# Patient Record
Sex: Female | Born: 1945 | Race: White | Hispanic: No | Marital: Married | Smoking: Never smoker
Health system: Southern US, Community
[De-identification: ages and names within clinical notes are randomized; demographics above are authoritative.]

## PROBLEM LIST (undated history)

## (undated) DIAGNOSIS — E119 Type 2 diabetes mellitus without complications: Secondary | ICD-10-CM

## (undated) DIAGNOSIS — E785 Hyperlipidemia, unspecified: Secondary | ICD-10-CM

## (undated) DIAGNOSIS — K219 Gastro-esophageal reflux disease without esophagitis: Secondary | ICD-10-CM

## (undated) DIAGNOSIS — I10 Essential (primary) hypertension: Secondary | ICD-10-CM

## (undated) DIAGNOSIS — N289 Disorder of kidney and ureter, unspecified: Secondary | ICD-10-CM

## (undated) HISTORY — PX: VAGINAL HYSTERECTOMY: SUR661

## (undated) HISTORY — DX: Hyperlipidemia, unspecified: E78.5

## (undated) HISTORY — DX: Essential (primary) hypertension: I10

## (undated) HISTORY — DX: Gastro-esophageal reflux disease without esophagitis: K21.9

## (undated) HISTORY — PX: FOOT SURGERY: SHX648

## (undated) HISTORY — DX: Type 2 diabetes mellitus without complications: E11.9

## (undated) HISTORY — PX: INCONTINENCE SURGERY: SHX676

---

## 2005-01-09 ENCOUNTER — Ambulatory Visit: Payer: Self-pay | Admitting: Oncology

## 2005-01-10 ENCOUNTER — Other Ambulatory Visit: Payer: Self-pay

## 2005-01-11 ENCOUNTER — Ambulatory Visit: Payer: Self-pay | Admitting: Obstetrics and Gynecology

## 2005-01-12 ENCOUNTER — Ambulatory Visit: Payer: Self-pay | Admitting: Obstetrics and Gynecology

## 2005-01-20 ENCOUNTER — Ambulatory Visit: Payer: Self-pay | Admitting: Oncology

## 2005-05-25 ENCOUNTER — Ambulatory Visit: Payer: Self-pay | Admitting: Gastroenterology

## 2006-01-08 ENCOUNTER — Ambulatory Visit: Payer: Self-pay | Admitting: Oncology

## 2006-01-20 ENCOUNTER — Ambulatory Visit: Payer: Self-pay | Admitting: Oncology

## 2006-08-10 ENCOUNTER — Other Ambulatory Visit: Payer: Self-pay

## 2006-08-10 ENCOUNTER — Emergency Department: Payer: Self-pay | Admitting: Emergency Medicine

## 2007-01-06 ENCOUNTER — Ambulatory Visit: Payer: Self-pay | Admitting: Oncology

## 2007-01-21 ENCOUNTER — Ambulatory Visit: Payer: Self-pay | Admitting: Oncology

## 2007-07-24 HISTORY — PX: COLONOSCOPY: SHX174

## 2007-08-14 ENCOUNTER — Ambulatory Visit: Payer: Self-pay | Admitting: Family Medicine

## 2008-01-21 ENCOUNTER — Ambulatory Visit: Payer: Self-pay | Admitting: Internal Medicine

## 2008-01-30 ENCOUNTER — Ambulatory Visit: Payer: Self-pay | Admitting: Internal Medicine

## 2008-02-21 ENCOUNTER — Ambulatory Visit: Payer: Self-pay | Admitting: Internal Medicine

## 2008-07-23 ENCOUNTER — Ambulatory Visit: Payer: Self-pay | Admitting: Internal Medicine

## 2008-07-25 ENCOUNTER — Emergency Department: Payer: Self-pay | Admitting: Emergency Medicine

## 2008-07-28 ENCOUNTER — Ambulatory Visit: Payer: Self-pay | Admitting: Internal Medicine

## 2008-08-17 ENCOUNTER — Ambulatory Visit: Payer: Self-pay

## 2008-08-23 ENCOUNTER — Ambulatory Visit: Payer: Self-pay | Admitting: Internal Medicine

## 2008-09-02 ENCOUNTER — Ambulatory Visit: Payer: Self-pay | Admitting: Family Medicine

## 2009-01-20 ENCOUNTER — Ambulatory Visit: Payer: Self-pay | Admitting: Internal Medicine

## 2009-02-08 ENCOUNTER — Ambulatory Visit: Payer: Self-pay | Admitting: Internal Medicine

## 2009-02-20 ENCOUNTER — Ambulatory Visit: Payer: Self-pay | Admitting: Internal Medicine

## 2009-07-23 ENCOUNTER — Ambulatory Visit: Payer: Self-pay | Admitting: Internal Medicine

## 2009-08-11 ENCOUNTER — Ambulatory Visit: Payer: Self-pay | Admitting: Internal Medicine

## 2009-08-18 ENCOUNTER — Ambulatory Visit: Payer: Self-pay

## 2009-08-23 ENCOUNTER — Ambulatory Visit: Payer: Self-pay | Admitting: Internal Medicine

## 2009-09-20 ENCOUNTER — Ambulatory Visit: Payer: Self-pay | Admitting: Internal Medicine

## 2010-03-01 ENCOUNTER — Ambulatory Visit: Payer: Self-pay | Admitting: Internal Medicine

## 2010-03-23 ENCOUNTER — Ambulatory Visit: Payer: Self-pay | Admitting: Internal Medicine

## 2010-08-30 ENCOUNTER — Ambulatory Visit: Payer: Self-pay | Admitting: Family Medicine

## 2010-09-01 ENCOUNTER — Ambulatory Visit: Payer: Self-pay | Admitting: Internal Medicine

## 2010-09-21 ENCOUNTER — Ambulatory Visit: Payer: Self-pay | Admitting: Internal Medicine

## 2011-08-01 ENCOUNTER — Ambulatory Visit: Payer: Self-pay | Admitting: Internal Medicine

## 2011-08-01 LAB — CBC CANCER CENTER
Basophil %: 1.2 %
Eosinophil %: 3.4 %
HGB: 13.5 g/dL (ref 12.0–16.0)
Lymphocyte #: 1.7 x10 3/mm (ref 1.0–3.6)
MCH: 30.7 pg (ref 26.0–34.0)
Monocyte #: 0.6 x10 3/mm (ref 0.0–0.7)
Monocyte %: 9.5 %
Neutrophil %: 60.8 %
Platelet: 121 x10 3/mm — ABNORMAL LOW (ref 150–440)

## 2011-08-24 ENCOUNTER — Ambulatory Visit: Payer: Self-pay | Admitting: Internal Medicine

## 2011-09-05 ENCOUNTER — Ambulatory Visit: Payer: Self-pay | Admitting: Family Medicine

## 2012-07-31 ENCOUNTER — Ambulatory Visit: Payer: Self-pay | Admitting: Internal Medicine

## 2012-08-04 LAB — CBC CANCER CENTER
Basophil #: 0.1 x10 3/mm (ref 0.0–0.1)
Lymphocyte #: 1.6 x10 3/mm (ref 1.0–3.6)
Lymphocyte %: 23.8 %
MCH: 29.8 pg (ref 26.0–34.0)
MCHC: 34.3 g/dL (ref 32.0–36.0)
MCV: 87 fL (ref 80–100)
RBC: 4.88 10*6/uL (ref 3.80–5.20)
RDW: 12.9 % (ref 11.5–14.5)
WBC: 6.7 x10 3/mm (ref 3.6–11.0)

## 2012-08-23 ENCOUNTER — Ambulatory Visit: Payer: Self-pay | Admitting: Internal Medicine

## 2012-10-14 ENCOUNTER — Ambulatory Visit: Payer: Self-pay | Admitting: Family Medicine

## 2013-04-21 ENCOUNTER — Ambulatory Visit: Payer: Self-pay | Admitting: Family Medicine

## 2013-07-09 ENCOUNTER — Ambulatory Visit: Payer: Self-pay | Admitting: Family Medicine

## 2013-08-06 ENCOUNTER — Ambulatory Visit: Payer: Self-pay | Admitting: Internal Medicine

## 2013-08-06 LAB — HEPATIC FUNCTION PANEL A (ARMC)
ALK PHOS: 79 U/L
Albumin: 3.5 g/dL (ref 3.4–5.0)
BILIRUBIN TOTAL: 0.6 mg/dL (ref 0.2–1.0)
Bilirubin, Direct: 0.3 mg/dL — ABNORMAL HIGH (ref 0.00–0.20)
SGOT(AST): 19 U/L (ref 15–37)
SGPT (ALT): 34 U/L (ref 12–78)
Total Protein: 7.7 g/dL (ref 6.4–8.2)

## 2013-08-06 LAB — CREATININE, SERUM: Creatinine: 0.87 mg/dL (ref 0.60–1.30)

## 2013-08-06 LAB — CBC CANCER CENTER
BASOS ABS: 0.1 x10 3/mm (ref 0.0–0.1)
Basophil %: 1.3 %
Eosinophil #: 0.2 x10 3/mm (ref 0.0–0.7)
Eosinophil %: 2.5 %
HCT: 42.4 % (ref 35.0–47.0)
HGB: 14.3 g/dL (ref 12.0–16.0)
LYMPHS ABS: 0.9 x10 3/mm — AB (ref 1.0–3.6)
LYMPHS PCT: 14.4 %
MCH: 29.7 pg (ref 26.0–34.0)
MCHC: 33.6 g/dL (ref 32.0–36.0)
MCV: 88 fL (ref 80–100)
MONOS PCT: 8.5 %
Monocyte #: 0.5 x10 3/mm (ref 0.2–0.9)
Neutrophil #: 4.7 x10 3/mm (ref 1.4–6.5)
Neutrophil %: 73.3 %
Platelet: 98 x10 3/mm — ABNORMAL LOW (ref 150–440)
RBC: 4.8 10*6/uL (ref 3.80–5.20)
RDW: 13 % (ref 11.5–14.5)
WBC: 6.4 x10 3/mm (ref 3.6–11.0)

## 2013-08-23 ENCOUNTER — Ambulatory Visit: Payer: Self-pay | Admitting: Internal Medicine

## 2013-10-15 ENCOUNTER — Ambulatory Visit: Payer: Self-pay | Admitting: Family Medicine

## 2014-08-05 ENCOUNTER — Ambulatory Visit: Payer: Self-pay | Admitting: Internal Medicine

## 2014-10-08 DIAGNOSIS — E784 Other hyperlipidemia: Secondary | ICD-10-CM | POA: Diagnosis not present

## 2014-10-08 DIAGNOSIS — E8881 Metabolic syndrome: Secondary | ICD-10-CM | POA: Diagnosis not present

## 2014-10-08 DIAGNOSIS — K219 Gastro-esophageal reflux disease without esophagitis: Secondary | ICD-10-CM | POA: Diagnosis not present

## 2014-10-08 DIAGNOSIS — I10 Essential (primary) hypertension: Secondary | ICD-10-CM | POA: Diagnosis not present

## 2014-10-08 DIAGNOSIS — E119 Type 2 diabetes mellitus without complications: Secondary | ICD-10-CM | POA: Diagnosis not present

## 2014-10-19 ENCOUNTER — Ambulatory Visit: Payer: Self-pay | Admitting: Family Medicine

## 2014-10-19 DIAGNOSIS — Z1231 Encounter for screening mammogram for malignant neoplasm of breast: Secondary | ICD-10-CM | POA: Diagnosis not present

## 2015-03-07 ENCOUNTER — Encounter: Payer: Self-pay | Admitting: Family Medicine

## 2015-03-07 ENCOUNTER — Ambulatory Visit (INDEPENDENT_AMBULATORY_CARE_PROVIDER_SITE_OTHER): Payer: Medicare Other | Admitting: Family Medicine

## 2015-03-07 VITALS — BP 130/80 | HR 68 | Ht 68.0 in | Wt 211.0 lb

## 2015-03-07 DIAGNOSIS — K469 Unspecified abdominal hernia without obstruction or gangrene: Secondary | ICD-10-CM | POA: Diagnosis not present

## 2015-03-07 DIAGNOSIS — K458 Other specified abdominal hernia without obstruction or gangrene: Secondary | ICD-10-CM

## 2015-03-07 NOTE — Progress Notes (Signed)
Name: Frances Harmon   MRN: [id number]    DOB: 03/27/1946   Date:03/07/2015       Progress Note  Subjective  Chief Complaint  No chief complaint on file.   Abdominal Pain This is a new problem. The current episode started 1 to 4 weeks ago. The onset quality is gradual. The problem occurs daily. The problem has been unchanged. The pain is located in the suprapubic region. The patient is experiencing no pain ("knot"). Pertinent negatives include no anorexia, arthralgias, belching, constipation, diarrhea, dysuria, fever, flatus, frequency, headaches, hematochezia, hematuria, melena, myalgias, nausea, vomiting or weight loss. The pain is aggravated by certain positions (noted while standing). Prior diagnostic workup includes surgery. Her past medical history is significant for abdominal surgery. There is no history of colon cancer, Crohn's disease, GERD, PUD or ulcerative colitis. hysterectomy    No problem-specific assessment & plan notes found for this encounter.   Past Medical History  Diagnosis Date  . GERD (gastroesophageal reflux disease)   . Diabetes mellitus without complication   . Hypertension   . Hyperlipidemia     Past Surgical History  Procedure Laterality Date  . Vaginal hysterectomy    . Foot surgery Bilateral     Bunion removal  . Incontinence surgery    . Colonoscopy  2009    repeat in 5 years- Dr Bary Leriche    Family History  Problem Relation Age of Onset  . Hypertension Brother   . Cancer Daughter     Social History   Social History  . Marital Status: Married    Spouse Name: N/A  . Number of Children: N/A  . Years of Education: N/A   Occupational History  . Not on file.   Social History Main Topics  . Smoking status: Never Smoker   . Smokeless tobacco: Not on file  . Alcohol Use: No  . Drug Use: No  . Sexual Activity: No   Other Topics Concern  . Not on file   Social History Narrative  . No narrative on file    No Known  Allergies   Review of Systems  Constitutional: Negative for fever, chills, weight loss and malaise/fatigue.  HENT: Negative for ear discharge, ear pain and sore throat.   Eyes: Negative for blurred vision.  Respiratory: Negative for cough, sputum production, shortness of breath and wheezing.   Cardiovascular: Negative for chest pain, palpitations and leg swelling.  Gastrointestinal: Negative for heartburn, nausea, vomiting, abdominal pain, diarrhea, constipation, blood in stool, melena, hematochezia, anorexia and flatus.       Feels "knot" in lower abdomin when standing  Genitourinary: Negative for dysuria, urgency, frequency and hematuria.  Musculoskeletal: Negative for myalgias, back pain, joint pain, arthralgias and neck pain.  Skin: Negative for rash.  Neurological: Negative for dizziness, tingling, sensory change, focal weakness and headaches.  Endo/Heme/Allergies: Negative for environmental allergies and polydipsia. Does not bruise/bleed easily.  Psychiatric/Behavioral: Negative for depression and suicidal ideas. The patient is not nervous/anxious and does not have insomnia.      Objective  Filed Vitals:   03/07/15 1500  BP: 130/80  Pulse: 68  Height: 5\' 8"  (1.727 m)  Weight: 211 lb (95.709 kg)    Physical Exam  Constitutional: She is well-developed, well-nourished, and in no distress. No distress.  HENT:  Head: Normocephalic and atraumatic.  Right Ear: External ear normal.  Left Ear: External ear normal.  Nose: Nose normal.  Mouth/Throat: Oropharynx is clear and moist.  Eyes: Conjunctivae and EOM are  normal. Pupils are equal, round, and reactive to light. Right eye exhibits no discharge. Left eye exhibits no discharge.  Neck: Normal range of motion. Neck supple. No JVD present. No thyromegaly present.  Cardiovascular: Normal rate, regular rhythm, normal heart sounds and intact distal pulses.  Exam reveals no gallop and no friction rub.   No murmur  heard. Pulmonary/Chest: Effort normal and breath sounds normal.  Abdominal: Soft. Bowel sounds are normal. She exhibits no mass. There is no tenderness. There is no guarding.  Palpable firmness with standing and valsalva  Musculoskeletal: Normal range of motion. She exhibits no edema.  Lymphadenopathy:    She has no cervical adenopathy.  Neurological: She is alert. She has normal reflexes.  Skin: Skin is warm and dry. She is not diaphoretic.  Psychiatric: Mood and affect normal.  Nursing note and vitals reviewed.     Assessment & Plan  Problem List Items Addressed This Visit    None    Visit Diagnoses    Other abdominal hernia    -  Primary    suprapubic area    Relevant Orders    Ambulatory referral to Gynecology         Dr. Otilio Miu [facility]  03/07/2015

## 2015-03-07 NOTE — Patient Instructions (Signed)

## 2015-03-22 DIAGNOSIS — K439 Ventral hernia without obstruction or gangrene: Secondary | ICD-10-CM | POA: Diagnosis not present

## 2015-03-29 DIAGNOSIS — K439 Ventral hernia without obstruction or gangrene: Secondary | ICD-10-CM | POA: Diagnosis not present

## 2015-03-30 ENCOUNTER — Other Ambulatory Visit: Payer: Self-pay | Admitting: Surgery

## 2015-03-30 DIAGNOSIS — R1084 Generalized abdominal pain: Secondary | ICD-10-CM

## 2015-03-30 DIAGNOSIS — K439 Ventral hernia without obstruction or gangrene: Secondary | ICD-10-CM

## 2015-04-06 ENCOUNTER — Ambulatory Visit
Admission: RE | Admit: 2015-04-06 | Discharge: 2015-04-06 | Disposition: A | Discharge status: Home / Self Care | Payer: Medicare Other | Source: Ambulatory Visit | Attending: Surgery | Admitting: Surgery

## 2015-04-06 DIAGNOSIS — I700 Atherosclerosis of aorta: Secondary | ICD-10-CM | POA: Diagnosis not present

## 2015-04-06 DIAGNOSIS — K5730 Diverticulosis of large intestine without perforation or abscess without bleeding: Secondary | ICD-10-CM | POA: Insufficient documentation

## 2015-04-06 DIAGNOSIS — K439 Ventral hernia without obstruction or gangrene: Secondary | ICD-10-CM | POA: Diagnosis not present

## 2015-04-06 DIAGNOSIS — R1084 Generalized abdominal pain: Secondary | ICD-10-CM | POA: Diagnosis not present

## 2015-04-06 MED ORDER — IOHEXOL 300 MG/ML  SOLN
100.0000 mL | Freq: Once | INTRAMUSCULAR | Status: AC | PRN
  Administered 2015-04-06: 100 mL via INTRAVENOUS

## 2015-04-16 ENCOUNTER — Other Ambulatory Visit: Payer: Self-pay | Admitting: Family Medicine

## 2015-04-16 DIAGNOSIS — E119 Type 2 diabetes mellitus without complications: Secondary | ICD-10-CM

## 2015-04-28 ENCOUNTER — Ambulatory Visit (INDEPENDENT_AMBULATORY_CARE_PROVIDER_SITE_OTHER): Payer: Medicare Other | Admitting: Family Medicine

## 2015-04-28 ENCOUNTER — Encounter: Payer: Self-pay | Admitting: Family Medicine

## 2015-04-28 ENCOUNTER — Encounter
Admission: RE | Admit: 2015-04-28 | Discharge: 2015-04-28 | Disposition: A | Discharge status: Home / Self Care | Payer: Medicare Other | Source: Ambulatory Visit | Attending: Surgery | Admitting: Surgery

## 2015-04-28 ENCOUNTER — Other Ambulatory Visit: Payer: Self-pay

## 2015-04-28 VITALS — BP 122/80 | HR 70 | Ht 68.0 in | Wt 204.0 lb

## 2015-04-28 DIAGNOSIS — K439 Ventral hernia without obstruction or gangrene: Secondary | ICD-10-CM | POA: Diagnosis not present

## 2015-04-28 DIAGNOSIS — K219 Gastro-esophageal reflux disease without esophagitis: Secondary | ICD-10-CM

## 2015-04-28 DIAGNOSIS — E119 Type 2 diabetes mellitus without complications: Secondary | ICD-10-CM | POA: Diagnosis not present

## 2015-04-28 DIAGNOSIS — E785 Hyperlipidemia, unspecified: Secondary | ICD-10-CM

## 2015-04-28 DIAGNOSIS — Z01818 Encounter for other preprocedural examination: Secondary | ICD-10-CM | POA: Diagnosis not present

## 2015-04-28 DIAGNOSIS — E559 Vitamin D deficiency, unspecified: Secondary | ICD-10-CM | POA: Diagnosis not present

## 2015-04-28 DIAGNOSIS — Z23 Encounter for immunization: Secondary | ICD-10-CM | POA: Diagnosis not present

## 2015-04-28 DIAGNOSIS — I10 Essential (primary) hypertension: Secondary | ICD-10-CM

## 2015-04-28 DIAGNOSIS — E118 Type 2 diabetes mellitus with unspecified complications: Secondary | ICD-10-CM

## 2015-04-28 MED ORDER — GLIPIZIDE ER 2.5 MG PO TB24: 2.5000 mg | ORAL_TABLET | Freq: Every day | ORAL | Status: DC

## 2015-04-28 MED ORDER — ATORVASTATIN CALCIUM 10 MG PO TABS: 10.0000 mg | ORAL_TABLET | Freq: Every day | ORAL | Status: DC

## 2015-04-28 MED ORDER — METFORMIN HCL 500 MG PO TABS: ORAL_TABLET | ORAL | Status: DC

## 2015-04-28 MED ORDER — LOSARTAN POTASSIUM-HCTZ 100-25 MG PO TABS: 1.0000 | ORAL_TABLET | Freq: Every day | ORAL | Status: DC

## 2015-04-28 MED ORDER — OMEPRAZOLE 20 MG PO CPDR: 20.0000 mg | DELAYED_RELEASE_CAPSULE | Freq: Every day | ORAL | Status: DC

## 2015-04-28 NOTE — Progress Notes (Signed)
Name: Frances Harmon   MRN: [id number]    DOB: February 19, 1946   Date:04/28/2015       Progress Note  Subjective  Chief Complaint  Chief Complaint  Patient presents with  . Gastrophageal Reflux  . Hypertension  . Hyperlipidemia  . Diabetes    Gastrophageal Reflux She complains of heartburn. She reports no abdominal pain, no belching, no chest pain, no choking, no coughing, no dysphagia, no early satiety, no globus sensation, no hoarse voice, no nausea, no sore throat, no stridor, no tooth decay, no water brash or no wheezing. This is a chronic problem. The current episode started more than 1 year ago. The problem occurs rarely. The problem has been gradually improving. The heartburn duration is less than a minute. The heartburn is of mild intensity. The heartburn does not wake her from sleep. The heartburn does not limit her activity. The symptoms are aggravated by certain foods. Pertinent negatives include no anemia, fatigue, melena, muscle weakness, orthopnea or weight loss. There are no known risk factors. She has tried a PPI for the symptoms. The treatment provided moderate relief.  Hypertension This is a chronic problem. The current episode started more than 1 year ago. The problem has been gradually improving since onset. The problem is controlled. Pertinent negatives include no anxiety, blurred vision, chest pain, headaches, malaise/fatigue, neck pain, orthopnea, palpitations, peripheral edema, PND, shortness of breath or sweats. There are no associated agents to hypertension. Past treatments include angiotensin blockers and diuretics. The current treatment provides moderate improvement. There are no compliance problems.  There is no history of angina, kidney disease, CAD/MI, CVA, heart failure, left ventricular hypertrophy, PVD, renovascular disease or retinopathy. There is no history of chronic renal disease.  Hyperlipidemia This is a recurrent problem. The current episode started more than  1 year ago. The problem is controlled. Recent lipid tests were reviewed and are normal. She has no history of chronic renal disease, diabetes, hypothyroidism, liver disease, obesity or nephrotic syndrome. There are no known factors aggravating her hyperlipidemia. Pertinent negatives include no chest pain, focal weakness, myalgias or shortness of breath. Current antihyperlipidemic treatment includes statins. The current treatment provides moderate improvement of lipids. There are no compliance problems.   Diabetes She presents for her follow-up diabetic visit. She has type 2 diabetes mellitus. Her disease course has been stable. Pertinent negatives for hypoglycemia include no dizziness, headaches, nervousness/anxiousness, sleepiness or sweats. Pertinent negatives for diabetes include no blurred vision, no chest pain, no fatigue, no foot paresthesias, no foot ulcerations, no polydipsia, no polyphagia, no polyuria, no visual change, no weakness and no weight loss. There are no hypoglycemic complications. Symptoms are stable. Pertinent negatives for diabetic complications include no autonomic neuropathy, CVA, heart disease, nephropathy, peripheral neuropathy, PVD or retinopathy. Current diabetic treatment includes oral agent (dual therapy). She is compliant with treatment all of the time. Her weight is stable. She is following a generally healthy diet. She has not had a previous visit with a dietitian. She participates in exercise intermittently. An ACE inhibitor/angiotensin II receptor blocker is being taken. She does not see a podiatrist.Eye exam is current.    No problem-specific assessment & plan notes found for this encounter.   Past Medical History  Diagnosis Date  . GERD (gastroesophageal reflux disease)   . Diabetes mellitus without complication ([place])   . Hypertension   . Hyperlipidemia     Past Surgical History  Procedure Laterality Date  . Vaginal hysterectomy    . Foot surgery Bilateral  Bunion removal  . Incontinence surgery    . Colonoscopy  2009    repeat in 5 years- Dr Bary Leriche    Family History  Problem Relation Age of Onset  . Hypertension Brother   . Cancer Daughter     Social History   Social History  . Marital Status: Married    Spouse Name: N/A  . Number of Children: N/A  . Years of Education: N/A   Occupational History  . Not on file.   Social History Main Topics  . Smoking status: Never Smoker   . Smokeless tobacco: Never Used  . Alcohol Use: No  . Drug Use: No  . Sexual Activity: No   Other Topics Concern  . Not on file   Social History Narrative    No Known Allergies   Review of Systems  Constitutional: Negative for fever, chills, weight loss, malaise/fatigue and fatigue.  HENT: Negative for ear discharge, ear pain, hoarse voice and sore throat.   Eyes: Negative for blurred vision.  Respiratory: Negative for cough, sputum production, choking, shortness of breath and wheezing.   Cardiovascular: Negative for chest pain, palpitations, orthopnea, leg swelling and PND.  Gastrointestinal: Positive for heartburn. Negative for dysphagia, nausea, abdominal pain, diarrhea, constipation, blood in stool and melena.  Genitourinary: Negative for dysuria, urgency, frequency and hematuria.  Musculoskeletal: Negative for myalgias, back pain, joint pain, muscle weakness and neck pain.  Skin: Negative for rash.  Neurological: Negative for dizziness, tingling, sensory change, focal weakness, weakness and headaches.  Endo/Heme/Allergies: Negative for environmental allergies, polydipsia and polyphagia. Does not bruise/bleed easily.  Psychiatric/Behavioral: Negative for depression and suicidal ideas. The patient is not nervous/anxious and does not have insomnia.      Objective  Filed Vitals:   04/28/15 1551  BP: 122/80  Pulse: 70  Height: 5\' 8"  (1.727 m)  Weight: 204 lb (92.534 kg)    Physical Exam  Constitutional: She is well-developed,  well-nourished, and in no distress. No distress.  HENT:  Head: Normocephalic and atraumatic.  Right Ear: External ear normal.  Left Ear: External ear normal.  Nose: Nose normal.  Mouth/Throat: Oropharynx is clear and moist.  Eyes: Conjunctivae and EOM are normal. Pupils are equal, round, and reactive to light. Right eye exhibits no discharge. Left eye exhibits no discharge.  Neck: Normal range of motion. Neck supple. No JVD present. No thyromegaly present.  Cardiovascular: Normal rate, regular rhythm, normal heart sounds and intact distal pulses.  Exam reveals no gallop and no friction rub.   No murmur heard. Pulmonary/Chest: Effort normal and breath sounds normal.  Abdominal: Soft. Bowel sounds are normal. She exhibits no mass. There is no tenderness. There is no guarding.  Musculoskeletal: Normal range of motion. She exhibits no edema.  Lymphadenopathy:    She has no cervical adenopathy.  Neurological: She is alert. She has normal reflexes.  Skin: Skin is warm and dry. She is not diaphoretic.  Psychiatric: Mood and affect normal.      Assessment & Plan  Problem List Items Addressed This Visit      Cardiovascular and Mediastinum   Essential hypertension   Relevant Medications   atorvastatin (LIPITOR) 10 MG tablet   losartan-hydrochlorothiazide (HYZAAR) 100-25 MG tablet   Other Relevant Orders   Renal Function Panel     Digestive   Esophageal reflux   Relevant Medications   omeprazole (PRILOSEC) 20 MG capsule     Endocrine   Type 2 diabetes mellitus with complication, without long-term current use  of insulin ([place]) - Primary   Relevant Medications   atorvastatin (LIPITOR) 10 MG tablet   glipiZIDE (GLUCOTROL XL) 2.5 MG 24 hr tablet   losartan-hydrochlorothiazide (HYZAAR) 100-25 MG tablet   metFORMIN (GLUCOPHAGE) 500 MG tablet   Other Relevant Orders   Renal Function Panel   HgB A1c     Other   Hyperlipidemia   Relevant Medications   atorvastatin (LIPITOR) 10 MG  tablet   losartan-hydrochlorothiazide (HYZAAR) 100-25 MG tablet   Other Relevant Orders   Lipid Profile   Vitamin D deficiency    Other Visit Diagnoses    Need for influenza vaccination        Relevant Orders    Flu Vaccine QUAD 36+ mos PF IM (Fluarix & Fluzone Quad PF) (Completed)    Type 2 diabetes mellitus without complication, without long-term current use of insulin ([facility])        Relevant Medications    atorvastatin (LIPITOR) 10 MG tablet    glipiZIDE (GLUCOTROL XL) 2.5 MG 24 hr tablet    losartan-hydrochlorothiazide (HYZAAR) 100-25 MG tablet    metFORMIN (GLUCOPHAGE) 500 MG tablet         Dr. Deanna Jones [facility]  04/28/2015

## 2015-04-28 NOTE — Patient Instructions (Signed)
  Your procedure is scheduled on: 05/06/15 Report to Day Surgery. To find out your arrival time please call [phone] between 1PM - 3PM on 05/05/15.  Remember: Instructions that are not followed completely may result in serious medical risk, up to and including death, or upon the discretion of your surgeon and anesthesiologist your surgery may need to be rescheduled.    _x_ 1. Do not eat food or drink liquids after midnight. No gum chewing or hard candies.     _x___ 2. No Alcohol for 24 hours before or after surgery.   ____ 3. Bring all medications with you on the day of surgery if instructed.    _x___ 4. Notify your doctor if there is any change in your medical condition     (cold, fever, infections).     Do not wear jewelry, make-up, hairpins, clips or nail polish.  Do not wear lotions, powders, or perfumes. You may wear deodorant.  Do not shave 48 hours prior to surgery. Men may shave face and neck.  Do not bring valuables to the hospital.    [facility] is not responsible for any belongings or valuables.               Contacts, dentures or bridgework may not be worn into surgery.  Leave your suitcase in the car. After surgery it may be brought to your room.  For patients admitted to the hospital, discharge time is determined by your                treatment team.   Patients discharged the day of surgery will not be allowed to drive home.   Please read over the following fact sheets that you were given:   Surgical Site Infection Prevention   ____ Take these medicines the morning of surgery with A SIP OF WATER:    1. omeprazole  2.   3.   4.  5.  6.  ____ Fleet Enema (as directed)   ___x_ Use CHG Soap as directed  ____ Use inhalers on the day of surgery  _x___ Stop metformin 2 days prior to surgery    ____ Take 1/2 of usual insulin dose the night before surgery and none on the morning of surgery.   ____ Stop Coumadin/Plavix/aspirin on   ____ Stop  Anti-inflammatories on   ____ Stop supplements until after surgery.    ____ Bring C-Pap to the hospital.

## 2015-04-29 LAB — HEMOGLOBIN A1C
ESTIMATED AVERAGE GLUCOSE: 123 mg/dL
Hgb A1c MFr Bld: 5.9 % — ABNORMAL HIGH (ref 4.8–5.6)

## 2015-04-29 LAB — LIPID PANEL
CHOL/HDL RATIO: 2.7 ratio (ref 0.0–4.4)
Cholesterol, Total: 131 mg/dL (ref 100–199)
HDL: 48 mg/dL (ref >39)
LDL CALC: 58 mg/dL (ref 0–99)
Triglycerides: 125 mg/dL (ref 0–149)
VLDL Cholesterol Cal: 25 mg/dL (ref 5–40)

## 2015-04-29 LAB — RENAL FUNCTION PANEL
Albumin: 4 g/dL (ref 3.6–4.8)
BUN / CREAT RATIO: 17 (ref 11–26)
BUN: 12 mg/dL (ref 8–27)
CALCIUM: 9.3 mg/dL (ref 8.7–10.3)
CO2: 26 mmol/L (ref 18–29)
CREATININE: 0.72 mg/dL (ref 0.57–1.00)
Chloride: 102 mmol/L (ref 97–108)
GFR calc Af Amer: 100 mL/min/{1.73_m2} (ref >59)
GFR, EST NON AFRICAN AMERICAN: 87 mL/min/{1.73_m2} (ref >59)
GLUCOSE: 108 mg/dL — AB (ref 65–99)
POTASSIUM: 4.3 mmol/L (ref 3.5–5.2)
Phosphorus: 3.1 mg/dL (ref 2.5–4.5)
SODIUM: 142 mmol/L (ref 134–144)

## 2015-05-06 ENCOUNTER — Ambulatory Visit
Admission: RE | Admit: 2015-05-06 | Discharge: 2015-05-06 | Disposition: A | Discharge status: Home / Self Care | Payer: Medicare Other | Source: Ambulatory Visit | Attending: Surgery | Admitting: Surgery

## 2015-05-06 ENCOUNTER — Ambulatory Visit: Payer: Medicare Other | Admitting: Anesthesiology

## 2015-05-06 ENCOUNTER — Encounter
Admission: RE | Disposition: A | Discharge status: Home / Self Care | Payer: Self-pay | Source: Ambulatory Visit | Attending: Surgery

## 2015-05-06 DIAGNOSIS — K219 Gastro-esophageal reflux disease without esophagitis: Secondary | ICD-10-CM | POA: Diagnosis not present

## 2015-05-06 DIAGNOSIS — I10 Essential (primary) hypertension: Secondary | ICD-10-CM | POA: Diagnosis not present

## 2015-05-06 DIAGNOSIS — Z6830 Body mass index (BMI) 30.0-30.9, adult: Secondary | ICD-10-CM | POA: Insufficient documentation

## 2015-05-06 DIAGNOSIS — K439 Ventral hernia without obstruction or gangrene: Secondary | ICD-10-CM | POA: Diagnosis not present

## 2015-05-06 DIAGNOSIS — E119 Type 2 diabetes mellitus without complications: Secondary | ICD-10-CM | POA: Diagnosis not present

## 2015-05-06 DIAGNOSIS — E669 Obesity, unspecified: Secondary | ICD-10-CM | POA: Insufficient documentation

## 2015-05-06 HISTORY — PX: VENTRAL HERNIA REPAIR: SHX424

## 2015-05-06 LAB — GLUCOSE, CAPILLARY
GLUCOSE-CAPILLARY: 109 mg/dL — AB (ref 65–99)
GLUCOSE-CAPILLARY: 113 mg/dL — AB (ref 65–99)

## 2015-05-06 SURGERY — REPAIR, HERNIA, VENTRAL
Anesthesia: General

## 2015-05-06 MED ORDER — FENTANYL CITRATE (PF) 100 MCG/2ML IJ SOLN
INTRAMUSCULAR | Status: DC | PRN
  Administered 2015-05-06: 100 ug via INTRAVENOUS

## 2015-05-06 MED ORDER — MIDAZOLAM HCL 2 MG/2ML IJ SOLN
INTRAMUSCULAR | Status: DC | PRN
  Administered 2015-05-06: 2 mg via INTRAVENOUS

## 2015-05-06 MED ORDER — ROCURONIUM BROMIDE 100 MG/10ML IV SOLN
INTRAVENOUS | Status: DC | PRN
  Administered 2015-05-06: 10 mg via INTRAVENOUS
  Administered 2015-05-06: 20 mg via INTRAVENOUS

## 2015-05-06 MED ORDER — CEFAZOLIN SODIUM-DEXTROSE 2-3 GM-% IV SOLR
INTRAVENOUS | Status: AC
  Filled 2015-05-06: qty 50

## 2015-05-06 MED ORDER — HYDROMORPHONE HCL 1 MG/ML IJ SOLN: 0.2500 mg | INTRAMUSCULAR | Status: DC | PRN

## 2015-05-06 MED ORDER — BUPIVACAINE-EPINEPHRINE 0.5% -1:200000 IJ SOLN
INTRAMUSCULAR | Status: DC | PRN
  Administered 2015-05-06: 17 mL

## 2015-05-06 MED ORDER — SUCCINYLCHOLINE CHLORIDE 20 MG/ML IJ SOLN
INTRAMUSCULAR | Status: DC | PRN
  Administered 2015-05-06: 20 mg via INTRAVENOUS
  Administered 2015-05-06: 100 mg via INTRAVENOUS

## 2015-05-06 MED ORDER — PROPOFOL 10 MG/ML IV BOLUS
INTRAVENOUS | Status: DC | PRN
  Administered 2015-05-06: 150 mg via INTRAVENOUS

## 2015-05-06 MED ORDER — CEFAZOLIN SODIUM-DEXTROSE 2-3 GM-% IV SOLR
2.0000 g | INTRAVENOUS | Status: AC
  Administered 2015-05-06: 2 g via INTRAVENOUS

## 2015-05-06 MED ORDER — BUPIVACAINE-EPINEPHRINE (PF) 0.5% -1:200000 IJ SOLN
INTRAMUSCULAR | Status: AC
  Filled 2015-05-06: qty 30

## 2015-05-06 MED ORDER — HYDROCODONE-ACETAMINOPHEN 5-325 MG PO TABS: 1.0000 | ORAL_TABLET | ORAL | Status: DC | PRN

## 2015-05-06 MED ORDER — EPHEDRINE SULFATE 50 MG/ML IJ SOLN
INTRAMUSCULAR | Status: DC | PRN
  Administered 2015-05-06 (×2): 5 mg via INTRAVENOUS
  Administered 2015-05-06: 10 mg via INTRAVENOUS
  Administered 2015-05-06: 5 mg via INTRAVENOUS

## 2015-05-06 MED ORDER — NEOSTIGMINE METHYLSULFATE 10 MG/10ML IV SOLN
INTRAVENOUS | Status: DC | PRN
  Administered 2015-05-06: 3 mg via INTRAVENOUS

## 2015-05-06 MED ORDER — SODIUM CHLORIDE 0.9 % IV SOLN
INTRAVENOUS | Status: DC
  Administered 2015-05-06: 07:00:00 via INTRAVENOUS

## 2015-05-06 MED ORDER — ONDANSETRON HCL 4 MG/2ML IJ SOLN: 4.0000 mg | Freq: Once | INTRAMUSCULAR | Status: DC | PRN

## 2015-05-06 MED ORDER — GLYCOPYRROLATE 0.2 MG/ML IJ SOLN
INTRAMUSCULAR | Status: DC | PRN
  Administered 2015-05-06: 0.6 mg via INTRAVENOUS

## 2015-05-06 MED ORDER — ONDANSETRON HCL 4 MG/2ML IJ SOLN
INTRAMUSCULAR | Status: DC | PRN
Start: 2015-05-06 — End: 2015-05-06
  Administered 2015-05-06: 4 mg via INTRAVENOUS

## 2015-05-06 MED ORDER — LIDOCAINE HCL (CARDIAC) 20 MG/ML IV SOLN
INTRAVENOUS | Status: DC | PRN
  Administered 2015-05-06: 60 mg via INTRAVENOUS

## 2015-05-06 SURGICAL SUPPLY — 23 items
CANISTER SUCT 1200ML W/VALVE (MISCELLANEOUS) ×2 IMPLANT
CHLORAPREP W/TINT 26ML (MISCELLANEOUS) ×2 IMPLANT
DRAPE LAPAROTOMY 100X77 ABD (DRAPES) ×2 IMPLANT
GAUZE SPONGE 4X4 12PLY STRL (GAUZE/BANDAGES/DRESSINGS) IMPLANT
GLOVE BIO SURGEON STRL SZ7.5 (GLOVE) ×2 IMPLANT
GOWN STRL REUS W/ TWL LRG LVL3 (GOWN DISPOSABLE) ×2 IMPLANT
GOWN STRL REUS W/TWL LRG LVL3 (GOWN DISPOSABLE) ×2
KIT RM TURNOVER STRD PROC AR (KITS) ×2 IMPLANT
LABEL OR SOLS (LABEL) ×2 IMPLANT
LIQUID BAND (GAUZE/BANDAGES/DRESSINGS) ×2 IMPLANT
MESH SYNTHETIC 4X6 SOFT BARD (Mesh General) ×1 IMPLANT
MESH SYNTHETIC SOFT BARD 4X6 (Mesh General) ×1 IMPLANT
NEEDLE HYPO 25X1 1.5 SAFETY (NEEDLE) ×2 IMPLANT
NS IRRIG 500ML POUR BTL (IV SOLUTION) ×2 IMPLANT
PACK BASIN MINOR ARMC (MISCELLANEOUS) ×2 IMPLANT
PAD GROUND ADULT SPLIT (MISCELLANEOUS) ×2 IMPLANT
STAPLER SKIN PROX 35W (STAPLE) IMPLANT
SUT CHROMIC 3 0 SH 27 (SUTURE) IMPLANT
SUT MNCRL 4-0 (SUTURE) ×1
SUT MNCRL 4-0 27XMFL (SUTURE) ×1
SUT SURGILON 0 30 BLK (SUTURE) ×2 IMPLANT
SUTURE MNCRL 4-0 27XMF (SUTURE) ×1 IMPLANT
SYRINGE 10CC LL (SYRINGE) ×2 IMPLANT

## 2015-05-06 NOTE — Anesthesia Postprocedure Evaluation (Signed)
  Anesthesia Post-op Note  Patient: Frances Harmon  Procedure(s) Performed: Procedure(s): HERNIA REPAIR VENTRAL ADULT (N/A)  Anesthesia type:General  Patient location: PACU  Post pain: Pain level controlled  Post assessment: Post-op Vital signs reviewed, Patient's Cardiovascular Status Stable, Respiratory Function Stable, Patent Airway and No signs of Nausea or vomiting  Post vital signs: Reviewed and stable  Last Vitals:  Filed Vitals:   05/06/15 0845  BP:   Pulse: 65  Temp:   Resp: 12    Level of consciousness: awake, alert  and patient cooperative  Complications: No apparent anesthesia complications

## 2015-05-06 NOTE — Anesthesia Procedure Notes (Signed)
Procedure Name: Intubation Date/Time: 05/06/2015 7:35 AM Performed by: Aline Brochure Pre-anesthesia Checklist: Patient identified, Emergency Drugs available, Suction available and Patient being monitored Patient Re-evaluated:Patient Re-evaluated prior to inductionOxygen Delivery Method: Circle system utilized Preoxygenation: Pre-oxygenation with 100% oxygen Intubation Type: IV induction Ventilation: Mask ventilation without difficulty Laryngoscope Size: Mac and 3 Grade View: Grade I Tube type: Oral Tube size: 7.0 mm Number of attempts: 1 Airway Equipment and Method: Patient positioned with wedge pillow and Stylet Placement Confirmation: ETT inserted through vocal cords under direct vision,  positive ETCO2 and breath sounds checked- equal and bilateral Secured at: 21 cm Tube secured with: Tape Dental Injury: Teeth and Oropharynx as per pre-operative assessment

## 2015-05-06 NOTE — Transfer of Care (Signed)
Immediate Anesthesia Transfer of Care Note  Patient: Frances Harmon  Procedure(s) Performed: Procedure(s): HERNIA REPAIR VENTRAL ADULT (N/A)  Patient Location: PACU  Anesthesia Type:General  Level of Consciousness: awake  Airway & Oxygen Therapy: Patient Spontanous Breathing and Patient connected to face mask oxygen  Post-op Assessment: Report given to RN and Post -op Vital signs reviewed and stable  Post vital signs: stable  Last Vitals:  Filed Vitals:   05/06/15 0834  BP: 148/65  Pulse: 80  Temp:   Resp: 17    Complications: No apparent anesthesia complications

## 2015-05-06 NOTE — Op Note (Signed)
OPERATIVE REPORT  PREOPERATIVE  DIAGNOSIS: . Ventral hernia  POSTOPERATIVE DIAGNOSIS: . Ventral hernia  PROCEDURE: . Ventral hernia repair  ANESTHESIA:  General  SURGEON: Rochel Brome  MD   INDICATIONS: . She reports recent bulging in the lower abdomen. A ventral hernia was demonstrated on physical exam and repair recommended for definitive treatment.  With the patient on the operating table in the supine position under general anesthesia the abdomen was prepared with ChloraPrep and draped in a sterile manner. A lower abdominal midline incision was made approximately 6 cm in length and carried down through subcutaneous tissues to encounter a ventral hernia sac the sac was approximately 3 cm in length and was dissected free from surrounding adipose tissue and dissected down to the fascial ring defect. The sac was dissected free from the fascial ring defect. The properitoneal fat was separated from the fascia circumferentially extending back approximately 1 cm. A finger was introduced and palpated the surrounding fascia and found no other defect. The fascia did appear to be somewhat thin and did elected to use properitoneal Bard soft mesh. The mesh was cut to create an oval shape of some 1.5 x 2.5 cm in dimension. This was placed into the properitoneal plane and sutured to the overlying fascia with through and through 0 Surgilon sutures. The fascial defect was closed with a transversely oriented suture line of interrupted 0 Surgilon figure-of-eight sutures incorporating each suture into the mesh. The deep fascia and subcutaneous tissues were infiltrated with half percent Sensorcaine with epinephrine. It is noted that during the course of the procedure hemostasis was achieved with electrocautery and hemostasis was surgically intact. Subcutaneous tissues were approximated with 4-0 Monocryl. The skin was closed with a running 4-0 Monocryl subcuticular suture and LiquiBand. The patient tolerated surgery  satisfactorily and was prepared for transfer to the recovery room. Blood loss was less than 1 cc  Rochel Brome M.D.

## 2015-05-06 NOTE — Anesthesia Preprocedure Evaluation (Signed)
Anesthesia Evaluation  Patient identified by MRN, date of birth, ID band Patient awake    Reviewed: Allergy & Precautions, NPO status , Patient's Chart, lab work & pertinent test results  Airway Mallampati: II  TM Distance: >3 FB Neck ROM: Limited    Dental  (+) Teeth Intact   Pulmonary    Pulmonary exam normal        Cardiovascular Exercise Tolerance: Good hypertension, Pt. on medications and Pt. on home beta blockers Normal cardiovascular exam     Neuro/Psych    GI/Hepatic GERD  Medicated and Controlled,  Endo/Other  diabetes, Type 2BG 109.  Renal/GU      Musculoskeletal   Abdominal (+) + obese,  Abdomen: soft.    Peds  Hematology   Anesthesia Other Findings   Reproductive/Obstetrics                             Anesthesia Physical Anesthesia Plan  ASA: III  Anesthesia Plan: General   Post-op Pain Management:    Induction: Intravenous  Airway Management Planned: Oral ETT  Additional Equipment:   Intra-op Plan:   Post-operative Plan: Extubation in OR  Informed Consent: I have reviewed the patients History and Physical, chart, labs and discussed the procedure including the risks, benefits and alternatives for the proposed anesthesia with the patient or authorized representative who has indicated his/her understanding and acceptance.     Plan Discussed with: CRNA  Anesthesia Plan Comments:         Anesthesia Quick Evaluation

## 2015-05-06 NOTE — H&P (Signed)
  She reports no change in condition since office visit.  Site of ventral hernia identified and marked.  Discussed plan for surgery.

## 2015-05-06 NOTE — Discharge Instructions (Addendum)
Take Tylenol or Norco if needed for pain. May shower. Avoid straining and heavy lifting for 1 month.  AMBULATORY SURGERY  DISCHARGE INSTRUCTIONS   1) The drugs that you were given will stay in your system until tomorrow so for the next 24 hours you should not:  A) Drive an automobile B) Make any legal decisions C) Drink any alcoholic beverage   2) You may resume regular meals tomorrow.  Today it is better to start with liquids and gradually work up to solid foods.  You may eat anything you prefer, but it is better to start with liquids, then soup and crackers, and gradually work up to solid foods.   3) Please notify your doctor immediately if you have any unusual bleeding, trouble breathing, redness and pain at the surgery site, drainage, fever, or pain not relieved by medication.    4) Additional Instructions:        Please contact your physician with any problems or Same Day Surgery at [phone], Monday through Friday 6 am to 4 pm, or  at [facility] number at [phone].

## 2015-06-24 ENCOUNTER — Ambulatory Visit
Admission: EM | Admit: 2015-06-24 | Discharge: 2015-06-24 | Disposition: A | Discharge status: Home / Self Care | Payer: Medicare Other | Attending: Family Medicine | Admitting: Family Medicine

## 2015-06-24 ENCOUNTER — Encounter: Payer: Self-pay | Admitting: *Deleted

## 2015-06-24 DIAGNOSIS — R05 Cough: Secondary | ICD-10-CM | POA: Diagnosis not present

## 2015-06-24 DIAGNOSIS — R059 Cough, unspecified: Secondary | ICD-10-CM

## 2015-06-24 DIAGNOSIS — J0110 Acute frontal sinusitis, unspecified: Secondary | ICD-10-CM | POA: Diagnosis not present

## 2015-06-24 MED ORDER — AMOXICILLIN 875 MG PO TABS: 875.0000 mg | ORAL_TABLET | Freq: Two times a day (BID) | ORAL | Status: DC

## 2015-06-24 MED ORDER — GUAIFENESIN-CODEINE 100-10 MG/5ML PO SOLN: ORAL | Status: DC

## 2015-06-24 NOTE — ED Notes (Signed)
Patient started having symptoms of sinus drainage this past Tuesday with cough and sore throat starting on Wednesday. Patient reports yellow mucous coming from nose.

## 2015-06-24 NOTE — ED Provider Notes (Signed)
CSN: [id number]     Arrival date & time 06/24/15  1428 History   First MD Initiated Contact with Patient 06/24/15 1604     Chief Complaint  Patient presents with  . Nasal Congestion  . Sore Throat  . Cough   (Consider location/radiation/quality/duration/timing/severity/associated sxs/prior Treatment) Patient is a 69 y.o. female presenting with URI. The history is provided by the patient.  URI Presenting symptoms: congestion, facial pain, fever and rhinorrhea   Severity:  Moderate Onset quality:  Sudden Timing:  Constant Progression:  Worsening Chronicity:  New Relieved by:  None tried Ineffective treatments:  None tried Associated symptoms: headaches and sinus pain   Associated symptoms: no wheezing   Risk factors: diabetes mellitus     Past Medical History  Diagnosis Date  . GERD (gastroesophageal reflux disease)   . Diabetes mellitus without complication ([place])   . Hypertension   . Hyperlipidemia    Past Surgical History  Procedure Laterality Date  . Vaginal hysterectomy    . Foot surgery Bilateral     Bunion removal  . Incontinence surgery    . Colonoscopy  2009    repeat in 5 years- Dr Bary Leriche  . Ventral hernia repair N/A 05/06/2015    Procedure: HERNIA REPAIR VENTRAL ADULT;  Surgeon: Leonie Green, MD;  Location: [facility] ORS;  Service: General;  Laterality: N/A;   Family History  Problem Relation Age of Onset  . Hypertension Brother   . Cancer Daughter    Social History  Substance Use Topics  . Smoking status: Never Smoker   . Smokeless tobacco: Never Used  . Alcohol Use: No   OB History    No data available     Review of Systems  Constitutional: Positive for fever.  HENT: Positive for congestion and rhinorrhea.   Respiratory: Negative for wheezing.   Neurological: Positive for headaches.    Allergies  Review of patient's allergies indicates no known allergies.  Home Medications   Prior to Admission medications   Medication Sig Start Date  End Date Taking? Authorizing Provider  atorvastatin (LIPITOR) 10 MG tablet Take 1 tablet (10 mg total) by mouth daily at 6 (six) AM. 04/28/15  Yes Juline Patch, MD  Cholecalciferol (VITAMIN D3) 1000 UNITS CAPS Take by mouth daily.   Yes Historical Provider, MD  glipiZIDE (GLUCOTROL XL) 2.5 MG 24 hr tablet Take 1 tablet (2.5 mg total) by mouth daily at 6 (six) AM. 04/28/15  Yes Juline Patch, MD  HYDROcodone-acetaminophen (NORCO) 5-325 MG tablet Take 1-2 tablets by mouth every 4 (four) hours as needed for moderate pain. 05/06/15  Yes Leonie Green, MD  losartan-hydrochlorothiazide [facility]) 100-25 MG tablet Take 1 tablet by mouth daily at 6 (six) AM. 04/28/15  Yes Juline Patch, MD  metFORMIN (GLUCOPHAGE) 500 MG tablet TAKE (1) TABLET BY MOUTH TWICE DAILY 04/28/15  Yes Juline Patch, MD  Multiple Vitamins-Minerals (MULTIVITAMIN WITH MINERALS) tablet Take 1 tablet by mouth daily.   Yes Historical Provider, MD  omeprazole (PRILOSEC) 20 MG capsule Take 1 capsule (20 mg total) by mouth daily at 6 (six) AM. 04/28/15  Yes Juline Patch, MD  vitamin B-12 (CYANOCOBALAMIN) 1000 MCG tablet Take 1,000 mcg by mouth daily.   Yes Historical Provider, MD  amoxicillin (AMOXIL) 875 MG tablet Take 1 tablet (875 mg total) by mouth 2 (two) times daily. 06/24/15   Norval Gable, MD  guaiFENesin-codeine 100-10 MG/5ML syrup 59ml po q 8 hours prn cough 06/24/15   Onna Nodal  Rendell Thivierge, MD   Meds Ordered and Administered this Visit  Medications - No data to display  BP 157/73 mmHg  Pulse 76  Temp(Src) 99.1 F (37.3 C) (Oral)  Resp 18  Ht 5\' 8"  (1.727 m)  Wt 202 lb (91.627 kg)  BMI 30.72 kg/m2  SpO2 98% No data found.   Physical Exam  Constitutional: She appears well-developed and well-nourished. No distress.  HENT:  Head: Normocephalic and atraumatic.  Right Ear: Tympanic membrane, external ear and ear canal normal.  Left Ear: Tympanic membrane, external ear and ear canal normal.  Nose: Mucosal edema and  rhinorrhea present. No nose lacerations, sinus tenderness, nasal deformity, septal deviation or nasal septal hematoma. No epistaxis.  No foreign bodies. Right sinus exhibits maxillary sinus tenderness and frontal sinus tenderness. Left sinus exhibits maxillary sinus tenderness and frontal sinus tenderness.  Mouth/Throat: Uvula is midline, oropharynx is clear and moist and mucous membranes are normal. No oropharyngeal exudate.  Eyes: Conjunctivae and EOM are normal. Pupils are equal, round, and reactive to light. Right eye exhibits no discharge. Left eye exhibits no discharge. No scleral icterus.  Neck: Normal range of motion. Neck supple. No thyromegaly present.  Cardiovascular: Normal rate, regular rhythm and normal heart sounds.   Pulmonary/Chest: Effort normal and breath sounds normal. No respiratory distress. She has no wheezes. She has no rales.  Lymphadenopathy:    She has no cervical adenopathy.  Skin: No rash noted. She is not diaphoretic.  Nursing note and vitals reviewed.   ED Course  Procedures (including critical care time)  Labs Review Labs Reviewed - No data to display  Imaging Review No results found.   Visual Acuity Review  Right Eye Distance:   Left Eye Distance:   Bilateral Distance:    Right Eye Near:   Left Eye Near:    Bilateral Near:         MDM   1. Acute frontal sinusitis, recurrence not specified   2. Cough    Discharge Medication List as of 06/24/2015  4:15 PM    START taking these medications   Details  amoxicillin (AMOXIL) 875 MG tablet Take 1 tablet (875 mg total) by mouth 2 (two) times daily., Starting 06/24/2015, Until Discontinued, Normal    guaiFENesin-codeine 100-10 MG/5ML syrup 72ml po q 8 hours prn cough, Print       1. diagnosis reviewed with patient 2. rx as per orders above; reviewed possible side effects, interactions, risks and benefits  3. Recommend supportive treatment with rest, increased fluids, otc analgesics 4.  Follow-up prn if symptoms worsen or don't improve  Norval Gable, MD 06/24/15 1723

## 2015-09-09 ENCOUNTER — Ambulatory Visit
Admission: RE | Admit: 2015-09-09 | Discharge: 2015-09-09 | Disposition: A | Discharge status: Home / Self Care | Payer: Medicare Other | Source: Ambulatory Visit | Attending: Gastroenterology | Admitting: Gastroenterology

## 2015-09-09 ENCOUNTER — Encounter
Admission: RE | Disposition: A | Discharge status: Home / Self Care | Payer: Self-pay | Source: Ambulatory Visit | Attending: Gastroenterology

## 2015-09-09 ENCOUNTER — Ambulatory Visit: Payer: Medicare Other | Admitting: Registered Nurse

## 2015-09-09 ENCOUNTER — Encounter: Payer: Self-pay | Admitting: *Deleted

## 2015-09-09 DIAGNOSIS — Z79899 Other long term (current) drug therapy: Secondary | ICD-10-CM | POA: Diagnosis not present

## 2015-09-09 DIAGNOSIS — Z9889 Other specified postprocedural states: Secondary | ICD-10-CM | POA: Diagnosis not present

## 2015-09-09 DIAGNOSIS — Z538 Procedure and treatment not carried out for other reasons: Secondary | ICD-10-CM | POA: Diagnosis not present

## 2015-09-09 DIAGNOSIS — K219 Gastro-esophageal reflux disease without esophagitis: Secondary | ICD-10-CM | POA: Insufficient documentation

## 2015-09-09 DIAGNOSIS — Z90710 Acquired absence of both cervix and uterus: Secondary | ICD-10-CM | POA: Insufficient documentation

## 2015-09-09 DIAGNOSIS — E119 Type 2 diabetes mellitus without complications: Secondary | ICD-10-CM | POA: Diagnosis not present

## 2015-09-09 DIAGNOSIS — Z809 Family history of malignant neoplasm, unspecified: Secondary | ICD-10-CM | POA: Insufficient documentation

## 2015-09-09 DIAGNOSIS — E785 Hyperlipidemia, unspecified: Secondary | ICD-10-CM | POA: Diagnosis not present

## 2015-09-09 DIAGNOSIS — Z7984 Long term (current) use of oral hypoglycemic drugs: Secondary | ICD-10-CM | POA: Diagnosis not present

## 2015-09-09 DIAGNOSIS — Z8249 Family history of ischemic heart disease and other diseases of the circulatory system: Secondary | ICD-10-CM | POA: Diagnosis not present

## 2015-09-09 DIAGNOSIS — I10 Essential (primary) hypertension: Secondary | ICD-10-CM | POA: Insufficient documentation

## 2015-09-09 DIAGNOSIS — K5730 Diverticulosis of large intestine without perforation or abscess without bleeding: Secondary | ICD-10-CM | POA: Diagnosis not present

## 2015-09-09 DIAGNOSIS — Z1211 Encounter for screening for malignant neoplasm of colon: Secondary | ICD-10-CM | POA: Diagnosis not present

## 2015-09-09 DIAGNOSIS — K5790 Diverticulosis of intestine, part unspecified, without perforation or abscess without bleeding: Secondary | ICD-10-CM | POA: Diagnosis not present

## 2015-09-09 HISTORY — PX: COLONOSCOPY WITH PROPOFOL: SHX5780

## 2015-09-09 LAB — GLUCOSE, CAPILLARY: Glucose-Capillary: 107 mg/dL — ABNORMAL HIGH (ref 65–99)

## 2015-09-09 SURGERY — COLONOSCOPY WITH PROPOFOL
Anesthesia: General

## 2015-09-09 MED ORDER — SODIUM CHLORIDE 0.9 % IV SOLN: INTRAVENOUS | Status: DC

## 2015-09-09 MED ORDER — PROPOFOL 500 MG/50ML IV EMUL
INTRAVENOUS | Status: DC | PRN
  Administered 2015-09-09: 75 ug/kg/min via INTRAVENOUS

## 2015-09-09 MED ORDER — MIDAZOLAM HCL 2 MG/2ML IJ SOLN
INTRAMUSCULAR | Status: DC | PRN
  Administered 2015-09-09: 1 mg via INTRAVENOUS

## 2015-09-09 MED ORDER — SODIUM CHLORIDE 0.9 % IV SOLN
INTRAVENOUS | Status: DC
  Administered 2015-09-09: 10:00:00 via INTRAVENOUS

## 2015-09-09 MED ORDER — FENTANYL CITRATE (PF) 100 MCG/2ML IJ SOLN
INTRAMUSCULAR | Status: DC | PRN
  Administered 2015-09-09: 50 ug via INTRAVENOUS

## 2015-09-09 NOTE — Op Note (Signed)
[facility] Gastroenterology Patient Name: Frances Harmon Procedure Date: 09/09/2015 10:23 AM MRN: [id number] Account #: [id number] Date of Birth: 04/28/1946 Admit Type: Outpatient Age: 70 Room: [facility] ENDO ROOM 4 Gender: Female Note Status: Finalized Procedure:            Colonoscopy Indications:          Screening for colorectal malignant neoplasm Providers:            Lupita Dawn. Candace Cruise, MD Referring MD:         Juline Patch, MD (Referring MD) Medicines:            Monitored Anesthesia Care Complications:        No immediate complications. Procedure:            Pre-Anesthesia Assessment:                       - Prior to the procedure, a History and Physical was                        performed, and patient medications, allergies and                        sensitivities were reviewed. The patient's tolerance of                        previous anesthesia was reviewed.                       - The risks and benefits of the procedure and the                        sedation options and risks were discussed with the                        patient. All questions were answered and informed                        consent was obtained.                       - After reviewing the risks and benefits, the patient                        was deemed in satisfactory condition to undergo the                        procedure.                       After obtaining informed consent, the colonoscope was                        passed under direct vision. Throughout the procedure,                        the patient's blood pressure, pulse, and oxygen                        saturations were monitored continuously. The  Colonoscope was introduced through the anus with the                        intention of advancing to the cecum. The scope was                        advanced to the sigmoid colon before the procedure was                        aborted. Medications were  given. The colonoscopy was                        unusually difficult due to restricted mobility of the                        colon. The patient tolerated the procedure well. The                        quality of the bowel preparation was fair. Findings:      Multiple small and large-mouthed diverticula were found in the sigmoid       colon. Due to multiiple tics and perhaps adhesions, area around sigmoid       with very limited mobility. Unable to get PCF and even gastroscope       around siigmoid area. Elected to stop.      The exam was otherwise without abnormality. Impression:           - Preparation of the colon was fair.                       - Diverticulosis in the sigmoid colon.                       - The examination was otherwise normal.                       - No specimens collected. Recommendation:       - Discharge patient to home.                       - The findings and recommendations were discussed with                        the patient.                       - Schedule ACBE to evaluate rest of colon. Procedure Code(s):    --- Professional ---                       [phone], 65, Colonoscopy, flexible; diagnostic, including                        collection of specimen(s) by brushing or washing, when                        performed (separate procedure) Diagnosis Code(s):    --- Professional ---                       Z12.11, Encounter for screening for malignant neoplasm  of colon                       K57.30, Diverticulosis of large intestine without                        perforation or abscess without bleeding CPT copyright 2016 [employer]. All rights reserved. The codes documented in this report are preliminary and upon coder review may  be revised to meet current compliance requirements. Hulen Luster, MD 09/09/2015 10:46:51 AM This report has been signed electronically. Number of Addenda: 0 Note Initiated On: 09/09/2015 10:23  AM Total Procedure Duration: 0 hours 16 minutes 32 seconds       [facility]

## 2015-09-09 NOTE — Anesthesia Postprocedure Evaluation (Signed)
Anesthesia Post Note  Patient: CRYSTIANA BARTOLUCCI  Procedure(s) Performed: Procedure(s) (LRB): COLONOSCOPY WITH PROPOFOL (N/A)  Patient location during evaluation: PACU Anesthesia Type: General Level of consciousness: awake Pain management: pain level controlled Vital Signs Assessment: post-procedure vital signs reviewed and stable Respiratory status: spontaneous breathing Cardiovascular status: blood pressure returned to baseline Anesthetic complications: no    Last Vitals:  Filed Vitals:   09/09/15 1106 09/09/15 1120  BP: 124/69 138/68  Pulse: 58 58  Temp:    Resp: 11 13    Last Pain: There were no vitals filed for this visit.               VAN STAVEREN,Triston Skare

## 2015-09-09 NOTE — H&P (Signed)
Primary Care Physician:  Otilio Miu, MD Primary Gastroenterologist:  Dr. Candace Cruise  Pre-Procedure History & Physical: HPI:  Frances Harmon is a 70 y.o. female is here for an colonoscopy.   Past Medical History  Diagnosis Date  . GERD (gastroesophageal reflux disease)   . Diabetes mellitus without complication ([place])   . Hypertension   . Hyperlipidemia     Past Surgical History  Procedure Laterality Date  . Vaginal hysterectomy    . Foot surgery Bilateral     Bunion removal  . Incontinence surgery    . Colonoscopy  2009    repeat in 5 years- Dr Bary Leriche  . Ventral hernia repair N/A 05/06/2015    Procedure: HERNIA REPAIR VENTRAL ADULT;  Surgeon: Leonie Green, MD;  Location: [facility] ORS;  Service: General;  Laterality: N/A;    Prior to Admission medications   Medication Sig Start Date End Date Taking? Authorizing Provider  amoxicillin (AMOXIL) 875 MG tablet Take 1 tablet (875 mg total) by mouth 2 (two) times daily. 06/24/15   Norval Gable, MD  atorvastatin (LIPITOR) 10 MG tablet Take 1 tablet (10 mg total) by mouth daily at 6 (six) AM. 04/28/15   Juline Patch, MD  Cholecalciferol (VITAMIN D3) 1000 UNITS CAPS Take by mouth daily.    Historical Provider, MD  glipiZIDE (GLUCOTROL XL) 2.5 MG 24 hr tablet Take 1 tablet (2.5 mg total) by mouth daily at 6 (six) AM. 04/28/15   Juline Patch, MD  guaiFENesin-codeine 100-10 MG/5ML syrup 70ml po q 8 hours prn cough 06/24/15   Norval Gable, MD  HYDROcodone-acetaminophen (NORCO) 5-325 MG tablet Take 1-2 tablets by mouth every 4 (four) hours as needed for moderate pain. 05/06/15   Leonie Green, MD  losartan-hydrochlorothiazide [facility]) 100-25 MG tablet Take 1 tablet by mouth daily at 6 (six) AM. 04/28/15   Juline Patch, MD  metFORMIN (GLUCOPHAGE) 500 MG tablet TAKE (1) TABLET BY MOUTH TWICE DAILY 04/28/15   Juline Patch, MD  Multiple Vitamins-Minerals (MULTIVITAMIN WITH MINERALS) tablet Take 1 tablet by mouth daily.    Historical  Provider, MD  omeprazole (PRILOSEC) 20 MG capsule Take 1 capsule (20 mg total) by mouth daily at 6 (six) AM. 04/28/15   Juline Patch, MD  vitamin B-12 (CYANOCOBALAMIN) 1000 MCG tablet Take 1,000 mcg by mouth daily.    Historical Provider, MD    Allergies as of 09/06/2015  . (No Known Allergies)    Family History  Problem Relation Age of Onset  . Hypertension Brother   . Cancer Daughter     Social History   Social History  . Marital Status: Married    Spouse Name: N/A  . Number of Children: N/A  . Years of Education: N/A   Occupational History  . Not on file.   Social History Main Topics  . Smoking status: Never Smoker   . Smokeless tobacco: Never Used  . Alcohol Use: No  . Drug Use: No  . Sexual Activity: No   Other Topics Concern  . Not on file   Social History Narrative    Review of Systems: See HPI, otherwise negative ROS  Physical Exam: There were no vitals taken for this visit. General:   Alert,  pleasant and cooperative in NAD Head:  Normocephalic and atraumatic. Neck:  Supple; no masses or thyromegaly. Lungs:  Clear throughout to auscultation.    Heart:  Regular rate and rhythm. Abdomen:  Soft, nontender and nondistended. Normal bowel  sounds, without guarding, and without rebound.   Neurologic:  Alert and  oriented x4;  grossly normal neurologically.  Impression/Plan: Frances Harmon is here for an colonoscopy to be performed for screening.  Risks, benefits, limitations, and alternatives regarding  colonoscopy have been reviewed with the patient.  Questions have been answered.  All parties agreeable.   Gay Rape, Lupita Dawn, MD  09/09/2015, 9:42 AM

## 2015-09-09 NOTE — Anesthesia Preprocedure Evaluation (Signed)
Anesthesia Evaluation   Patient awake    Reviewed: Allergy & Precautions, NPO status , Patient's Chart, lab work & pertinent test results  Airway Mallampati: III       Dental  (+) Teeth Intact   Pulmonary neg pulmonary ROS,           Cardiovascular Exercise Tolerance: Good hypertension, Pt. on medications  Rhythm:Regular     Neuro/Psych negative neurological ROS     GI/Hepatic Neg liver ROS, GERD  ,  Endo/Other  diabetes, Well Controlled, Type 2, Oral Hypoglycemic Agents  Renal/GU negative Renal ROS     Musculoskeletal   Abdominal   Peds  Hematology   Anesthesia Other Findings   Reproductive/Obstetrics                             Anesthesia Physical Anesthesia Plan  ASA: II  Anesthesia Plan: General   Post-op Pain Management:    Induction: Intravenous  Airway Management Planned: Natural Airway and Nasal Cannula  Additional Equipment:   Intra-op Plan:   Post-operative Plan:   Informed Consent: I have reviewed the patients History and Physical, chart, labs and discussed the procedure including the risks, benefits and alternatives for the proposed anesthesia with the patient or authorized representative who has indicated his/her understanding and acceptance.     Plan Discussed with: CRNA  Anesthesia Plan Comments:         Anesthesia Quick Evaluation

## 2015-09-09 NOTE — Transfer of Care (Signed)
Immediate Anesthesia Transfer of Care Note  Patient: Frances Harmon  Procedure(s) Performed: Procedure(s): COLONOSCOPY WITH PROPOFOL (N/A)  Patient Location: PACU  Anesthesia Type:General  Level of Consciousness: awake  Airway & Oxygen Therapy: Patient Spontanous Breathing  Post-op Assessment: Report given to RN  Post vital signs: Reviewed and stable  Last Vitals:  Filed Vitals:   09/09/15 0950 09/09/15 1055  BP: 150/75 129/64  Pulse: 66 58  Temp: 37.2 C 36.4 C  Resp: 12 16    Complications: No apparent anesthesia complications

## 2015-09-13 ENCOUNTER — Encounter: Payer: Self-pay | Admitting: Gastroenterology

## 2015-09-22 DIAGNOSIS — E119 Type 2 diabetes mellitus without complications: Secondary | ICD-10-CM | POA: Diagnosis not present

## 2015-09-22 DIAGNOSIS — H1045 Other chronic allergic conjunctivitis: Secondary | ICD-10-CM | POA: Diagnosis not present

## 2015-11-11 ENCOUNTER — Other Ambulatory Visit: Payer: Self-pay | Admitting: Gastroenterology

## 2015-11-11 DIAGNOSIS — C189 Malignant neoplasm of colon, unspecified: Secondary | ICD-10-CM

## 2015-11-16 ENCOUNTER — Other Ambulatory Visit: Payer: Self-pay

## 2015-11-22 ENCOUNTER — Other Ambulatory Visit: Payer: Self-pay | Admitting: Gastroenterology

## 2015-11-22 ENCOUNTER — Ambulatory Visit
Admission: RE | Admit: 2015-11-22 | Discharge: 2015-11-22 | Disposition: A | Discharge status: Home / Self Care | Payer: Medicare Other | Source: Ambulatory Visit | Attending: Gastroenterology | Admitting: Gastroenterology

## 2015-11-22 DIAGNOSIS — Z539 Procedure and treatment not carried out, unspecified reason: Secondary | ICD-10-CM | POA: Diagnosis not present

## 2015-11-22 DIAGNOSIS — C189 Malignant neoplasm of colon, unspecified: Secondary | ICD-10-CM | POA: Insufficient documentation

## 2015-11-28 ENCOUNTER — Encounter: Payer: Self-pay | Admitting: Family Medicine

## 2015-11-28 ENCOUNTER — Ambulatory Visit (INDEPENDENT_AMBULATORY_CARE_PROVIDER_SITE_OTHER): Payer: Medicare Other | Admitting: Family Medicine

## 2015-11-28 VITALS — BP 130/88 | HR 80 | Ht 68.0 in | Wt 194.0 lb

## 2015-11-28 DIAGNOSIS — K219 Gastro-esophageal reflux disease without esophagitis: Secondary | ICD-10-CM

## 2015-11-28 DIAGNOSIS — E119 Type 2 diabetes mellitus without complications: Secondary | ICD-10-CM

## 2015-11-28 DIAGNOSIS — E785 Hyperlipidemia, unspecified: Secondary | ICD-10-CM

## 2015-11-28 DIAGNOSIS — I10 Essential (primary) hypertension: Secondary | ICD-10-CM | POA: Diagnosis not present

## 2015-11-28 DIAGNOSIS — E559 Vitamin D deficiency, unspecified: Secondary | ICD-10-CM

## 2015-11-28 DIAGNOSIS — Z1239 Encounter for other screening for malignant neoplasm of breast: Secondary | ICD-10-CM

## 2015-11-28 MED ORDER — METFORMIN HCL 500 MG PO TABS: ORAL_TABLET | ORAL | Status: DC

## 2015-11-28 MED ORDER — LOSARTAN POTASSIUM-HCTZ 100-25 MG PO TABS: 1.0000 | ORAL_TABLET | Freq: Every day | ORAL | Status: DC

## 2015-11-28 MED ORDER — OMEPRAZOLE 20 MG PO CPDR: 20.0000 mg | DELAYED_RELEASE_CAPSULE | Freq: Every day | ORAL | Status: DC

## 2015-11-28 MED ORDER — ATORVASTATIN CALCIUM 10 MG PO TABS: 10.0000 mg | ORAL_TABLET | Freq: Every day | ORAL | Status: DC

## 2015-11-28 MED ORDER — GLIPIZIDE ER 2.5 MG PO TB24: 2.5000 mg | ORAL_TABLET | Freq: Every day | ORAL | Status: DC

## 2015-11-28 NOTE — Progress Notes (Signed)
Name: Frances Harmon   MRN: [id number]    DOB: 09-19-1945   Date:11/28/2015       Progress Note  Subjective  Chief Complaint  Chief Complaint  Patient presents with  . Hypertension  . Hyperlipidemia  . Gastroesophageal Reflux  . Diabetes    Hypertension This is a chronic problem. The current episode started more than 1 year ago. The problem has been gradually improving since onset. Pertinent negatives include no anxiety, blurred vision, chest pain, headaches, malaise/fatigue, neck pain, orthopnea, palpitations, peripheral edema, PND, shortness of breath or sweats. There are no associated agents to hypertension. Risk factors for coronary artery disease include diabetes mellitus and dyslipidemia. Past treatments include angiotensin blockers and diuretics. The current treatment provides moderate improvement. There are no compliance problems.  There is no history of angina, kidney disease, CAD/MI, CVA, heart failure, left ventricular hypertrophy, PVD, renovascular disease or retinopathy. There is no history of chronic renal disease or a hypertension causing med.  Hyperlipidemia This is a chronic problem. The current episode started more than 1 year ago. The problem is controlled. Recent lipid tests were reviewed and are normal. Exacerbating diseases include diabetes. She has no history of chronic renal disease, hypothyroidism, liver disease or nephrotic syndrome. There are no known factors aggravating her hyperlipidemia. Pertinent negatives include no chest pain, focal sensory loss, focal weakness, leg pain, myalgias or shortness of breath. Current antihyperlipidemic treatment includes statins. The current treatment provides mild improvement of lipids. There are no compliance problems.   Gastroesophageal Reflux She reports no abdominal pain, no chest pain, no coughing, no dysphagia, no heartburn, no nausea, no sore throat or no wheezing. This is a chronic problem. The current episode started more  than 1 year ago. The problem occurs frequently. The problem has been gradually improving. The symptoms are aggravated by certain foods. Pertinent negatives include no fatigue, melena or weight loss.  Diabetes She presents for her follow-up diabetic visit. She has type 2 diabetes mellitus. Her disease course has been stable. There are no hypoglycemic associated symptoms. Pertinent negatives for hypoglycemia include no dizziness, headaches, nervousness/anxiousness or sweats. Pertinent negatives for diabetes include no blurred vision, no chest pain, no fatigue, no foot paresthesias, no foot ulcerations, no polydipsia, no polyphagia, no polyuria, no visual change, no weakness and no weight loss. There are no hypoglycemic complications. Symptoms are stable. There are no diabetic complications. Pertinent negatives for diabetic complications include no CVA, PVD or retinopathy. Current diabetic treatment includes oral agent (dual therapy). She is following a generally healthy diet. She participates in exercise daily. Her breakfast blood glucose is taken between 8-9 am. Her breakfast blood glucose range is generally 90-110 mg/dl. She does not see a podiatrist.Eye exam is current.    No problem-specific assessment & plan notes found for this encounter.   Past Medical History  Diagnosis Date  . GERD (gastroesophageal reflux disease)   . Diabetes mellitus without complication ([place])   . Hypertension   . Hyperlipidemia     Past Surgical History  Procedure Laterality Date  . Vaginal hysterectomy    . Foot surgery Bilateral     Bunion removal  . Incontinence surgery    . Colonoscopy  2009    repeat in 5 years- Dr Bary Leriche  . Ventral hernia repair N/A 05/06/2015    Procedure: HERNIA REPAIR VENTRAL ADULT;  Surgeon: Leonie Green, MD;  Location: [facility] ORS;  Service: General;  Laterality: N/A;  . Colonoscopy with propofol N/A 09/09/2015  Procedure: COLONOSCOPY WITH PROPOFOL;  Surgeon: Hulen Luster, MD;   Location: [facility] ENDOSCOPY;  Service: Gastroenterology;  Laterality: N/A;    Family History  Problem Relation Age of Onset  . Hypertension Brother   . Cancer Daughter     Social History   Social History  . Marital Status: Married    Spouse Name: N/A  . Number of Children: N/A  . Years of Education: N/A   Occupational History  . Not on file.   Social History Main Topics  . Smoking status: Never Smoker   . Smokeless tobacco: Never Used  . Alcohol Use: No  . Drug Use: No  . Sexual Activity: No   Other Topics Concern  . Not on file   Social History Narrative    No Known Allergies   Review of Systems  Constitutional: Negative for fever, chills, weight loss, malaise/fatigue and fatigue.  HENT: Negative for ear discharge, ear pain and sore throat.   Eyes: Negative for blurred vision.  Respiratory: Negative for cough, sputum production, shortness of breath and wheezing.   Cardiovascular: Negative for chest pain, palpitations, orthopnea, leg swelling and PND.  Gastrointestinal: Negative for heartburn, dysphagia, nausea, abdominal pain, diarrhea, constipation, blood in stool and melena.  Genitourinary: Negative for dysuria, urgency, frequency and hematuria.  Musculoskeletal: Negative for myalgias, back pain, joint pain and neck pain.  Skin: Negative for rash.  Neurological: Negative for dizziness, tingling, sensory change, focal weakness, weakness and headaches.  Endo/Heme/Allergies: Negative for environmental allergies, polydipsia and polyphagia. Does not bruise/bleed easily.  Psychiatric/Behavioral: Negative for depression and suicidal ideas. The patient is not nervous/anxious and does not have insomnia.      Objective  Filed Vitals:   11/28/15 1424  BP: 130/88  Pulse: 80  Height: 5\' 8"  (1.727 m)  Weight: 194 lb (87.998 kg)    Physical Exam  Constitutional: She is well-developed, well-nourished, and in no distress. No distress.  HENT:  Head: Normocephalic and  atraumatic.  Right Ear: External ear normal.  Left Ear: External ear normal.  Nose: Nose normal.  Mouth/Throat: Oropharynx is clear and moist.  Eyes: Conjunctivae and EOM are normal. Pupils are equal, round, and reactive to light. Right eye exhibits no discharge. Left eye exhibits no discharge.  Neck: Normal range of motion. Neck supple. No JVD present. No thyromegaly present.  Cardiovascular: Normal rate, regular rhythm, normal heart sounds and intact distal pulses.  Exam reveals no gallop and no friction rub.   No murmur heard. Pulmonary/Chest: Effort normal and breath sounds normal. She has no wheezes. She has no rales. She exhibits no tenderness. Right breast exhibits no inverted nipple, no mass, no nipple discharge, no skin change and no tenderness. Left breast exhibits no inverted nipple, no mass, no nipple discharge, no skin change and no tenderness. Breasts are symmetrical.  Abdominal: Soft. Bowel sounds are normal. She exhibits no mass. There is no tenderness. There is no guarding.  Musculoskeletal: Normal range of motion. She exhibits no edema.  Lymphadenopathy:    She has no cervical adenopathy.  Neurological: She is alert.  Skin: Skin is warm and dry. She is not diaphoretic.  Psychiatric: Mood and affect normal.  Nursing note and vitals reviewed.     Assessment & Plan  Problem List Items Addressed This Visit      Cardiovascular and Mediastinum   Essential hypertension - Primary   Relevant Medications   losartan-hydrochlorothiazide (HYZAAR) 100-25 MG tablet   atorvastatin (LIPITOR) 10 MG tablet   Other  Relevant Orders   Renal Function Panel     Digestive   Esophageal reflux   Relevant Medications   omeprazole (PRILOSEC) 20 MG capsule     Other   Hyperlipidemia   Relevant Medications   losartan-hydrochlorothiazide (HYZAAR) 100-25 MG tablet   atorvastatin (LIPITOR) 10 MG tablet   Other Relevant Orders   Lipid Profile   Vitamin D deficiency    Other Visit  Diagnoses    Type 2 diabetes mellitus without complication, without long-term current use of insulin (HCC)        Relevant Medications    losartan-hydrochlorothiazide (HYZAAR) 100-25 MG tablet    atorvastatin (LIPITOR) 10 MG tablet    glipiZIDE (GLUCOTROL XL) 2.5 MG 24 hr tablet    metFORMIN (GLUCOPHAGE) 500 MG tablet    Other Relevant Orders    Hemoglobin A1c    Microalbumin / creatinine urine ratio    Breast cancer screening        Relevant Orders    MM Digital Screening         Dr. [facility]  11/28/2015

## 2015-11-29 LAB — RENAL FUNCTION PANEL
Albumin: 4 g/dL (ref 3.6–4.8)
BUN/Creatinine Ratio: 16 (ref 12–28)
BUN: 11 mg/dL (ref 8–27)
CO2: 24 mmol/L (ref 18–29)
CREATININE: 0.67 mg/dL (ref 0.57–1.00)
Calcium: 9.7 mg/dL (ref 8.7–10.3)
Chloride: 102 mmol/L (ref 96–106)
GFR calc Af Amer: 104 mL/min/{1.73_m2} (ref >59)
GFR, EST NON AFRICAN AMERICAN: 91 mL/min/{1.73_m2} (ref >59)
Glucose: 86 mg/dL (ref 65–99)
Phosphorus: 3.3 mg/dL (ref 2.5–4.5)
Potassium: 4.4 mmol/L (ref 3.5–5.2)
SODIUM: 144 mmol/L (ref 134–144)

## 2015-11-29 LAB — LIPID PANEL
CHOL/HDL RATIO: 2.7 ratio (ref 0.0–4.4)
Cholesterol, Total: 145 mg/dL (ref 100–199)
HDL: 53 mg/dL (ref >39)
LDL CALC: 70 mg/dL (ref 0–99)
TRIGLYCERIDES: 109 mg/dL (ref 0–149)
VLDL Cholesterol Cal: 22 mg/dL (ref 5–40)

## 2015-11-29 LAB — HEMOGLOBIN A1C
Est. average glucose Bld gHb Est-mCnc: 126 mg/dL
Hgb A1c MFr Bld: 6 % — ABNORMAL HIGH (ref 4.8–5.6)

## 2015-11-29 LAB — MICROALBUMIN / CREATININE URINE RATIO
Creatinine, Urine: 129.3 mg/dL
MICROALB/CREAT RATIO: 3.2 mg/g creat (ref 0.0–30.0)
Microalbumin, Urine: 4.1 ug/mL

## 2015-12-08 ENCOUNTER — Ambulatory Visit: Payer: Medicare Other

## 2015-12-12 ENCOUNTER — Ambulatory Visit
Admission: RE | Admit: 2015-12-12 | Discharge: 2015-12-12 | Disposition: A | Discharge status: Home / Self Care | Payer: Medicare Other | Source: Ambulatory Visit | Attending: Family Medicine | Admitting: Family Medicine

## 2015-12-12 DIAGNOSIS — Z1239 Encounter for other screening for malignant neoplasm of breast: Secondary | ICD-10-CM

## 2015-12-12 DIAGNOSIS — Z1231 Encounter for screening mammogram for malignant neoplasm of breast: Secondary | ICD-10-CM | POA: Diagnosis not present

## 2016-01-26 ENCOUNTER — Other Ambulatory Visit: Payer: Self-pay

## 2016-04-12 ENCOUNTER — Ambulatory Visit (INDEPENDENT_AMBULATORY_CARE_PROVIDER_SITE_OTHER): Payer: Medicare Other | Admitting: Family Medicine

## 2016-04-12 ENCOUNTER — Encounter: Payer: Self-pay | Admitting: Family Medicine

## 2016-04-12 VITALS — BP 130/88 | HR 64 | Ht 68.0 in | Wt 190.0 lb

## 2016-04-12 DIAGNOSIS — N3001 Acute cystitis with hematuria: Secondary | ICD-10-CM | POA: Diagnosis not present

## 2016-04-12 LAB — POCT URINALYSIS DIPSTICK
Glucose, UA: NEGATIVE
Nitrite, UA: NEGATIVE
PH UA: 6.5
Spec Grav, UA: 1.02
UROBILINOGEN UA: 4

## 2016-04-12 MED ORDER — SULFAMETHOXAZOLE-TRIMETHOPRIM 800-160 MG PO TABS
1.0000 | ORAL_TABLET | Freq: Two times a day (BID) | ORAL | 0 refills | Status: DC
Start: 2016-04-12 — End: 2016-06-20

## 2016-04-12 NOTE — Progress Notes (Signed)
Name: Frances Harmon   MRN: [id number]    DOB: 01-03-1946   Date:04/12/2016       Progress Note  Subjective  Chief Complaint  Chief Complaint  Patient presents with  . Urinary Tract Infection    see's a brown bloody discharge when wiping/ feels sticky    Urinary Tract Infection   This is a new problem. The current episode started 1 to 4 weeks ago. The problem occurs intermittently. The problem has been waxing and waning. The quality of the pain is described as aching. The pain is at a severity of 3/10. There has been no fever. Associated symptoms include frequency, hematuria and urgency. Pertinent negatives include no chills, discharge, flank pain, hesitancy, nausea, sweats or vomiting. She has tried nothing for the symptoms. The treatment provided mild relief.    No problem-specific Assessment & Plan notes found for this encounter.   Past Medical History:  Diagnosis Date  . Diabetes mellitus without complication ([place])   . GERD (gastroesophageal reflux disease)   . Hyperlipidemia   . Hypertension     Past Surgical History:  Procedure Laterality Date  . COLONOSCOPY  2009   repeat in 5 years- Dr Bary Leriche  . COLONOSCOPY WITH PROPOFOL N/A 09/09/2015   Procedure: COLONOSCOPY WITH PROPOFOL;  Surgeon: Hulen Luster, MD;  Location: [facility] ENDOSCOPY;  Service: Gastroenterology;  Laterality: N/A;  . FOOT SURGERY Bilateral    Bunion removal  . INCONTINENCE SURGERY    . VAGINAL HYSTERECTOMY    . VENTRAL HERNIA REPAIR N/A 05/06/2015   Procedure: HERNIA REPAIR VENTRAL ADULT;  Surgeon: Leonie Green, MD;  Location: [facility] ORS;  Service: General;  Laterality: N/A;    Family History  Problem Relation Age of Onset  . Cancer Daughter   . Breast cancer Daughter 68  . Breast cancer Mother 75  . Hypertension Brother     Social History   Social History  . Marital status: Married    Spouse name: N/A  . Number of children: N/A  . Years of education: N/A   Occupational History  . Not  on file.   Social History Main Topics  . Smoking status: Never Smoker  . Smokeless tobacco: Never Used  . Alcohol use No  . Drug use: No  . Sexual activity: No   Other Topics Concern  . Not on file   Social History Narrative  . No narrative on file    No Known Allergies   Review of Systems  Constitutional: Negative for chills, fever, malaise/fatigue and weight loss.  HENT: Negative for ear discharge, ear pain and sore throat.   Eyes: Negative for blurred vision.  Respiratory: Negative for cough, sputum production, shortness of breath and wheezing.   Cardiovascular: Negative for chest pain, palpitations and leg swelling.  Gastrointestinal: Negative for abdominal pain, blood in stool, constipation, diarrhea, heartburn, melena, nausea and vomiting.  Genitourinary: Positive for frequency, hematuria and urgency. Negative for dysuria, flank pain and hesitancy.  Musculoskeletal: Negative for back pain, joint pain, myalgias and neck pain.  Skin: Negative for rash.  Neurological: Negative for dizziness, tingling, sensory change, focal weakness and headaches.  Endo/Heme/Allergies: Negative for environmental allergies and polydipsia. Does not bruise/bleed easily.  Psychiatric/Behavioral: Negative for depression and suicidal ideas. The patient is not nervous/anxious and does not have insomnia.      Objective  Vitals:   04/12/16 1559  BP: 130/88  Pulse: 64  Weight: 190 lb (86.2 kg)  Height: 5\' 8"  (1.727 m)  Physical Exam  Constitutional: She is well-developed, well-nourished, and in no distress. No distress.  HENT:  Head: Normocephalic and atraumatic.  Right Ear: External ear normal.  Left Ear: External ear normal.  Nose: Nose normal.  Mouth/Throat: Oropharynx is clear and moist.  Eyes: Conjunctivae and EOM are normal. Pupils are equal, round, and reactive to light. Right eye exhibits no discharge. Left eye exhibits no discharge.  Neck: Normal range of motion. Neck supple.  No JVD present. No thyromegaly present.  Cardiovascular: Normal rate, regular rhythm, normal heart sounds and intact distal pulses.  Exam reveals no gallop and no friction rub.   No murmur heard. Pulmonary/Chest: Effort normal and breath sounds normal. She has no wheezes. She has no rales.  Abdominal: Soft. Bowel sounds are normal. She exhibits no mass. There is no hepatosplenomegaly. There is no tenderness. There is no guarding and no CVA tenderness.  Musculoskeletal: Normal range of motion. She exhibits no edema.  Lymphadenopathy:    She has no cervical adenopathy.  Neurological: She is alert.  Skin: Skin is warm and dry. She is not diaphoretic.  Psychiatric: Mood and affect normal.  Nursing note and vitals reviewed.     Assessment & Plan  Problem List Items Addressed This Visit    None    Visit Diagnoses    Acute cystitis with hematuria    -  Primary   Relevant Medications   sulfamethoxazole-trimethoprim (BACTRIM DS,SEPTRA DS) 800-160 MG tablet   Other Relevant Orders   POCT Urinalysis Dipstick (Completed)        Dr. Mac[facility]  04/12/16

## 2016-06-08 ENCOUNTER — Other Ambulatory Visit: Payer: Self-pay

## 2016-06-08 DIAGNOSIS — K219 Gastro-esophageal reflux disease without esophagitis: Secondary | ICD-10-CM

## 2016-06-08 DIAGNOSIS — E785 Hyperlipidemia, unspecified: Secondary | ICD-10-CM

## 2016-06-08 DIAGNOSIS — E119 Type 2 diabetes mellitus without complications: Secondary | ICD-10-CM

## 2016-06-08 DIAGNOSIS — I10 Essential (primary) hypertension: Secondary | ICD-10-CM

## 2016-06-08 MED ORDER — OMEPRAZOLE 20 MG PO CPDR: 20.0000 mg | DELAYED_RELEASE_CAPSULE | Freq: Every day | ORAL | 0 refills | Status: DC

## 2016-06-08 MED ORDER — ATORVASTATIN CALCIUM 10 MG PO TABS
10.0000 mg | ORAL_TABLET | Freq: Every day | ORAL | 0 refills | Status: DC
Start: 2016-06-08 — End: 2016-07-03

## 2016-06-08 MED ORDER — GLIPIZIDE ER 2.5 MG PO TB24: 2.5000 mg | ORAL_TABLET | Freq: Every day | ORAL | 0 refills | Status: DC

## 2016-06-08 MED ORDER — LOSARTAN POTASSIUM-HCTZ 100-25 MG PO TABS: 1.0000 | ORAL_TABLET | Freq: Every day | ORAL | 0 refills | Status: DC

## 2016-06-08 MED ORDER — METFORMIN HCL 500 MG PO TABS: ORAL_TABLET | ORAL | 0 refills | Status: DC

## 2016-06-20 ENCOUNTER — Other Ambulatory Visit: Payer: Self-pay

## 2016-06-20 ENCOUNTER — Telehealth: Payer: Self-pay

## 2016-06-20 DIAGNOSIS — N3001 Acute cystitis with hematuria: Secondary | ICD-10-CM

## 2016-06-20 MED ORDER — SULFAMETHOXAZOLE-TRIMETHOPRIM 800-160 MG PO TABS: 1.0000 | ORAL_TABLET | Freq: Two times a day (BID) | ORAL | 0 refills | Status: DC

## 2016-06-20 NOTE — Telephone Encounter (Signed)
Pt called requesting a refill on Septra- sent in to [facility]/ notified that she would need to be seen next time or if this didn't clear up

## 2016-07-03 ENCOUNTER — Ambulatory Visit (INDEPENDENT_AMBULATORY_CARE_PROVIDER_SITE_OTHER): Payer: Medicare Other | Admitting: Family Medicine

## 2016-07-03 ENCOUNTER — Encounter: Payer: Self-pay | Admitting: Family Medicine

## 2016-07-03 VITALS — BP 120/64 | HR 64 | Ht 68.0 in | Wt 186.0 lb

## 2016-07-03 DIAGNOSIS — E119 Type 2 diabetes mellitus without complications: Secondary | ICD-10-CM

## 2016-07-03 DIAGNOSIS — R079 Chest pain, unspecified: Secondary | ICD-10-CM

## 2016-07-03 DIAGNOSIS — K219 Gastro-esophageal reflux disease without esophagitis: Secondary | ICD-10-CM

## 2016-07-03 DIAGNOSIS — E785 Hyperlipidemia, unspecified: Secondary | ICD-10-CM | POA: Diagnosis not present

## 2016-07-03 DIAGNOSIS — I10 Essential (primary) hypertension: Secondary | ICD-10-CM

## 2016-07-03 MED ORDER — OMEPRAZOLE 20 MG PO CPDR: 20.0000 mg | DELAYED_RELEASE_CAPSULE | Freq: Every day | ORAL | 5 refills | Status: DC

## 2016-07-03 MED ORDER — ATORVASTATIN CALCIUM 10 MG PO TABS
10.0000 mg | ORAL_TABLET | Freq: Every day | ORAL | 5 refills | Status: DC
Start: 2016-07-03 — End: 2017-03-19

## 2016-07-03 MED ORDER — GLIPIZIDE ER 2.5 MG PO TB24: 2.5000 mg | ORAL_TABLET | Freq: Every day | ORAL | 5 refills | Status: DC

## 2016-07-03 MED ORDER — LOSARTAN POTASSIUM-HCTZ 100-25 MG PO TABS: 1.0000 | ORAL_TABLET | Freq: Every day | ORAL | 5 refills | Status: DC

## 2016-07-03 MED ORDER — METFORMIN HCL 500 MG PO TABS: ORAL_TABLET | ORAL | 5 refills | Status: DC

## 2016-07-03 NOTE — Progress Notes (Signed)
Name: Frances Harmon   MRN: [id number]    DOB: 08/31/1945   Date:07/03/2016       Progress Note  Subjective  Chief Complaint  Chief Complaint  Patient presents with  . Diabetes  . Gastroesophageal Reflux  . Hypertension  . Hyperlipidemia  . Chest Pain    R) sided "squeezing pain that goes around to shoulder blades"- had 2 episodes in the past 2 weeks    Diabetes  She presents for her follow-up diabetic visit. She has type 2 diabetes mellitus. Her disease course has been stable. Pertinent negatives for hypoglycemia include no dizziness, headaches or nervousness/anxiousness. Associated symptoms include chest pain. Pertinent negatives for diabetes include no blurred vision, no fatigue, no foot paresthesias, no foot ulcerations, no polydipsia, no polyphagia, no polyuria, no visual change, no weakness and no weight loss. There are no hypoglycemic complications. Symptoms are stable. There are no diabetic complications. Pertinent negatives for diabetic complications include no CVA, PVD or retinopathy. Risk factors for coronary artery disease include diabetes mellitus, hypertension and dyslipidemia. Current diabetic treatment includes oral agent (dual therapy). She is compliant with treatment all of the time. She is following a generally healthy diet. She has not had a previous visit with a dietitian. Her home blood glucose trend is fluctuating minimally. Her breakfast blood glucose range is generally 110-130 mg/dl. An ACE inhibitor/angiotensin II receptor blocker is being taken. She does not see a podiatrist.Eye exam is current.  Hypertension  This is a chronic problem. The current episode started more than 1 year ago. The problem is unchanged. The problem is controlled. Associated symptoms include chest pain. Pertinent negatives include no blurred vision, headaches, malaise/fatigue, neck pain, palpitations or shortness of breath. There are no associated agents to hypertension. Risk factors for  coronary artery disease include diabetes mellitus and dyslipidemia. Past treatments include ACE inhibitors and diuretics. The current treatment provides mild improvement. There are no compliance problems.  There is no history of angina, kidney disease, CAD/MI, CVA, heart failure, left ventricular hypertrophy, PVD, renovascular disease or retinopathy. There is no history of chronic renal disease or a hypertension causing med.  Hyperlipidemia  This is a chronic problem. The current episode started more than 1 month ago. The problem is controlled. Recent lipid tests were reviewed and are normal. She has no history of chronic renal disease. Associated symptoms include chest pain. Pertinent negatives include no focal weakness, leg pain, myalgias or shortness of breath. She is currently on no antihyperlipidemic treatment. The current treatment provides mild improvement of lipids. There are no compliance problems.  There are no known risk factors for coronary artery disease.  Gastroesophageal Reflux  She complains of chest pain. She reports no abdominal pain, no belching, no choking, no coughing, no dysphagia, no early satiety, no globus sensation, no heartburn, no hoarse voice, no nausea, no sore throat, no stridor, no tooth decay, no water brash or no wheezing. This is a new problem. The current episode started more than 1 year ago. The symptoms are aggravated by certain foods. Pertinent negatives include no fatigue, melena or weight loss. She has tried a PPI for the symptoms.  Chest Pain   This is a new problem. The current episode started in the past 7 days. The onset quality is sudden. The problem occurs every several days. The problem has been waxing and waning. The pain is present in the substernal region. The pain is moderate. The quality of the pain is described as pressure and tightness. The pain  radiates to the right shoulder. Pertinent negatives include no abdominal pain, back pain, cough, diaphoresis,  dizziness, exertional chest pressure, fever, headaches, hemoptysis, irregular heartbeat, leg pain, malaise/fatigue, nausea, numbness, palpitations, shortness of breath, sputum production or weakness.  Her past medical history is significant for hyperlipidemia and hypertension.  Pertinent negatives for past medical history include no PVD.    No problem-specific Assessment & Plan notes found for this encounter.   Past Medical History:  Diagnosis Date  . Diabetes mellitus without complication ([place])   . GERD (gastroesophageal reflux disease)   . Hyperlipidemia   . Hypertension     Past Surgical History:  Procedure Laterality Date  . COLONOSCOPY  2009   repeat in 5 years- Dr Bary Leriche  . COLONOSCOPY WITH PROPOFOL N/A 09/09/2015   Procedure: COLONOSCOPY WITH PROPOFOL;  Surgeon: Hulen Luster, MD;  [facility] ENDOSCOPY;  Service: Gastroenterology;  Laterality: N/A;  . FOOT SURGERY Bilateral    Bunion removal  . INCONTINENCE SURGERY    . VAGINAL HYSTERECTOMY    . VENTRAL HERNIA REPAIR N/A 05/06/2015   Procedure: HERNIA REPAIR VENTRAL ADULT;  Surgeon: Leonie Green, MD;  Location: [facility] ORS;  Service: General;  Laterality: N/A;    Family History  Problem Relation Age of Onset  . Cancer Daughter   . Breast cancer Daughter 37  . Breast cancer Mother 11  . Hypertension Brother     Social History   Social History  . Marital status: Married    Spouse name: N/A  . Number of children: N/A  . Years of education: N/A   Occupational History  . Not on file.   Social History Main Topics  . Smoking status: Never Smoker  . Smokeless tobacco: Never Used  . Alcohol use No  . Drug use: No  . Sexual activity: No   Other Topics Concern  . Not on file   Social History Narrative  . No narrative on file    No Known Allergies   Review of Systems  Constitutional: Negative for chills, diaphoresis, fatigue, fever, malaise/fatigue and weight loss.  HENT: Negative for ear discharge,  ear pain, hoarse voice and sore throat.   Eyes: Negative for blurred vision.  Respiratory: Negative for cough, hemoptysis, sputum production, choking, shortness of breath and wheezing.   Cardiovascular: Positive for chest pain. Negative for palpitations and leg swelling.  Gastrointestinal: Negative for abdominal pain, blood in stool, constipation, diarrhea, dysphagia, heartburn, melena and nausea.  Genitourinary: Negative for dysuria, frequency, hematuria and urgency.  Musculoskeletal: Negative for back pain, joint pain, myalgias and neck pain.  Skin: Negative for rash.  Neurological: Negative for dizziness, tingling, sensory change, focal weakness, weakness, numbness and headaches.  Endo/Heme/Allergies: Negative for environmental allergies, polydipsia and polyphagia. Does not bruise/bleed easily.  Psychiatric/Behavioral: Negative for depression and suicidal ideas. The patient is not nervous/anxious and does not have insomnia.      Objective  Vitals:   07/03/16 1416  BP: 120/64  Pulse: 64  Weight: 186 lb (84.4 kg)  Height: 5\' 8"  (1.727 m)    Physical Exam  Constitutional: She is well-developed, well-nourished, and in no distress. No distress.  HENT:  Head: Normocephalic and atraumatic.  Right Ear: External ear normal.  Left Ear: External ear normal.  Nose: Nose normal.  Mouth/Throat: Oropharynx is clear and moist.  Eyes: Conjunctivae and EOM are normal. Pupils are equal, round, and reactive to light. Right eye exhibits no discharge. Left eye exhibits no discharge.  Neck: Normal range of  motion. Neck supple. No JVD present. No thyromegaly present.  Cardiovascular: Normal rate, regular rhythm, normal heart sounds and intact distal pulses.  Exam reveals no gallop and no friction rub.   No murmur heard. Pulmonary/Chest: Effort normal and breath sounds normal. She has no wheezes. She has no rales.  Abdominal: Soft. Bowel sounds are normal. She exhibits no mass. There is no tenderness.  There is no guarding.  Musculoskeletal: Normal range of motion. She exhibits no edema.  Lymphadenopathy:    She has no cervical adenopathy.  Neurological: She is alert. She has normal reflexes.  Skin: Skin is warm and dry. She is not diaphoretic.  Psychiatric: Mood and affect normal.  Nursing note and vitals reviewed.     Assessment & Plan  Problem List Items Addressed This Visit      Cardiovascular and Mediastinum   Essential hypertension   Relevant Medications   losartan-hydrochlorothiazide (HYZAAR) 100-25 MG tablet   atorvastatin (LIPITOR) 10 MG tablet   Other Relevant Orders   Renal Function Panel   Hepatic function panel     Digestive   Esophageal reflux   Relevant Medications   omeprazole (PRILOSEC) 20 MG capsule     Other   Hyperlipidemia   Relevant Medications   losartan-hydrochlorothiazide (HYZAAR) 100-25 MG tablet   atorvastatin (LIPITOR) 10 MG tablet   Other Relevant Orders   Lipid Profile   Hepatic function panel    Other Visit Diagnoses    Chest pain at rest    -  Primary   antiacid prn/ to er if unresolved   Relevant Orders   EKG 12-Lead (Completed)   Ambulatory referral to Cardiology   Type 2 diabetes mellitus without complication, without long-term current use of insulin (HCC)       Relevant Medications   metFORMIN (GLUCOPHAGE) 500 MG tablet   losartan-hydrochlorothiazide (HYZAAR) 100-25 MG tablet   glipiZIDE (GLUCOTROL XL) 2.5 MG 24 hr tablet   atorvastatin (LIPITOR) 10 MG tablet   Other Relevant Orders   HgB A1c   Urine Microalbumin w/creat. ratio        Dr. [facility]  07/03/16

## 2016-07-04 DIAGNOSIS — I10 Essential (primary) hypertension: Secondary | ICD-10-CM | POA: Diagnosis not present

## 2016-07-04 DIAGNOSIS — I208 Other forms of angina pectoris: Secondary | ICD-10-CM | POA: Diagnosis not present

## 2016-07-04 DIAGNOSIS — E782 Mixed hyperlipidemia: Secondary | ICD-10-CM | POA: Diagnosis not present

## 2016-07-04 LAB — LIPID PANEL
CHOL/HDL RATIO: 2.7 ratio (ref 0.0–4.4)
CHOLESTEROL TOTAL: 150 mg/dL (ref 100–199)
HDL: 55 mg/dL (ref >39)
LDL CALC: 73 mg/dL (ref 0–99)
TRIGLYCERIDES: 109 mg/dL (ref 0–149)
VLDL CHOLESTEROL CAL: 22 mg/dL (ref 5–40)

## 2016-07-04 LAB — MICROALBUMIN / CREATININE URINE RATIO
CREATININE, UR: 171.8 mg/dL
MICROALB/CREAT RATIO: 3.6 mg/g{creat} (ref 0.0–30.0)
MICROALBUM., U, RANDOM: 6.1 ug/mL

## 2016-07-04 LAB — RENAL FUNCTION PANEL
ALBUMIN: 4.4 g/dL (ref 3.6–4.8)
BUN / CREAT RATIO: 14 (ref 12–28)
BUN: 14 mg/dL (ref 8–27)
CHLORIDE: 103 mmol/L (ref 96–106)
CO2: 26 mmol/L (ref 18–29)
Calcium: 10.1 mg/dL (ref 8.7–10.3)
Creatinine, Ser: 1 mg/dL (ref 0.57–1.00)
GFR, EST AFRICAN AMERICAN: 67 mL/min/{1.73_m2} (ref >59)
GFR, EST NON AFRICAN AMERICAN: 58 mL/min/{1.73_m2} — AB (ref >59)
GLUCOSE: 88 mg/dL (ref 65–99)
POTASSIUM: 5 mmol/L (ref 3.5–5.2)
Phosphorus: 3.9 mg/dL (ref 2.5–4.5)
Sodium: 144 mmol/L (ref 134–144)

## 2016-07-04 LAB — HEPATIC FUNCTION PANEL
ALT: 12 IU/L (ref 0–32)
AST: 15 IU/L (ref 0–40)
Alkaline Phosphatase: 74 IU/L (ref 39–117)
Bilirubin Total: 0.5 mg/dL (ref 0.0–1.2)
Bilirubin, Direct: 0.16 mg/dL (ref 0.00–0.40)
Total Protein: 7.6 g/dL (ref 6.0–8.5)

## 2016-07-04 LAB — HEMOGLOBIN A1C
Est. average glucose Bld gHb Est-mCnc: 111 mg/dL
Hgb A1c MFr Bld: 5.5 % (ref 4.8–5.6)

## 2016-07-10 DIAGNOSIS — I208 Other forms of angina pectoris: Secondary | ICD-10-CM | POA: Diagnosis not present

## 2016-07-10 DIAGNOSIS — I10 Essential (primary) hypertension: Secondary | ICD-10-CM | POA: Diagnosis not present

## 2016-07-10 DIAGNOSIS — E782 Mixed hyperlipidemia: Secondary | ICD-10-CM | POA: Diagnosis not present

## 2016-09-21 ENCOUNTER — Encounter: Payer: Self-pay | Admitting: *Deleted

## 2016-09-21 ENCOUNTER — Ambulatory Visit
Admission: EM | Admit: 2016-09-21 | Discharge: 2016-09-21 | Discharge status: Against Medical Advice | Payer: Medicare Other

## 2016-09-21 DIAGNOSIS — H018 Other specified inflammations of eyelid: Secondary | ICD-10-CM | POA: Diagnosis not present

## 2016-09-21 DIAGNOSIS — E119 Type 2 diabetes mellitus without complications: Secondary | ICD-10-CM | POA: Diagnosis not present

## 2016-09-21 DIAGNOSIS — H1045 Other chronic allergic conjunctivitis: Secondary | ICD-10-CM | POA: Diagnosis not present

## 2016-09-21 NOTE — ED Triage Notes (Signed)
Patient injured her left arm just above the elbow 1 week ago while at work. This is not a workers comp case. Visible bruising is present.

## 2016-11-01 ENCOUNTER — Other Ambulatory Visit: Payer: Self-pay | Admitting: Family Medicine

## 2016-11-01 DIAGNOSIS — Z1231 Encounter for screening mammogram for malignant neoplasm of breast: Secondary | ICD-10-CM

## 2016-11-13 DIAGNOSIS — E782 Mixed hyperlipidemia: Secondary | ICD-10-CM | POA: Diagnosis not present

## 2016-11-13 DIAGNOSIS — I10 Essential (primary) hypertension: Secondary | ICD-10-CM | POA: Diagnosis not present

## 2016-11-13 DIAGNOSIS — I208 Other forms of angina pectoris: Secondary | ICD-10-CM | POA: Diagnosis not present

## 2016-12-12 ENCOUNTER — Ambulatory Visit
Admission: RE | Admit: 2016-12-12 | Discharge: 2016-12-12 | Disposition: A | Discharge status: Home / Self Care | Payer: Medicare Other | Source: Ambulatory Visit | Attending: Family Medicine | Admitting: Family Medicine

## 2016-12-12 DIAGNOSIS — Z1231 Encounter for screening mammogram for malignant neoplasm of breast: Secondary | ICD-10-CM

## 2017-01-14 ENCOUNTER — Other Ambulatory Visit: Payer: Self-pay

## 2017-01-14 DIAGNOSIS — E119 Type 2 diabetes mellitus without complications: Secondary | ICD-10-CM

## 2017-01-14 MED ORDER — METFORMIN HCL 500 MG PO TABS: ORAL_TABLET | ORAL | 1 refills | Status: DC

## 2017-01-14 MED ORDER — GLIPIZIDE ER 2.5 MG PO TB24: 2.5000 mg | ORAL_TABLET | Freq: Every day | ORAL | 1 refills | Status: DC

## 2017-01-21 ENCOUNTER — Other Ambulatory Visit: Payer: Self-pay

## 2017-01-21 ENCOUNTER — Ambulatory Visit (INDEPENDENT_AMBULATORY_CARE_PROVIDER_SITE_OTHER): Payer: Medicare Other

## 2017-01-21 VITALS — BP 156/80 | HR 62 | Temp 98.1°F | Resp 16 | Ht 63.0 in | Wt 193.8 lb

## 2017-01-21 DIAGNOSIS — Z0000 Encounter for general adult medical examination without abnormal findings: Secondary | ICD-10-CM | POA: Diagnosis not present

## 2017-01-21 DIAGNOSIS — E119 Type 2 diabetes mellitus without complications: Secondary | ICD-10-CM | POA: Diagnosis not present

## 2017-01-21 DIAGNOSIS — Z1159 Encounter for screening for other viral diseases: Secondary | ICD-10-CM | POA: Diagnosis not present

## 2017-01-21 DIAGNOSIS — D229 Melanocytic nevi, unspecified: Secondary | ICD-10-CM

## 2017-01-21 MED ORDER — FLUCONAZOLE 150 MG PO TABS: 150.0000 mg | ORAL_TABLET | Freq: Once | ORAL | 0 refills | Status: AC

## 2017-01-21 NOTE — Patient Instructions (Signed)
Frances Harmon , Thank you for taking time to come for your Medicare Wellness Visit. I appreciate your ongoing commitment to your health goals. Please review the following plan we discussed and let me know if I can assist you in the future.   Screening recommendations/referrals: Colonoscopy: Completed 11/22/2015 Mammogram: Completed 12/12/2016 Bone Density: Completed 12/07/2016 Recommended yearly ophthalmology/optometry visit for glaucoma screening and checkup Recommended yearly dental visit for hygiene and checkup  Vaccinations: Influenza vaccine: up to date, due 04/2017 Pneumococcal vaccine: up to date Tdap vaccine: up to date Shingles vaccine: due, check with your insurance company for coverage  Advanced directives: Please bring a copy of your health care power of attorney and living will to the office at your convenience.  Conditions/risks identified: Recommend drinking at least 5-6 glasses of water a day   Next appointment: Follow up in one year for your annual wellness exam.   Preventive Care 69 Years and Older, Female Preventive care refers to lifestyle choices and visits with your health care provider that can promote health and wellness. What does preventive care include?  A yearly physical exam. This is also called an annual well check.  Dental exams once or twice a year.  Routine eye exams. Ask your health care provider how often you should have your eyes checked.  Personal lifestyle choices, including:  Daily care of your teeth and gums.  Regular physical activity.  Eating a healthy diet.  Avoiding tobacco and drug use.  Limiting alcohol use.  Practicing safe sex.  Taking low-dose aspirin every day.  Taking vitamin and mineral supplements as recommended by your health care provider. What happens during an annual well check? The services and screenings done by your health care provider during your annual well check will depend on your age, overall health,  lifestyle risk factors, and family history of disease. Counseling  Your health care provider may ask you questions about your:  Alcohol use.  Tobacco use.  Drug use.  Emotional well-being.  Home and relationship well-being.  Sexual activity.  Eating habits.  History of falls.  Memory and ability to understand (cognition).  Work and work Statistician.  Reproductive health. Screening  You may have the following tests or measurements:  Height, weight, and BMI.  Blood pressure.  Lipid and cholesterol levels. These may be checked every 5 years, or more frequently if you are over 36 years old.  Skin check.  Lung cancer screening. You may have this screening every year starting at age 69 if you have a 30-pack-year history of smoking and currently smoke or have quit within the past 15 years.  Fecal occult blood test (FOBT) of the stool. You may have this test every year starting at age 69.  Flexible sigmoidoscopy or colonoscopy. You may have a sigmoidoscopy every 5 years or a colonoscopy every 10 years starting at age 69.  Hepatitis C blood test.  Hepatitis B blood test.  Sexually transmitted disease (STD) testing.  Diabetes screening. This is done by checking your blood sugar (glucose) after you have not eaten for a while (fasting). You may have this done every 1-3 years.  Bone density scan. This is done to screen for osteoporosis. You may have this done starting at age 69.  Mammogram. This may be done every 1-2 years. Talk to your health care provider about how often you should have regular mammograms. Talk with your health care provider about your test results, treatment options, and if necessary, the need for more tests. Vaccines  Your health care provider may recommend certain vaccines, such as:  Influenza vaccine. This is recommended every year.  Tetanus, diphtheria, and acellular pertussis (Tdap, Td) vaccine. You may need a Td booster every 10 years.  Zoster  vaccine. You may need this after age 66.  Pneumococcal 13-valent conjugate (PCV13) vaccine. One dose is recommended after age 21.  Pneumococcal polysaccharide (PPSV23) vaccine. One dose is recommended after age 70. Talk to your health care provider about which screenings and vaccines you need and how often you need them. This information is not intended to replace advice given to you by your health care provider. Make sure you discuss any questions you have with your health care provider. Document Released: 08/05/2015 Document Revised: 03/28/2016 Document Reviewed: 05/10/2015 Elsevier Interactive Patient Education  2017 [place] Prevention in the Home Falls can cause injuries. They can happen to people of all ages. There are many things you can do to make your home safe and to help prevent falls. What can I do on the outside of my home?  Regularly fix the edges of walkways and driveways and fix any cracks.  Remove anything that might make you trip as you walk through a door, such as a raised step or threshold.  Trim any bushes or trees on the path to your home.  Use bright outdoor lighting.  Clear any walking paths of anything that might make someone trip, such as rocks or tools.  Regularly check to see if handrails are loose or broken. Make sure that both sides of any steps have handrails.  Any raised decks and porches should have guardrails on the edges.  Have any leaves, snow, or ice cleared regularly.  Use sand or salt on walking paths during winter.  Clean up any spills in your garage right away. This includes oil or grease spills. What can I do in the bathroom?  Use night lights.  Install grab bars by the toilet and in the tub and shower. Do not use towel bars as grab bars.  Use non-skid mats or decals in the tub or shower.  If you need to sit down in the shower, use a plastic, non-slip stool.  Keep the floor dry. Clean up any water that spills on the  floor as soon as it happens.  Remove soap buildup in the tub or shower regularly.  Attach bath mats securely with double-sided non-slip rug tape.  Do not have throw rugs and other things on the floor that can make you trip. What can I do in the bedroom?  Use night lights.  Make sure that you have a light by your bed that is easy to reach.  Do not use any sheets or blankets that are too big for your bed. They should not hang down onto the floor.  Have a firm chair that has side arms. You can use this for support while you get dressed.  Do not have throw rugs and other things on the floor that can make you trip. What can I do in the kitchen?  Clean up any spills right away.  Avoid walking on wet floors.  Keep items that you use a lot in easy-to-reach places.  If you need to reach something above you, use a strong step stool that has a grab bar.  Keep electrical cords out of the way.  Do not use floor polish or wax that makes floors slippery. If you must use wax, use non-skid floor wax.  Do  not have throw rugs and other things on the floor that can make you trip. What can I do with my stairs?  Do not leave any items on the stairs.  Make sure that there are handrails on both sides of the stairs and use them. Fix handrails that are broken or loose. Make sure that handrails are as long as the stairways.  Check any carpeting to make sure that it is firmly attached to the stairs. Fix any carpet that is loose or worn.  Avoid having throw rugs at the top or bottom of the stairs. If you do have throw rugs, attach them to the floor with carpet tape.  Make sure that you have a light switch at the top of the stairs and the bottom of the stairs. If you do not have them, ask someone to add them for you. What else can I do to help prevent falls?  Wear shoes that:  Do not have high heels.  Have rubber bottoms.  Are comfortable and fit you well.  Are closed at the toe. Do not wear  sandals.  If you use a stepladder:  Make sure that it is fully opened. Do not climb a closed stepladder.  Make sure that both sides of the stepladder are locked into place.  Ask someone to hold it for you, if possible.  Clearly mark and make sure that you can see:  Any grab bars or handrails.  First and last steps.  Where the edge of each step is.  Use tools that help you move around (mobility aids) if they are needed. These include:  Canes.  Walkers.  Scooters.  Crutches.  Turn on the lights when you go into a dark area. Replace any light bulbs as soon as they burn out.  Set up your furniture so you have a clear path. Avoid moving your furniture around.  If any of your floors are uneven, fix them.  If there are any pets around you, be aware of where they are.  Review your medicines with your doctor. Some medicines can make you feel dizzy. This can increase your chance of falling. Ask your doctor what other things that you can do to help prevent falls. This information is not intended to replace advice given to you by your health care provider. Make sure you discuss any questions you have with your health care provider. Document Released: 05/05/2009 Document Revised: 12/15/2015 Document Reviewed: 08/13/2014 Elsevier Interactive Patient Education  2017 [employer].

## 2017-01-21 NOTE — Progress Notes (Signed)
Subjective:   Frances Harmon is a 71 y.o. female who presents for an Initial Medicare Annual Wellness Visit.  Review of Systems      Cardiac Risk Factors include: diabetes mellitus;advanced age (>25men, >84 women);dyslipidemia;hypertension;obesity (BMI >30kg/m2)     Objective:    Today's Vitals   01/21/17 0945  BP: (!) 156/80  Pulse: 62  Resp: 16  Temp: 98.1 F (36.7 C)  Weight: 193 lb 12.8 oz (87.9 kg)  Height: 5\' 3"  (1.6 m)   Body mass index is 34.33 kg/m.   Current Medications (verified) Outpatient Encounter Prescriptions as of 01/21/2017  Medication Sig  . atorvastatin (LIPITOR) 10 MG tablet Take 1 tablet (10 mg total) by mouth daily at 6 (six) AM.  . glipiZIDE (GLUCOTROL XL) 2.5 MG 24 hr tablet Take 1 tablet (2.5 mg total) by mouth daily at 6 (six) AM.  . losartan-hydrochlorothiazide (HYZAAR) 100-25 MG tablet Take 1 tablet by mouth daily at 6 (six) AM.  . metFORMIN (GLUCOPHAGE) 500 MG tablet TAKE (1) TABLET BY MOUTH TWICE DAILY  . omeprazole (PRILOSEC) 20 MG capsule Take 1 capsule (20 mg total) by mouth daily at 6 (six) AM.   No facility-administered encounter medications on file as of 01/21/2017.     Allergies (verified) Patient has no known allergies.   History: Past Medical History:  Diagnosis Date  . Diabetes mellitus without complication ([place])   . GERD (gastroesophageal reflux disease)   . Hyperlipidemia   . Hypertension    Past Surgical History:  Procedure Laterality Date  . COLONOSCOPY  2009   repeat in 5 years- Dr Bary Leriche  . COLONOSCOPY WITH PROPOFOL N/A 09/09/2015   Procedure: COLONOSCOPY WITH PROPOFOL;  Surgeon: Hulen Luster, MD;  Location: [facility] ENDOSCOPY;  Service: Gastroenterology;  Laterality: N/A;  . FOOT SURGERY Bilateral    Bunion removal  . INCONTINENCE SURGERY    . VAGINAL HYSTERECTOMY    . VENTRAL HERNIA REPAIR N/A 05/06/2015   Procedure: HERNIA REPAIR VENTRAL ADULT;  Surgeon: Leonie Green, MD;  Location: [facility] ORS;  Service:  General;  Laterality: N/A;   Family History  Problem Relation Age of Onset  . Breast cancer Daughter 81  . Breast cancer Mother 63  . Hypertension Brother   . Diabetes Brother   . Arthritis Brother    Social History   Occupational History  . Not on file.   Social History Main Topics  . Smoking status: Never Smoker  . Smokeless tobacco: Never Used  . Alcohol use No  . Drug use: No  . Sexual activity: No    Tobacco Counseling Counseling given: Not Answered   Activities of Daily Living In your present state of health, do you have any difficulty performing the following activities: 01/21/2017  Hearing? N  Vision? N  Difficulty concentrating or making decisions? N  Walking or climbing stairs? N  Dressing or bathing? N  Doing errands, shopping? N  Preparing Food and eating ? N  Using the Toilet? N  In the past six months, have you accidently leaked urine? N  Do you have problems with loss of bowel control? N  Managing your Medications? N  Managing your Finances? N  Housekeeping or managing your Housekeeping? N  Some recent data might be hidden    Immunizations and Health Maintenance Immunization History  Administered Date(s) Administered  . Influenza,inj,Quad PF,36+ Mos 04/28/2015   There are no preventive care reminders to display for this patient.  Patient Care Team: Otilio Miu  C, MD as PCP - General (Family Medicine)  Indicate any recent Medical Services you may have received from other than [facility] providers in the past year (date may be approximate).     Assessment:   This is a routine wellness examination for Frances Harmon.   Hearing/Vision screen Vision Screening Comments: Sees Dr.woodard annually  Dietary issues and exercise activities discussed: Current Exercise Habits: The patient does not participate in regular exercise at present, Exercise limited by: None identified  Goals    . Increase water intake          Recommend drinking at least 5-6 glasses  of water a day       Depression Screen PHQ 2/9 Scores 01/21/2017 07/03/2016 04/28/2015 03/07/2015  PHQ - 2 Score 0 0 0 0    Fall Risk Fall Risk  01/21/2017 07/03/2016 04/28/2015 03/07/2015  Falls in the past year? Yes No No No  Number falls in past yr: 1 - - -  Injury with Fall? Yes - - -  Follow up Falls prevention discussed - - -    Cognitive Function:     6CIT Screen 01/21/2017  What Year? 0 points  What month? 0 points  What time? 0 points  Count back from 20 0 points  Months in reverse 0 points  Repeat phrase 0 points  Total Score 0    Screening Tests Health Maintenance  Topic Date Due  . INFLUENZA VACCINE  02/20/2017  . FOOT EXAM  07/03/2017  . HEMOGLOBIN A1C  07/24/2017  . OPHTHALMOLOGY EXAM  09/21/2017  . MAMMOGRAM  12/13/2018  . COLONOSCOPY  09/08/2025  . TETANUS/TDAP  12/05/2026  . DEXA SCAN  Completed  . Hepatitis C Screening  Completed  . PNA vac Low Risk Adult  Completed      Plan:  I have personally reviewed and addressed the Medicare Annual Wellness questionnaire and have noted the following in the patient's chart:  A. Medical and social history B. Use of alcohol, tobacco or illicit drugs  C. Current medications and supplements D. Functional ability and status E.  Nutritional status F.  Physical activity G. Advance directives H. List of other physicians I.  Hospitalizations, surgeries, and ER visits in previous 12 months J.  [place] such as hearing and vision if needed, cognitive and depression L. Referrals and appointments   In addition, I have reviewed and discussed with patient certain preventive protocols, quality metrics, and best practice recommendations. A written personalized care plan for preventive services as well as general preventive health recommendations were provided to patient.   Signed,  Tyler Aas, LPN Nurse Health Advisor   MD Recommendations: Patient complained of yellow vaginal discharge occurring  occasionally, states she has mentioned it before but it went away and started back last week. States she has not tried any over the counter medications.She denies any burning or itching. She also requests to see a dermatologist for some areas of concern on her legs.

## 2017-01-22 LAB — HEPATITIS C ANTIBODY: Hep C Virus Ab: <0.1 s/co ratio (ref 0.0–0.9)

## 2017-01-22 LAB — HEMOGLOBIN A1C
Est. average glucose Bld gHb Est-mCnc: 111 mg/dL
Hgb A1c MFr Bld: 5.5 % (ref 4.8–5.6)

## 2017-01-30 DIAGNOSIS — L718 Other rosacea: Secondary | ICD-10-CM | POA: Diagnosis not present

## 2017-01-30 DIAGNOSIS — L570 Actinic keratosis: Secondary | ICD-10-CM | POA: Diagnosis not present

## 2017-01-30 DIAGNOSIS — D485 Neoplasm of uncertain behavior of skin: Secondary | ICD-10-CM | POA: Diagnosis not present

## 2017-01-30 DIAGNOSIS — L821 Other seborrheic keratosis: Secondary | ICD-10-CM | POA: Diagnosis not present

## 2017-03-04 ENCOUNTER — Other Ambulatory Visit: Payer: Self-pay | Admitting: Family Medicine

## 2017-03-04 DIAGNOSIS — I10 Essential (primary) hypertension: Secondary | ICD-10-CM

## 2017-03-19 ENCOUNTER — Encounter: Payer: Self-pay | Admitting: Family Medicine

## 2017-03-19 ENCOUNTER — Ambulatory Visit (INDEPENDENT_AMBULATORY_CARE_PROVIDER_SITE_OTHER): Payer: Medicare Other | Admitting: Family Medicine

## 2017-03-19 DIAGNOSIS — E118 Type 2 diabetes mellitus with unspecified complications: Secondary | ICD-10-CM

## 2017-03-19 DIAGNOSIS — Z23 Encounter for immunization: Secondary | ICD-10-CM

## 2017-03-19 DIAGNOSIS — I10 Essential (primary) hypertension: Secondary | ICD-10-CM

## 2017-03-19 DIAGNOSIS — E785 Hyperlipidemia, unspecified: Secondary | ICD-10-CM

## 2017-03-19 DIAGNOSIS — E119 Type 2 diabetes mellitus without complications: Secondary | ICD-10-CM | POA: Diagnosis not present

## 2017-03-19 DIAGNOSIS — K219 Gastro-esophageal reflux disease without esophagitis: Secondary | ICD-10-CM | POA: Diagnosis not present

## 2017-03-19 MED ORDER — ATORVASTATIN CALCIUM 10 MG PO TABS: 10.0000 mg | ORAL_TABLET | Freq: Every day | ORAL | 1 refills | Status: DC

## 2017-03-19 MED ORDER — LOSARTAN POTASSIUM-HCTZ 100-25 MG PO TABS: ORAL_TABLET | ORAL | 1 refills | Status: DC

## 2017-03-19 MED ORDER — OMEPRAZOLE 20 MG PO CPDR: 20.0000 mg | DELAYED_RELEASE_CAPSULE | Freq: Every day | ORAL | 1 refills | Status: DC

## 2017-03-19 MED ORDER — GLIPIZIDE ER 2.5 MG PO TB24: 2.5000 mg | ORAL_TABLET | Freq: Every day | ORAL | 1 refills | Status: DC

## 2017-03-19 MED ORDER — METFORMIN HCL 500 MG PO TABS: ORAL_TABLET | ORAL | 1 refills | Status: DC

## 2017-03-19 NOTE — Progress Notes (Signed)
Name: Frances Harmon   MRN: [id number]    DOB: [birth year]   Date:03/19/2017       Progress Note  Subjective  Chief Complaint  Chief Complaint  Patient presents with  . Follow-up    Diabetes  She presents for her follow-up diabetic visit. She has type 2 diabetes mellitus. Her disease course has been stable. There are no hypoglycemic associated symptoms. Pertinent negatives for hypoglycemia include no dizziness, headaches, nervousness/anxiousness or sweats. There are no diabetic associated symptoms. Pertinent negatives for diabetes include no blurred vision, no chest pain, no polydipsia and no weight loss. Symptoms are stable. Pertinent negatives for diabetic complications include no CVA, PVD or retinopathy. Risk factors for coronary artery disease include diabetes mellitus, dyslipidemia, hypertension, obesity and post-menopausal. Current diabetic treatment includes oral agent (dual therapy). She is compliant with treatment all of the time. Her weight is stable. She is following a generally healthy diet. She participates in exercise intermittently. Her breakfast blood glucose is taken between 8-9 am. Her breakfast blood glucose range is generally 90-110 mg/dl. An ACE inhibitor/angiotensin II receptor blocker is being taken. She does not see a podiatrist.Eye exam is current.  Hypertension  This is a chronic problem. The current episode started more than 1 year ago. The problem is unchanged. The problem is controlled. Pertinent negatives include no anxiety, blurred vision, chest pain, headaches, malaise/fatigue, neck pain, orthopnea, palpitations, peripheral edema, PND, shortness of breath or sweats. There are no associated agents to hypertension. Past treatments include angiotensin blockers and diuretics. The current treatment provides moderate improvement. There are no compliance problems.  There is no history of angina, kidney disease, CAD/MI, CVA, heart failure, left ventricular hypertrophy, PVD or  retinopathy. There is no history of chronic renal disease, a hypertension causing med or renovascular disease.  Hyperlipidemia  This is a chronic problem. The problem is controlled. Recent lipid tests were reviewed and are normal. She has no history of chronic renal disease, diabetes, hypothyroidism, liver disease or nephrotic syndrome. Factors aggravating her hyperlipidemia include thiazides. Pertinent negatives include no chest pain, focal weakness, myalgias or shortness of breath. Current antihyperlipidemic treatment includes statins. The current treatment provides moderate improvement of lipids. There are no compliance problems.   Gastroesophageal Reflux  She reports no abdominal pain, no belching, no chest pain, no choking, no coughing, no early satiety, no globus sensation, no heartburn, no hoarse voice, no nausea, no sore throat, no stridor, no tooth decay, no water brash or no wheezing. This is a new problem. The current episode started more than 1 year ago. The symptoms are aggravated by certain foods. Pertinent negatives include no melena or weight loss. She has tried a PPI for the symptoms. The treatment provided moderate relief.    No problem-specific Assessment & Plan notes found for this encounter.   Past Medical History:  Diagnosis Date  . Diabetes mellitus without complication ([place])   . GERD (gastroesophageal reflux disease)   . Hyperlipidemia   . Hypertension     Past Surgical History:  Procedure Laterality Date  . COLONOSCOPY  2009   repeat in 5 years- Dr Bary Leriche  . COLONOSCOPY WITH PROPOFOL N/A 09/09/2015   Procedure: COLONOSCOPY WITH PROPOFOL;  Surgeon: Hulen Luster, MD;  Location: [facility] ENDOSCOPY;  Service: Gastroenterology;  Laterality: N/A;  . FOOT SURGERY Bilateral    Bunion removal  . INCONTINENCE SURGERY    . VAGINAL HYSTERECTOMY    . VENTRAL HERNIA REPAIR N/A 05/06/2015   Procedure: HERNIA REPAIR VENTRAL ADULT;  Surgeon: Leonie Green, MD;  Location: [facility]  ORS;  Service: General;  Laterality: N/A;    Family History  Problem Relation Age of Onset  . Breast cancer Daughter 4  . Breast cancer Mother 4  . Hypertension Brother   . Diabetes Brother   . Arthritis Brother     Social History   Social History  . Marital status: Married    Spouse name: N/A  . Number of children: N/A  . Years of education: N/A   Occupational History  . Not on file.   Social History Main Topics  . Smoking status: Never Smoker  . Smokeless tobacco: Never Used  . Alcohol use No  . Drug use: No  . Sexual activity: No   Other Topics Concern  . Not on file   Social History Narrative  . No narrative on file    No Known Allergies  Outpatient Medications Prior to Visit  Medication Sig Dispense Refill  . atorvastatin (LIPITOR) 10 MG tablet Take 1 tablet (10 mg total) by mouth daily at 6 (six) AM. 30 tablet 5  . glipiZIDE (GLUCOTROL XL) 2.5 MG 24 hr tablet Take 1 tablet (2.5 mg total) by mouth daily at 6 (six) AM. 30 tablet 1  . losartan-hydrochlorothiazide (HYZAAR) 100-25 MG tablet TAKE (1) TABLET BY MOUTH EVERY DAY AT 6 IN THE MORNING 30 tablet 1  . metFORMIN (GLUCOPHAGE) 500 MG tablet TAKE (1) TABLET BY MOUTH TWICE DAILY 60 tablet 1  . omeprazole (PRILOSEC) 20 MG capsule Take 1 capsule (20 mg total) by mouth daily at 6 (six) AM. 30 capsule 5   No facility-administered medications prior to visit.     Review of Systems  Constitutional: Negative for chills, fever, malaise/fatigue and weight loss.  HENT: Negative for ear discharge, ear pain, hoarse voice and sore throat.   Eyes: Negative for blurred vision.  Respiratory: Negative for cough, sputum production, choking, shortness of breath and wheezing.   Cardiovascular: Negative for chest pain, palpitations, orthopnea, leg swelling and PND.  Gastrointestinal: Negative for abdominal pain, blood in stool, constipation, diarrhea, heartburn, melena and nausea.  Genitourinary: Negative for dysuria,  frequency, hematuria and urgency.  Musculoskeletal: Negative for back pain, joint pain, myalgias and neck pain.  Skin: Negative for rash.  Neurological: Negative for dizziness, tingling, sensory change, focal weakness and headaches.  Endo/Heme/Allergies: Negative for environmental allergies and polydipsia. Does not bruise/bleed easily.  Psychiatric/Behavioral: Negative for depression and suicidal ideas. The patient is not nervous/anxious and does not have insomnia.      Objective  Vitals:   03/19/17 1350  BP: 108/62  Pulse: 67  Resp: 16  Temp: 98.3 F (36.8 C)  TempSrc: Oral  SpO2: 98%  Weight: 192 lb 3.2 oz (87.2 kg)    Physical Exam  Constitutional: She is well-developed, well-nourished, and in no distress. No distress.  HENT:  Head: Normocephalic and atraumatic.  Right Ear: External ear normal.  Left Ear: External ear normal.  Nose: Nose normal.  Mouth/Throat: Oropharynx is clear and moist.  Eyes: Pupils are equal, round, and reactive to light. Conjunctivae and EOM are normal. Right eye exhibits no discharge. Left eye exhibits no discharge.  Neck: Normal range of motion. Neck supple. No JVD present. No thyromegaly present.  Cardiovascular: Normal rate, regular rhythm, normal heart sounds and intact distal pulses.  Exam reveals no gallop and no friction rub.   No murmur heard. Pulmonary/Chest: Effort normal and breath sounds normal. She has no wheezes. She has no  rales.  Abdominal: Soft. Bowel sounds are normal. She exhibits no mass. There is no tenderness. There is no guarding.  Musculoskeletal: Normal range of motion. She exhibits no edema.  Lymphadenopathy:    She has no cervical adenopathy.  Neurological: She is alert. She has normal reflexes.  Skin: Skin is warm and dry. She is not diaphoretic.  Psychiatric: Mood and affect normal.  Nursing note and vitals reviewed.     Assessment & Plan  Problem List Items Addressed This Visit      Cardiovascular and  Mediastinum   Essential hypertension   Relevant Medications   atorvastatin (LIPITOR) 10 MG tablet   losartan-hydrochlorothiazide (HYZAAR) 100-25 MG tablet     Digestive   Esophageal reflux   Relevant Medications   omeprazole (PRILOSEC) 20 MG capsule     Endocrine   Type 2 diabetes mellitus with complication, without long-term current use of insulin (HCC)   Relevant Medications   glipiZIDE (GLUCOTROL XL) 2.5 MG 24 hr tablet   metFORMIN (GLUCOPHAGE) 500 MG tablet   atorvastatin (LIPITOR) 10 MG tablet   losartan-hydrochlorothiazide (HYZAAR) 100-25 MG tablet     Other   Hyperlipidemia   Relevant Medications   atorvastatin (LIPITOR) 10 MG tablet   losartan-hydrochlorothiazide (HYZAAR) 100-25 MG tablet    Other Visit Diagnoses    Type 2 diabetes mellitus without complication, without long-term current use of insulin (HCC)       Relevant Medications   glipiZIDE (GLUCOTROL XL) 2.5 MG 24 hr tablet   metFORMIN (GLUCOPHAGE) 500 MG tablet   atorvastatin (LIPITOR) 10 MG tablet   losartan-hydrochlorothiazide (HYZAAR) 100-25 MG tablet   Influenza vaccine needed       Relevant Orders   Flu vaccine HIGH DOSE PF (Fluzone High dose) (Completed)      Meds ordered this encounter  Medications  . glipiZIDE (GLUCOTROL XL) 2.5 MG 24 hr tablet    Sig: Take 1 tablet (2.5 mg total) by mouth daily at 6 (six) AM.    Dispense:  90 tablet    Refill:  1  . metFORMIN (GLUCOPHAGE) 500 MG tablet    Sig: TAKE (1) TABLET BY MOUTH TWICE DAILY    Dispense:  180 tablet    Refill:  1  . atorvastatin (LIPITOR) 10 MG tablet    Sig: Take 1 tablet (10 mg total) by mouth daily at 6 (six) AM.    Dispense:  90 tablet    Refill:  1  . omeprazole (PRILOSEC) 20 MG capsule    Sig: Take 1 capsule (20 mg total) by mouth daily at 6 (six) AM.    Dispense:  90 capsule    Refill:  1  . losartan-hydrochlorothiazide (HYZAAR) 100-25 MG tablet    Sig: TAKE (1) TABLET BY MOUTH EVERY DAY AT 6 IN THE MORNING    Dispense:   90 tablet    Refill:  1      Dr. Hollee Fate [facility]  03/19/17

## 2017-04-03 DIAGNOSIS — L718 Other rosacea: Secondary | ICD-10-CM | POA: Diagnosis not present

## 2017-04-22 ENCOUNTER — Other Ambulatory Visit: Payer: Medicare Other

## 2017-04-22 ENCOUNTER — Telehealth: Payer: Self-pay

## 2017-04-22 DIAGNOSIS — E119 Type 2 diabetes mellitus without complications: Secondary | ICD-10-CM

## 2017-04-22 LAB — MICROALBUMIN, URINE: MICROALB UR: NORMAL

## 2017-04-22 NOTE — Telephone Encounter (Signed)
Will come by tomorrow to drop off urine for Microalbumin. [facility]

## 2017-04-23 LAB — MICROALBUMIN / CREATININE URINE RATIO: CREATININE, UR: 33.8 mg/dL

## 2017-04-24 ENCOUNTER — Other Ambulatory Visit: Payer: Self-pay

## 2017-06-24 ENCOUNTER — Other Ambulatory Visit: Payer: Self-pay | Admitting: Family Medicine

## 2017-06-24 DIAGNOSIS — E119 Type 2 diabetes mellitus without complications: Secondary | ICD-10-CM

## 2017-07-13 ENCOUNTER — Other Ambulatory Visit: Payer: Self-pay | Admitting: Family Medicine

## 2017-07-13 DIAGNOSIS — K219 Gastro-esophageal reflux disease without esophagitis: Secondary | ICD-10-CM

## 2017-07-25 DIAGNOSIS — I10 Essential (primary) hypertension: Secondary | ICD-10-CM | POA: Diagnosis not present

## 2017-07-25 DIAGNOSIS — E782 Mixed hyperlipidemia: Secondary | ICD-10-CM | POA: Diagnosis not present

## 2017-08-15 ENCOUNTER — Encounter: Payer: Self-pay | Admitting: Family Medicine

## 2017-08-15 ENCOUNTER — Ambulatory Visit (INDEPENDENT_AMBULATORY_CARE_PROVIDER_SITE_OTHER): Payer: Medicare Other | Admitting: Family Medicine

## 2017-08-15 VITALS — BP 120/80 | HR 80 | Temp 98.3°F | Ht 63.0 in | Wt 194.0 lb

## 2017-08-15 DIAGNOSIS — J0100 Acute maxillary sinusitis, unspecified: Secondary | ICD-10-CM | POA: Diagnosis not present

## 2017-08-15 DIAGNOSIS — J40 Bronchitis, not specified as acute or chronic: Secondary | ICD-10-CM | POA: Diagnosis not present

## 2017-08-15 DIAGNOSIS — E118 Type 2 diabetes mellitus with unspecified complications: Secondary | ICD-10-CM

## 2017-08-15 MED ORDER — AMOXICILLIN-POT CLAVULANATE 875-125 MG PO TABS: 1.0000 | ORAL_TABLET | Freq: Two times a day (BID) | ORAL | 0 refills | Status: DC

## 2017-08-15 MED ORDER — GUAIFENESIN-CODEINE 100-10 MG/5ML PO SYRP: 5.0000 mL | ORAL_SOLUTION | Freq: Three times a day (TID) | ORAL | 0 refills | Status: DC | PRN

## 2017-08-15 NOTE — Progress Notes (Signed)
Name: Frances Harmon   MRN: [id number]    DOB: 16-May-1946   Date:08/15/2017       Progress Note  Subjective  Chief Complaint  Chief Complaint  Patient presents with  . Sinusitis    cough with cong, ears hurting, eyes watering    Sinusitis  This is a new problem. The current episode started in the past 7 days (onset 2 days previous). The problem has been gradually worsening since onset. There has been no fever (felt warm per patient). The pain is mild. Associated symptoms include congestion, coughing, ear pain, headaches, a hoarse voice, sinus pressure, sneezing, a sore throat and swollen glands. Pertinent negatives include no chills, diaphoresis, neck pain or shortness of breath. (Productive yellow) Past treatments include oral decongestants. The treatment provided no relief.  Cough  This is a chronic problem. The current episode started more than 1 year ago. The problem has been unchanged. The cough is productive of purulent sputum. Associated symptoms include ear pain, headaches, nasal congestion, rhinorrhea and a sore throat. Pertinent negatives include no chest pain, chills, fever, heartburn, hemoptysis, myalgias, rash, shortness of breath, weight loss or wheezing. The symptoms are aggravated by cold air. She has tried nothing for the symptoms. The treatment provided moderate relief. There is no history of environmental allergies.    No problem-specific Assessment & Plan notes found for this encounter.   Past Medical History:  Diagnosis Date  . Diabetes mellitus without complication ([place])   . GERD (gastroesophageal reflux disease)   . Hyperlipidemia   . Hypertension     Past Surgical History:  Procedure Laterality Date  . COLONOSCOPY  2009   repeat in 5 years- Dr Bary Leriche  . COLONOSCOPY WITH PROPOFOL N/A 09/09/2015   Procedure: COLONOSCOPY WITH PROPOFOL;  Surgeon: Hulen Luster, MD;  Location: [facility] ENDOSCOPY;  Service: Gastroenterology;  Laterality: N/A;  . FOOT SURGERY Bilateral     Bunion removal  . INCONTINENCE SURGERY    . VAGINAL HYSTERECTOMY    . VENTRAL HERNIA REPAIR N/A 05/06/2015   Procedure: HERNIA REPAIR VENTRAL ADULT;  Surgeon: Leonie Green, MD;  Location: [facility] ORS;  Service: General;  Laterality: N/A;    Family History  Problem Relation Age of Onset  . Breast cancer Daughter 69  . Breast cancer Mother 12  . Hypertension Brother   . Diabetes Brother   . Arthritis Brother     Social History   Socioeconomic History  . Marital status: Married    Spouse name: Not on file  . Number of children: Not on file  . Years of education: Not on file  . Highest education level: Not on file  Social Needs  . Financial resource strain: Not on file  . Food insecurity - worry: Not on file  . Food insecurity - inability: Not on file  . Transportation needs - medical: Not on file  . Transportation needs - non-medical: Not on file  Occupational History  . Not on file  Tobacco Use  . Smoking status: Never Smoker  . Smokeless tobacco: Never Used  Substance and Sexual Activity  . Alcohol use: No    Alcohol/week: 0.0 oz  . Drug use: No  . Sexual activity: No  Other Topics Concern  . Not on file  Social History Narrative  . Not on file    No Known Allergies  Outpatient Medications Prior to Visit  Medication Sig Dispense Refill  . atorvastatin (LIPITOR) 10 MG tablet Take 1 tablet (10 mg  total) by mouth daily at 6 (six) AM. 90 tablet 1  . glipiZIDE (GLUCOTROL XL) 2.5 MG 24 hr tablet TAKE ONE TABLET BY MOUTH DAILY AT THE SAME TIME EVERY MORNING 90 tablet 0  . losartan-hydrochlorothiazide (HYZAAR) 100-25 MG tablet TAKE (1) TABLET BY MOUTH EVERY DAY AT 6 IN THE MORNING 90 tablet 1  . metFORMIN (GLUCOPHAGE) 500 MG tablet TAKE (1) TABLET BY MOUTH TWICE DAILY 180 tablet 1  . omeprazole (PRILOSEC) 20 MG capsule TAKE 1 CAPSULE BY MOUTH EVERY MORNING ATTHE SAME TIME 90 capsule 1   No facility-administered medications prior to visit.     Review of Systems   Constitutional: Negative for chills, diaphoresis, fever, malaise/fatigue and weight loss.  HENT: Positive for congestion, ear pain, hoarse voice, rhinorrhea, sinus pressure, sneezing and sore throat. Negative for ear discharge.   Eyes: Negative for blurred vision.  Respiratory: Positive for cough. Negative for hemoptysis, sputum production, shortness of breath and wheezing.   Cardiovascular: Negative for chest pain, palpitations and leg swelling.  Gastrointestinal: Negative for abdominal pain, blood in stool, constipation, diarrhea, heartburn, melena and nausea.  Genitourinary: Negative for dysuria, frequency, hematuria and urgency.  Musculoskeletal: Negative for back pain, joint pain, myalgias and neck pain.  Skin: Negative for rash.  Neurological: Positive for headaches. Negative for dizziness, tingling, sensory change and focal weakness.  Endo/Heme/Allergies: Negative for environmental allergies and polydipsia. Does not bruise/bleed easily.  Psychiatric/Behavioral: Negative for depression and suicidal ideas. The patient is not nervous/anxious and does not have insomnia.      Objective  Vitals:   08/15/17 0919  BP: 120/80  Pulse: 80  Temp: 98.3 F (36.8 C)  TempSrc: Oral  SpO2: 98%  Weight: 194 lb (88 kg)  Height: 5\' 3"  (1.6 m)    Physical Exam  Constitutional: She is well-developed, well-nourished, and in no distress. No distress.  HENT:  Head: Normocephalic and atraumatic.  Right Ear: External ear normal.  Left Ear: External ear normal.  Nose: Nose normal.  Mouth/Throat: Oropharynx is clear and moist.  Eyes: Conjunctivae and EOM are normal. Pupils are equal, round, and reactive to light. Right eye exhibits no discharge. Left eye exhibits no discharge.  Neck: Normal range of motion. Neck supple. No JVD present. No thyromegaly present.  Cardiovascular: Normal rate, regular rhythm, normal heart sounds and intact distal pulses. Exam reveals no gallop and no friction rub.  No  murmur heard. Pulmonary/Chest: Effort normal and breath sounds normal. She has no wheezes. She has no rales.  Abdominal: Soft. Bowel sounds are normal. She exhibits no mass. There is no tenderness. There is no guarding.  Musculoskeletal: Normal range of motion. She exhibits no edema.  Lymphadenopathy:    She has no cervical adenopathy.  Neurological: She is alert. She has normal reflexes.  Monofilament normal  Skin: Skin is warm and dry. She is not diaphoretic.  Psychiatric: Mood and affect normal.  Nursing note and vitals reviewed.     Assessment & Plan  Problem List Items Addressed This Visit      Endocrine   Type 2 diabetes mellitus with complication, without long-term current use of insulin (HCC)   Relevant Orders   Hemoglobin A1c    Other Visit Diagnoses    Acute maxillary sinusitis, recurrence not specified    -  Primary   Relevant Medications   amoxicillin-clavulanate (AUGMENTIN) 875-125 MG tablet   guaiFENesin-codeine (ROBITUSSIN AC) 100-10 MG/5ML syrup   Bronchitis       Relevant Medications   amoxicillin-clavulanate (  AUGMENTIN) 875-125 MG tablet   guaiFENesin-codeine (ROBITUSSIN AC) 100-10 MG/5ML syrup      Meds ordered this encounter  Medications  . amoxicillin-clavulanate (AUGMENTIN) 875-125 MG tablet    Sig: Take 1 tablet by mouth 2 (two) times daily.    Dispense:  20 tablet    Refill:  0  . guaiFENesin-codeine (ROBITUSSIN AC) 100-10 MG/5ML syrup    Sig: Take 5 mLs by mouth 3 (three) times daily as needed for cough.    Dispense:  100 mL    Refill:  0      Dr. Otilio Miu [facility]  08/15/17

## 2017-08-16 LAB — HEMOGLOBIN A1C
Est. average glucose Bld gHb Est-mCnc: 117 mg/dL
Hgb A1c MFr Bld: 5.7 % — ABNORMAL HIGH (ref 4.8–5.6)

## 2017-09-12 ENCOUNTER — Ambulatory Visit (INDEPENDENT_AMBULATORY_CARE_PROVIDER_SITE_OTHER): Payer: Medicare Other | Admitting: Family Medicine

## 2017-09-12 ENCOUNTER — Encounter: Payer: Self-pay | Admitting: Family Medicine

## 2017-09-12 VITALS — BP 120/84 | HR 64 | Ht 63.0 in | Wt 195.0 lb

## 2017-09-12 DIAGNOSIS — E559 Vitamin D deficiency, unspecified: Secondary | ICD-10-CM | POA: Diagnosis not present

## 2017-09-12 DIAGNOSIS — E785 Hyperlipidemia, unspecified: Secondary | ICD-10-CM | POA: Diagnosis not present

## 2017-09-12 DIAGNOSIS — K219 Gastro-esophageal reflux disease without esophagitis: Secondary | ICD-10-CM

## 2017-09-12 DIAGNOSIS — I10 Essential (primary) hypertension: Secondary | ICD-10-CM

## 2017-09-12 DIAGNOSIS — M7541 Impingement syndrome of right shoulder: Secondary | ICD-10-CM

## 2017-09-12 MED ORDER — LOSARTAN POTASSIUM-HCTZ 100-25 MG PO TABS: ORAL_TABLET | ORAL | 1 refills | Status: DC

## 2017-09-12 MED ORDER — NAPROXEN 250 MG PO TABS: 250.0000 mg | ORAL_TABLET | Freq: Two times a day (BID) | ORAL | 1 refills | Status: DC

## 2017-09-12 MED ORDER — ATORVASTATIN CALCIUM 10 MG PO TABS: 10.0000 mg | ORAL_TABLET | Freq: Every day | ORAL | 1 refills | Status: DC

## 2017-09-12 MED ORDER — OMEPRAZOLE 20 MG PO CPDR
DELAYED_RELEASE_CAPSULE | ORAL | 1 refills | Status: DC
Start: 2017-09-12 — End: 2018-04-09

## 2017-09-12 NOTE — Progress Notes (Signed)
Name: Frances Harmon   MRN: [id number]    DOB: 11-May-1946   Date:09/12/2017       Progress Note  Subjective  Chief Complaint  Chief Complaint  Patient presents with  . Hyperlipidemia  . Hypertension  . Gastroesophageal Reflux    Hyperlipidemia  This is a chronic problem. The current episode started more than 1 year ago. The problem is controlled. Recent lipid tests were reviewed and are normal. She has no history of chronic renal disease, diabetes, hypothyroidism, liver disease, obesity or nephrotic syndrome. Factors aggravating her hyperlipidemia include thiazides. Pertinent negatives include no chest pain, focal weakness, myalgias or shortness of breath. Current antihyperlipidemic treatment includes statins. The current treatment provides moderate improvement of lipids. There are no compliance problems.  Risk factors for coronary artery disease include diabetes mellitus, dyslipidemia, post-menopausal and hypertension.  Hypertension  This is a chronic problem. The current episode started more than 1 year ago. The problem is unchanged. The problem is controlled. Pertinent negatives include no anxiety, blurred vision, chest pain, headaches, malaise/fatigue, neck pain, orthopnea, palpitations, peripheral edema, PND, shortness of breath or sweats. Past treatments include ACE inhibitors and diuretics. There is no history of angina, kidney disease, CAD/MI, CVA, heart failure, left ventricular hypertrophy, PVD or retinopathy. There is no history of chronic renal disease, a hypertension causing med or renovascular disease.  Gastroesophageal Reflux  She reports no abdominal pain, no belching, no chest pain, no coughing, no heartburn, no hoarse voice, no nausea, no sore throat or no wheezing. The problem occurs rarely. The problem has been unchanged. The symptoms are aggravated by certain foods. Pertinent negatives include no anemia, fatigue, melena, muscle weakness, orthopnea or weight loss. She has  tried a PPI for the symptoms. The treatment provided moderate relief.  Shoulder Pain   The pain is present in the right shoulder. This is a new problem. The current episode started more than 1 month ago. The problem occurs daily. The problem has been waxing and waning. The quality of the pain is described as aching. The pain is mild. Pertinent negatives include no fever, inability to bear weight, itching or tingling. She has tried nothing for the symptoms. There is no history of diabetes.    No problem-specific Assessment & Plan notes found for this encounter.   Past Medical History:  Diagnosis Date  . Diabetes mellitus without complication ([place])   . GERD (gastroesophageal reflux disease)   . Hyperlipidemia   . Hypertension     Past Surgical History:  Procedure Laterality Date  . COLONOSCOPY  2009   repeat in 5 years- Dr Bary Leriche  . COLONOSCOPY WITH PROPOFOL N/A 09/09/2015   Procedure: COLONOSCOPY WITH PROPOFOL;  Surgeon: Hulen Luster, MD;  Location: [facility] ENDOSCOPY;  Service: Gastroenterology;  Laterality: N/A;  . FOOT SURGERY Bilateral    Bunion removal  . INCONTINENCE SURGERY    . VAGINAL HYSTERECTOMY    . VENTRAL HERNIA REPAIR N/A 05/06/2015   Procedure: HERNIA REPAIR VENTRAL ADULT;  Surgeon: Leonie Green, MD;  Location: [facility] ORS;  Service: General;  Laterality: N/A;    Family History  Problem Relation Age of Onset  . Breast cancer Daughter 43  . Breast cancer Mother 26  . Hypertension Brother   . Diabetes Brother   . Arthritis Brother     Social History   Socioeconomic History  . Marital status: Married    Spouse name: Not on file  . Number of children: Not on file  . Years of  education: Not on file  . Highest education level: Not on file  Social Needs  . Financial resource strain: Not on file  . Food insecurity - worry: Not on file  . Food insecurity - inability: Not on file  . Transportation needs - medical: Not on file  . Transportation needs -  non-medical: Not on file  Occupational History  . Not on file  Tobacco Use  . Smoking status: Never Smoker  . Smokeless tobacco: Never Used  Substance and Sexual Activity  . Alcohol use: No    Alcohol/week: 0.0 oz  . Drug use: No  . Sexual activity: No  Other Topics Concern  . Not on file  Social History Narrative  . Not on file    No Known Allergies  Outpatient Medications Prior to Visit  Medication Sig Dispense Refill  . glipiZIDE (GLUCOTROL XL) 2.5 MG 24 hr tablet TAKE ONE TABLET BY MOUTH DAILY AT THE SAME TIME EVERY MORNING 90 tablet 0  . metFORMIN (GLUCOPHAGE) 500 MG tablet TAKE (1) TABLET BY MOUTH TWICE DAILY 180 tablet 1  . atorvastatin (LIPITOR) 10 MG tablet Take 1 tablet (10 mg total) by mouth daily at 6 (six) AM. 90 tablet 1  . losartan-hydrochlorothiazide (HYZAAR) 100-25 MG tablet TAKE (1) TABLET BY MOUTH EVERY DAY AT 6 IN THE MORNING 90 tablet 1  . omeprazole (PRILOSEC) 20 MG capsule TAKE 1 CAPSULE BY MOUTH EVERY MORNING ATTHE SAME TIME 90 capsule 1  . amoxicillin-clavulanate (AUGMENTIN) 875-125 MG tablet Take 1 tablet by mouth 2 (two) times daily. 20 tablet 0  . guaiFENesin-codeine (ROBITUSSIN AC) 100-10 MG/5ML syrup Take 5 mLs by mouth 3 (three) times daily as needed for cough. 100 mL 0   No facility-administered medications prior to visit.     Review of Systems  Constitutional: Negative for chills, fatigue, fever, malaise/fatigue and weight loss.  HENT: Negative for ear discharge, ear pain, hoarse voice and sore throat.   Eyes: Negative for blurred vision.  Respiratory: Negative for cough, sputum production, shortness of breath and wheezing.   Cardiovascular: Negative for chest pain, palpitations, orthopnea, leg swelling and PND.  Gastrointestinal: Negative for abdominal pain, blood in stool, constipation, diarrhea, heartburn, melena and nausea.  Genitourinary: Negative for dysuria, frequency, hematuria and urgency.  Musculoskeletal: Negative for back pain,  joint pain, myalgias, muscle weakness and neck pain.  Skin: Negative for itching and rash.  Neurological: Negative for dizziness, tingling, sensory change, focal weakness and headaches.  Endo/Heme/Allergies: Negative for environmental allergies and polydipsia. Does not bruise/bleed easily.  Psychiatric/Behavioral: Negative for depression and suicidal ideas. The patient is not nervous/anxious and does not have insomnia.      Objective  Vitals:   09/12/17 1417  BP: 120/84  Pulse: 64  Weight: 195 lb (88.5 kg)  Height: 5\' 3"  (1.6 m)    Physical Exam  Constitutional: She is well-developed, well-nourished, and in no distress. No distress.  HENT:  Head: Normocephalic and atraumatic.  Right Ear: External ear normal.  Left Ear: External ear normal.  Nose: Nose normal.  Mouth/Throat: Oropharynx is clear and moist.  Eyes: Conjunctivae and EOM are normal. Pupils are equal, round, and reactive to light. Right eye exhibits no discharge. Left eye exhibits no discharge.  Neck: Normal range of motion. Neck supple. No JVD present. No thyromegaly present.  Cardiovascular: Normal rate, regular rhythm, normal heart sounds and intact distal pulses. Exam reveals no gallop and no friction rub.  No murmur heard. Pulmonary/Chest: Effort normal and breath sounds  normal. She has no wheezes. She has no rales.  Abdominal: Soft. Bowel sounds are normal. She exhibits no mass. There is no tenderness. There is no guarding.  Musculoskeletal: Normal range of motion. She exhibits no edema.  Lymphadenopathy:    She has no cervical adenopathy.  Neurological: She is alert. She has normal reflexes.  Skin: Skin is warm and dry. She is not diaphoretic.  Psychiatric: Mood and affect normal.  Nursing note and vitals reviewed.     Assessment & Plan  Problem List Items Addressed This Visit      Cardiovascular and Mediastinum   Essential hypertension - Primary   Relevant Medications   losartan-hydrochlorothiazide  (HYZAAR) 100-25 MG tablet   atorvastatin (LIPITOR) 10 MG tablet   Other Relevant Orders   Renal Function Panel     Digestive   Esophageal reflux   Relevant Medications   omeprazole (PRILOSEC) 20 MG capsule     Other   Hyperlipidemia   Relevant Medications   losartan-hydrochlorothiazide (HYZAAR) 100-25 MG tablet   atorvastatin (LIPITOR) 10 MG tablet   Other Relevant Orders   Lipid Profile   Vitamin D deficiency    Other Visit Diagnoses    Impingement syndrome of right shoulder       NEW   Relevant Medications   naproxen (NAPROSYN) 250 MG tablet      Meds ordered this encounter  Medications  . losartan-hydrochlorothiazide (HYZAAR) 100-25 MG tablet    Sig: TAKE (1) TABLET BY MOUTH EVERY DAY AT 6 IN THE MORNING    Dispense:  90 tablet    Refill:  1  . omeprazole (PRILOSEC) 20 MG capsule    Sig: TAKE 1 CAPSULE BY MOUTH EVERY MORNING ATTHE SAME TIME    Dispense:  90 capsule    Refill:  1  . atorvastatin (LIPITOR) 10 MG tablet    Sig: Take 1 tablet (10 mg total) by mouth daily at 6 (six) AM.    Dispense:  90 tablet    Refill:  1  . naproxen (NAPROSYN) 250 MG tablet    Sig: Take 1 tablet (250 mg total) by mouth 2 (two) times daily with a meal.    Dispense:  60 tablet    Refill:  1      Dr. Deanna Jones [facility]  09/12/17

## 2017-09-13 LAB — RENAL FUNCTION PANEL
Albumin: 4.4 g/dL (ref 3.6–4.8)
BUN / CREAT RATIO: 16 (ref 12–28)
BUN: 12 mg/dL (ref 8–27)
CO2: 24 mmol/L (ref 20–29)
CREATININE: 0.75 mg/dL (ref 0.57–1.00)
Calcium: 9.5 mg/dL (ref 8.7–10.3)
Chloride: 106 mmol/L (ref 96–106)
GFR calc Af Amer: 94 mL/min/{1.73_m2} (ref >59)
GFR, EST NON AFRICAN AMERICAN: 82 mL/min/{1.73_m2} (ref >59)
Glucose: 96 mg/dL (ref 65–99)
Phosphorus: 3.4 mg/dL (ref 2.5–4.5)
Potassium: 4.3 mmol/L (ref 3.5–5.2)
Sodium: 145 mmol/L — ABNORMAL HIGH (ref 134–144)

## 2017-09-13 LAB — LIPID PANEL
Chol/HDL Ratio: 2.4 ratio (ref 0.0–4.4)
Cholesterol, Total: 150 mg/dL (ref 100–199)
HDL: 62 mg/dL (ref >39)
LDL CALC: 72 mg/dL (ref 0–99)
Triglycerides: 80 mg/dL (ref 0–149)
VLDL CHOLESTEROL CAL: 16 mg/dL (ref 5–40)

## 2017-09-20 DIAGNOSIS — H1045 Other chronic allergic conjunctivitis: Secondary | ICD-10-CM | POA: Diagnosis not present

## 2017-09-20 DIAGNOSIS — E119 Type 2 diabetes mellitus without complications: Secondary | ICD-10-CM | POA: Diagnosis not present

## 2017-09-20 DIAGNOSIS — H02886 Meibomian gland dysfunction of left eye, unspecified eyelid: Secondary | ICD-10-CM | POA: Diagnosis not present

## 2017-09-20 DIAGNOSIS — H02883 Meibomian gland dysfunction of right eye, unspecified eyelid: Secondary | ICD-10-CM | POA: Diagnosis not present

## 2017-09-27 ENCOUNTER — Other Ambulatory Visit: Payer: Self-pay | Admitting: Family Medicine

## 2017-09-27 DIAGNOSIS — E119 Type 2 diabetes mellitus without complications: Secondary | ICD-10-CM

## 2017-10-09 ENCOUNTER — Other Ambulatory Visit: Payer: Self-pay

## 2017-10-31 ENCOUNTER — Other Ambulatory Visit: Payer: Self-pay

## 2017-11-04 ENCOUNTER — Other Ambulatory Visit: Payer: Self-pay | Admitting: Family Medicine

## 2017-11-04 DIAGNOSIS — Z1231 Encounter for screening mammogram for malignant neoplasm of breast: Secondary | ICD-10-CM

## 2017-12-06 ENCOUNTER — Telehealth: Payer: Self-pay | Admitting: Family Medicine

## 2017-12-06 NOTE — Telephone Encounter (Signed)
Called to schedule Medicare Annual Wellness Visit with Nurse Health Advisor. If patient returns call, please note: their last AWV was on 7/2 /18 please schedule AWV with NHA any date after January 21 2018  Thank you! For any questions please contact: Jill Alexanders [phone]  Skype [email or web address]

## 2017-12-17 ENCOUNTER — Ambulatory Visit
Admission: RE | Admit: 2017-12-17 | Discharge: 2017-12-17 | Disposition: A | Discharge status: Home / Self Care | Payer: Medicare Other | Source: Ambulatory Visit | Attending: Family Medicine | Admitting: Family Medicine

## 2017-12-17 DIAGNOSIS — Z1231 Encounter for screening mammogram for malignant neoplasm of breast: Secondary | ICD-10-CM | POA: Diagnosis not present

## 2018-01-02 ENCOUNTER — Other Ambulatory Visit: Payer: Self-pay | Admitting: Family Medicine

## 2018-01-02 DIAGNOSIS — E119 Type 2 diabetes mellitus without complications: Secondary | ICD-10-CM

## 2018-01-13 ENCOUNTER — Other Ambulatory Visit: Payer: Self-pay | Admitting: Family Medicine

## 2018-01-13 DIAGNOSIS — E785 Hyperlipidemia, unspecified: Secondary | ICD-10-CM

## 2018-02-26 ENCOUNTER — Ambulatory Visit (INDEPENDENT_AMBULATORY_CARE_PROVIDER_SITE_OTHER): Payer: Medicare Other

## 2018-02-26 VITALS — BP 130/70 | HR 67 | Temp 98.3°F | Resp 12 | Ht 63.0 in | Wt 202.0 lb

## 2018-02-26 DIAGNOSIS — E119 Type 2 diabetes mellitus without complications: Secondary | ICD-10-CM | POA: Diagnosis not present

## 2018-02-26 DIAGNOSIS — Z0000 Encounter for general adult medical examination without abnormal findings: Secondary | ICD-10-CM

## 2018-02-26 DIAGNOSIS — I10 Essential (primary) hypertension: Secondary | ICD-10-CM | POA: Diagnosis not present

## 2018-02-26 DIAGNOSIS — E2839 Other primary ovarian failure: Secondary | ICD-10-CM | POA: Diagnosis not present

## 2018-02-26 DIAGNOSIS — E782 Mixed hyperlipidemia: Secondary | ICD-10-CM | POA: Diagnosis not present

## 2018-02-26 NOTE — Patient Instructions (Signed)
Frances Harmon , Thank you for taking time to come for your Medicare Wellness Visit. I appreciate your ongoing commitment to your health goals. Please review the following plan we discussed and let me know if I can assist you in the future.   Screening recommendations/referrals: Colorectal Screening: Up to date Mammogram: Up to date Bone Density: Ordered today  Vision and Dental Exams: Recommended annual ophthalmology exams for early detection of glaucoma and other disorders of the eye Recommended annual dental exams for proper oral hygiene  Diabetic Exams: Recommended annual diabetic eye exams for early detection of retinopathy Recommended annual diabetic foot exams for early detection of peripheral neuropathy.  Diabetic Eye Exam: Up to date Diabetic Foot Exam: Up to date  Vaccinations: Influenza vaccine: Up t odate Pneumococcal vaccine: Up to date Tdap vaccine: Up to date Shingles vaccine: Please call your insurance company to determine your out of pocket expense for the Shingrix vaccine. You may also receive this vaccine at your local pharmacy or Health Dept.    Advanced directives: Please bring a copy of your POA (Power of Attorney) and/or Living Will to your next appointment.  Goals: Recommend to drink at least 6-8 8oz glasses of water per day.  Next appointment: Please schedule your Annual Wellness Visit with your Nurse Health Advisor in one year.  Preventive Care 67 Years and Older, Female Preventive care refers to lifestyle choices and visits with your health care provider that can promote health and wellness. What does preventive care include?  A yearly physical exam. This is also called an annual well check.  Dental exams once or twice a year.  Routine eye exams. Ask your health care provider how often you should have your eyes checked.  Personal lifestyle choices, including:  Daily care of your teeth and gums.  Regular physical activity.  Eating a healthy  diet.  Avoiding tobacco and drug use.  Limiting alcohol use.  Practicing safe sex.  Taking low-dose aspirin every day.  Taking vitamin and mineral supplements as recommended by your health care provider. What happens during an annual well check? The services and screenings done by your health care provider during your annual well check will depend on your age, overall health, lifestyle risk factors, and family history of disease. Counseling  Your health care provider may ask you questions about your:  Alcohol use.  Tobacco use.  Drug use.  Emotional well-being.  Home and relationship well-being.  Sexual activity.  Eating habits.  History of falls.  Memory and ability to understand (cognition).  Work and work Statistician.  Reproductive health. Screening  You may have the following tests or measurements:  Height, weight, and BMI.  Blood pressure.  Lipid and cholesterol levels. These may be checked every 5 years, or more frequently if you are over 79 years old.  Skin check.  Lung cancer screening. You may have this screening every year starting at age 60 if you have a 30-pack-year history of smoking and currently smoke or have quit within the past 15 years.  Fecal occult blood test (FOBT) of the stool. You may have this test every year starting at age 75.  Flexible sigmoidoscopy or colonoscopy. You may have a sigmoidoscopy every 5 years or a colonoscopy every 10 years starting at age 8.  Hepatitis C blood test.  Hepatitis B blood test.  Sexually transmitted disease (STD) testing.  Diabetes screening. This is done by checking your blood sugar (glucose) after you have not eaten for a while (fasting).  You may have this done every 1-3 years.  Bone density scan. This is done to screen for osteoporosis. You may have this done starting at age 38.  Mammogram. This may be done every 1-2 years. Talk to your health care provider about how often you should have  regular mammograms. Talk with your health care provider about your test results, treatment options, and if necessary, the need for more tests. Vaccines  Your health care provider may recommend certain vaccines, such as:  Influenza vaccine. This is recommended every year.  Tetanus, diphtheria, and acellular pertussis (Tdap, Td) vaccine. You may need a Td booster every 10 years.  Zoster vaccine. You may need this after age 83.  Pneumococcal 13-valent conjugate (PCV13) vaccine. One dose is recommended after age 20.  Pneumococcal polysaccharide (PPSV23) vaccine. One dose is recommended after age 5. Talk to your health care provider about which screenings and vaccines you need and how often you need them. This information is not intended to replace advice given to you by your health care provider. Make sure you discuss any questions you have with your health care provider. Document Released: 08/05/2015 Document Revised: 03/28/2016 Document Reviewed: 05/10/2015 Elsevier Interactive Patient Education  2017 [place] Prevention in the Home Falls can cause injuries. They can happen to people of all ages. There are many things you can do to make your home safe and to help prevent falls. What can I do on the outside of my home?  Regularly fix the edges of walkways and driveways and fix any cracks.  Remove anything that might make you trip as you walk through a door, such as a raised step or threshold.  Trim any bushes or trees on the path to your home.  Use bright outdoor lighting.  Clear any walking paths of anything that might make someone trip, such as rocks or tools.  Regularly check to see if handrails are loose or broken. Make sure that both sides of any steps have handrails.  Any raised decks and porches should have guardrails on the edges.  Have any leaves, snow, or ice cleared regularly.  Use sand or salt on walking paths during winter.  Clean up any spills in your  garage right away. This includes oil or grease spills. What can I do in the bathroom?  Use night lights.  Install grab bars by the toilet and in the tub and shower. Do not use towel bars as grab bars.  Use non-skid mats or decals in the tub or shower.  If you need to sit down in the shower, use a plastic, non-slip stool.  Keep the floor dry. Clean up any water that spills on the floor as soon as it happens.  Remove soap buildup in the tub or shower regularly.  Attach bath mats securely with double-sided non-slip rug tape.  Do not have throw rugs and other things on the floor that can make you trip. What can I do in the bedroom?  Use night lights.  Make sure that you have a light by your bed that is easy to reach.  Do not use any sheets or blankets that are too big for your bed. They should not hang down onto the floor.  Have a firm chair that has side arms. You can use this for support while you get dressed.  Do not have throw rugs and other things on the floor that can make you trip. What can I do in the kitchen?  Clean up any spills right away.  Avoid walking on wet floors.  Keep items that you use a lot in easy-to-reach places.  If you need to reach something above you, use a strong step stool that has a grab bar.  Keep electrical cords out of the way.  Do not use floor polish or wax that makes floors slippery. If you must use wax, use non-skid floor wax.  Do not have throw rugs and other things on the floor that can make you trip. What can I do with my stairs?  Do not leave any items on the stairs.  Make sure that there are handrails on both sides of the stairs and use them. Fix handrails that are broken or loose. Make sure that handrails are as long as the stairways.  Check any carpeting to make sure that it is firmly attached to the stairs. Fix any carpet that is loose or worn.  Avoid having throw rugs at the top or bottom of the stairs. If you do have throw  rugs, attach them to the floor with carpet tape.  Make sure that you have a light switch at the top of the stairs and the bottom of the stairs. If you do not have them, ask someone to add them for you. What else can I do to help prevent falls?  Wear shoes that:  Do not have high heels.  Have rubber bottoms.  Are comfortable and fit you well.  Are closed at the toe. Do not wear sandals.  If you use a stepladder:  Make sure that it is fully opened. Do not climb a closed stepladder.  Make sure that both sides of the stepladder are locked into place.  Ask someone to hold it for you, if possible.  Clearly mark and make sure that you can see:  Any grab bars or handrails.  First and last steps.  Where the edge of each step is.  Use tools that help you move around (mobility aids) if they are needed. These include:  Canes.  Walkers.  Scooters.  Crutches.  Turn on the lights when you go into a dark area. Replace any light bulbs as soon as they burn out.  Set up your furniture so you have a clear path. Avoid moving your furniture around.  If any of your floors are uneven, fix them.  If there are any pets around you, be aware of where they are.  Review your medicines with your doctor. Some medicines can make you feel dizzy. This can increase your chance of falling. Ask your doctor what other things that you can do to help prevent falls. This information is not intended to replace advice given to you by your health care provider. Make sure you discuss any questions you have with your health care provider. Document Released: 05/05/2009 Document Revised: 12/15/2015 Document Reviewed: 08/13/2014 Elsevier Interactive Patient Education  2017 [employer].

## 2018-02-26 NOTE — Progress Notes (Signed)
Subjective:   Frances Harmon is a 72 y.o. female who presents for Medicare Annual (Subsequent) preventive examination.  Review of Systems:  N/A Cardiac Risk Factors include: advanced age (>76men, >66 women);diabetes mellitus;dyslipidemia;hypertension;obesity (BMI >30kg/m2);sedentary lifestyle     Objective:     Vitals: BP 130/70 (BP Location: Right Arm, Patient Position: Sitting, Cuff Size: Normal)   Pulse 67   Temp 98.3 F (36.8 C) (Oral)   Resp 12   Ht 5\' 3"  (1.6 m)   Wt 202 lb (91.6 kg)   SpO2 95%   BMI 35.78 kg/m   Body mass index is 35.78 kg/m.  Advanced Directives 02/26/2018 01/21/2017 05/06/2015 04/28/2015 03/07/2015  Does Patient Have a Medical Advance Directive? Yes Yes Yes Yes Yes  Type of Paramedic of [place];Living will [place];Living will [place];Living will Living will Living will  Does patient want to make changes to medical advance directive? - - No - Patient declined No - Patient declined -  Copy of [place] in Chart? No - copy requested No - copy requested No - copy requested - No - copy requested    Tobacco Social History   Tobacco Use  Smoking Status Never Smoker  Smokeless Tobacco Never Used  Tobacco Comment   smokin cessation materials not required     Counseling given: No Comment: smokin cessation materials not required  Clinical Intake:  Pre-visit preparation completed: Yes  Pain : No/denies pain   BMI - recorded: 35.78 Nutritional Status: BMI > 30  Obese Nutritional Risks: None  Nutrition Risk Assessment: Has the patient had any N/V/D within the last 2 months?  No Does the patient have any non-healing wounds?  No Has the patient had any unintentional weight loss or weight gain?  No  Is the patient diabetic?  Yes If diabetic, was a CBG obtained today?  No Did the patient bring in their glucometer from home?  No Comments: Pt monitors CBG's daily.  Denies any financial strains with the device or supplies.  Diabetic Exams: Diabetic Eye Exam: Completed 10/07/17.  Diabetic Foot Exam: Completed 08/15/17.   How often do you need to have someone help you when you read instructions, pamphlets, or other written materials from your doctor or pharmacy?: 1 - Never  Interpreter Needed?: No  Information entered by :: Idell Pickles, LPN  Past Medical History:  Diagnosis Date  . Diabetes mellitus without complication ([place])   . GERD (gastroesophageal reflux disease)   . Hyperlipidemia   . Hypertension    Past Surgical History:  Procedure Laterality Date  . COLONOSCOPY  2009   repeat in 5 years- Dr Bary Leriche  . COLONOSCOPY WITH PROPOFOL N/A 09/09/2015   Procedure: COLONOSCOPY WITH PROPOFOL;  Surgeon: Hulen Luster, MD;  Location: [facility] ENDOSCOPY;  Service: Gastroenterology;  Laterality: N/A;  . FOOT SURGERY Bilateral    Bunion removal  . INCONTINENCE SURGERY    . VAGINAL HYSTERECTOMY    . VENTRAL HERNIA REPAIR N/A 05/06/2015   Procedure: HERNIA REPAIR VENTRAL ADULT;  Surgeon: Leonie Green, MD;  Location: [facility] ORS;  Service: General;  Laterality: N/A;   Family History  Problem Relation Age of Onset  . Breast cancer Daughter 61  . Breast cancer Mother 4  . Hypertension Brother   . Diabetes Brother   . Arthritis Brother    Social History   Socioeconomic History  . Marital status: Married    Spouse name: Not on file  .  Number of children: 2  . Years of education: Not on file  . Highest education level: 12th grade  Occupational History  . Occupation: Retired  Scientific laboratory technician  . Financial resource strain: Not hard at all  . Food insecurity:    Worry: Never true    Inability: Never true  . Transportation needs:    Medical: No    Non-medical: No  Tobacco Use  . Smoking status: Never Smoker  . Smokeless tobacco: Never Used  . Tobacco comment: smokin cessation materials not required  Substance and Sexual Activity  . Alcohol use: No      Alcohol/week: 0.0 oz  . Drug use: No  . Sexual activity: Never  Lifestyle  . Physical activity:    Days per week: 0 days    Minutes per session: 0 min  . Stress: Not at all  Relationships  . Social connections:    Talks on phone: Patient refused    Gets together: Patient refused    Attends religious service: Patient refused    Active member of club or organization: Patient refused    Attends meetings of clubs or organizations: Patient refused    Relationship status: Patient refused  Other Topics Concern  . Not on file  Social History Narrative  . Not on file    Outpatient Encounter Medications as of 02/26/2018  Medication Sig  . atorvastatin (LIPITOR) 10 MG tablet TAKE (1) TABLET BY MOUTH EVERY DAY AT 6 IN THE MORNING  . glipiZIDE (GLUCOTROL XL) 2.5 MG 24 hr tablet TAKE ONE TABLET BY MOUTH DAILY AT THE SAME TIME EVERY MORNING  . losartan-hydrochlorothiazide (HYZAAR) 100-25 MG tablet TAKE (1) TABLET BY MOUTH EVERY DAY AT 6 IN THE MORNING  . metFORMIN (GLUCOPHAGE) 500 MG tablet TAKE (1) TABLET BY MOUTH TWICE DAILY  . naproxen (NAPROSYN) 250 MG tablet Take 1 tablet (250 mg total) by mouth 2 (two) times daily with a meal.  . omeprazole (PRILOSEC) 20 MG capsule TAKE 1 CAPSULE BY MOUTH EVERY MORNING ATTHE SAME TIME   No facility-administered encounter medications on file as of 02/26/2018.     Activities of Daily Living In your present state of health, do you have any difficulty performing the following activities: 02/26/2018  Hearing? N  Comment denies hearing aids  Vision? N  Comment wears eyeglasses  Difficulty concentrating or making decisions? N  Walking or climbing stairs? Y  Comment joint pain  Dressing or bathing? N  Doing errands, shopping? N  Preparing Food and eating ? N  Comment denies dentures  Using the Toilet? N  In the past six months, have you accidently leaked urine? N  Do you have problems with loss of bowel control? N  Managing your Medications? N   Managing your Finances? N  Housekeeping or managing your Housekeeping? N  Some recent data might be hidden    Patient Care Team: Juline Patch, MD as PCP - General (Family Medicine)    Assessment:   This is a routine wellness examination for Frances Harmon.  Exercise Activities and Dietary recommendations Current Exercise Habits: The patient does not participate in regular exercise at present, Exercise limited by: None identified  Goals    . DIET - INCREASE WATER INTAKE     Recommend to drink at least 6-8 8oz glasses of water per day.       Fall Risk Fall Risk  02/26/2018 01/21/2017 07/03/2016 04/28/2015 03/07/2015  Falls in the past year? No Yes No No No  Number  falls in past yr: - 1 - - -  Comment - fell backwards at work - - -  Injury with Fall? - Yes - - -  Comment - bruised - - -  Risk for fall due to : Impaired vision - - - -  Risk for fall due to: Comment wears eyeglasses - - - -  Follow up - Falls prevention discussed - - -   FALL RISK PREVENTION PERTAINING TO HOME: Is your home free of loose throw rugs in walkways, pet beds, electrical cords, etc? Yes Is there adequate lighting in your home to reduce risk of falls?  Yes Are there stairs in or around your home WITH handrails? No stairs  ASSISTIVE DEVICES UTILIZED TO PREVENT FALLS: Use of a cane, walker or w/c? No Grab bars in the bathroom? Yes  Shower chair or a place to sit while bathing? Yes An elevated toilet seat or a handicapped toilet? Yes  Timed Get Up and Go Performed: Yes. Pt ambulated 10 feet within 10 sec. Gait stead-fast and without the use of an assistive device. No intervention required at this time. Fall risk prevention has been discussed.  Community Resource Referral:  [place] Referral not required at this time.   Depression Screen PHQ 2/9 Scores 02/26/2018 01/21/2017 07/03/2016 04/28/2015  PHQ - 2 Score 0 0 0 0  PHQ- 9 Score 0 - - -     Cognitive Function     6CIT Screen 02/26/2018  01/21/2017  What Year? 0 points 0 points  What month? 0 points 0 points  What time? 0 points 0 points  Count back from 20 0 points 0 points  Months in reverse 0 points 0 points  Repeat phrase 0 points 0 points  Total Score 0 0    Immunization History  Administered Date(s) Administered  . Influenza, High Dose Seasonal PF 03/19/2017  . Influenza,inj,Quad PF,6+ Mos 04/28/2015    Qualifies for Shingles Vaccine? Yes. Due for Shingrix. Education has been provided regarding the importance of this vaccine. Pt has been advised to call insurance company to determine out of pocket expense. Advised may also receive vaccine at local pharmacy or Health Dept. Verbalized acceptance and understanding.  Screening Tests Health Maintenance  Topic Date Due  . HEMOGLOBIN A1C  02/12/2018  . INFLUENZA VACCINE  02/20/2018  . FOOT EXAM  08/15/2018  . OPHTHALMOLOGY EXAM  10/08/2018  . MAMMOGRAM  12/18/2018  . COLONOSCOPY  09/08/2025  . TETANUS/TDAP  12/05/2026  . DEXA SCAN  Completed  . Hepatitis C Screening  Completed  . PNA vac Low Risk Adult  Completed    Cancer Screenings: Lung: Low Dose CT Chest recommended if Age 45-80 years, 30 pack-year currently smoking OR have quit w/in 15years. Patient does not qualify. Breast:  Up to date on Mammogram? Yes. Completed 12/17/17. Repeat every year   Up to date of Bone Density/Dexa? No. Completed 04/21/13. Results reflect normal. Repeat every 2 years. Ordered today. Colorectal: Completed 09/09/15. Repeat every 10 years  Additional Screenings: Hepatitis C Screening: Completed 02/16/17    Plan:  I have personally reviewed and addressed the Medicare Annual Wellness questionnaire and have noted the following in the patient's chart:  A. Medical and social history B. Use of alcohol, tobacco or illicit drugs  C. Current medications and supplements D. Functional ability and status E.  Nutritional status F.  Physical activity G. Advance directives H. List of other  physicians I.  Hospitalizations, surgeries, and ER visits in previous  12 months J.  [place] such as hearing and vision if needed, cognitive and depression L. Referrals and appointments  In addition, I have reviewed and discussed with patient certain preventive protocols, quality metrics, and best practice recommendations. A written personalized care plan for preventive services as well as general preventive health recommendations were provided to patient.  Signed,  Aleatha Borer, LPN Nurse Health Advisor  MD Recommendations: Due for Shingrix. Education has been provided regarding the importance of this vaccine. Pt has been advised to call insurance company to determine out of pocket expense. Advised may also receive vaccine at local pharmacy or Health Dept. Verbalized acceptance and understanding.  Bone Density/Dexa: Completed 04/21/13. Results reflect normal. Repeat every 2 years. Ordered today.

## 2018-03-10 ENCOUNTER — Ambulatory Visit
Admission: RE | Admit: 2018-03-10 | Discharge: 2018-03-10 | Disposition: A | Discharge status: Home / Self Care | Payer: Medicare Other | Source: Ambulatory Visit | Attending: Family Medicine | Admitting: Family Medicine

## 2018-03-10 DIAGNOSIS — M8588 Other specified disorders of bone density and structure, other site: Secondary | ICD-10-CM | POA: Diagnosis not present

## 2018-03-10 DIAGNOSIS — E2839 Other primary ovarian failure: Secondary | ICD-10-CM | POA: Diagnosis not present

## 2018-03-11 ENCOUNTER — Encounter: Payer: Self-pay | Admitting: Podiatry

## 2018-03-11 ENCOUNTER — Ambulatory Visit: Payer: Medicare Other | Admitting: Podiatry

## 2018-03-11 VITALS — BP 165/83 | HR 66

## 2018-03-11 DIAGNOSIS — M7742 Metatarsalgia, left foot: Secondary | ICD-10-CM

## 2018-03-11 DIAGNOSIS — B351 Tinea unguium: Secondary | ICD-10-CM | POA: Diagnosis not present

## 2018-03-11 DIAGNOSIS — M7741 Metatarsalgia, right foot: Secondary | ICD-10-CM

## 2018-03-11 MED ORDER — TERBINAFINE HCL 250 MG PO TABS: 250.0000 mg | ORAL_TABLET | Freq: Every day | ORAL | 0 refills | Status: DC

## 2018-03-13 NOTE — Progress Notes (Signed)
   Subjective: 72 year old female presenting today as a new patient with a chief complaint of possible fungal nails of bilateral feet. She reports associated discoloration and tenderness of the nails. She also reports soreness to the ball of the left foot that began 3-4 months ago. She has not done anything for treatment. Walking and bearing weight increases the left foot pain. Patient is here for further evaluation and treatment.   Past Medical History:  Diagnosis Date  . Diabetes mellitus without complication ([place])   . GERD (gastroesophageal reflux disease)   . Hyperlipidemia   . Hypertension     Objective: Physical Exam General: The patient is alert and oriented x3 in no acute distress.  Dermatology: Hyperkeratotic, discolored, thickened, onychodystrophy of nails noted bilaterally. Skin is warm, dry and supple bilateral lower extremities. Negative for open lesions or macerations.  Vascular: Palpable pedal pulses bilaterally. No edema or erythema noted. Capillary refill within normal limits.  Neurological: Epicritic and protective threshold grossly intact bilaterally.   Musculoskeletal Exam: Pain with palpation noted along MPJs 1-5 of the left foot. Range of motion within normal limits to all pedal and ankle joints bilateral. Muscle strength 5/5 in all groups bilateral.   Assessment: #1 onychodystrophy bilateral toenails #2 possible onychomycosis #3 hyperkeratotic nails bilateral #4 metatarsalgia left foot   Plan of Care:  #1 Patient was evaluated. #2 Prescription for Lamisil 250 mg #90 provided to patient.  #3 Recommended OTC insoles with metatarsal pads.  #4 Return to clinic as needed.    Edrick Kins, DPM [facility]  Dr. Edrick Kins, [place]                                        [place],  [address]                Office [phone]  Fax [phone]

## 2018-04-09 ENCOUNTER — Ambulatory Visit: Payer: Medicare Other | Admitting: Family Medicine

## 2018-04-09 ENCOUNTER — Encounter: Payer: Self-pay | Admitting: Family Medicine

## 2018-04-09 VITALS — BP 120/80 | HR 62 | Ht 63.0 in | Wt 202.0 lb

## 2018-04-09 DIAGNOSIS — K219 Gastro-esophageal reflux disease without esophagitis: Secondary | ICD-10-CM

## 2018-04-09 DIAGNOSIS — E785 Hyperlipidemia, unspecified: Secondary | ICD-10-CM | POA: Diagnosis not present

## 2018-04-09 DIAGNOSIS — Z23 Encounter for immunization: Secondary | ICD-10-CM | POA: Diagnosis not present

## 2018-04-09 DIAGNOSIS — R69 Illness, unspecified: Secondary | ICD-10-CM

## 2018-04-09 DIAGNOSIS — E119 Type 2 diabetes mellitus without complications: Secondary | ICD-10-CM

## 2018-04-09 DIAGNOSIS — I10 Essential (primary) hypertension: Secondary | ICD-10-CM

## 2018-04-09 DIAGNOSIS — E118 Type 2 diabetes mellitus with unspecified complications: Secondary | ICD-10-CM

## 2018-04-09 MED ORDER — ATORVASTATIN CALCIUM 10 MG PO TABS: ORAL_TABLET | ORAL | 1 refills | Status: DC

## 2018-04-09 MED ORDER — METFORMIN HCL 500 MG PO TABS: ORAL_TABLET | ORAL | 0 refills | Status: DC

## 2018-04-09 MED ORDER — OMEPRAZOLE 20 MG PO CPDR
DELAYED_RELEASE_CAPSULE | ORAL | 1 refills | Status: DC
Start: 2018-04-09 — End: 2018-07-29

## 2018-04-09 MED ORDER — GLIPIZIDE ER 2.5 MG PO TB24: ORAL_TABLET | ORAL | 1 refills | Status: DC

## 2018-04-09 MED ORDER — LOSARTAN POTASSIUM-HCTZ 100-25 MG PO TABS: ORAL_TABLET | ORAL | 1 refills | Status: DC

## 2018-04-09 NOTE — Assessment & Plan Note (Signed)
Stable on med- continue Atorvastatin/ lipid panel

## 2018-04-09 NOTE — Progress Notes (Signed)
Name: Frances Harmon   MRN: [id number]    DOB: 09/03/1945   Date:04/09/2018       Progress Note  Subjective  Chief Complaint  Chief Complaint  Patient presents with  . Diabetes  . Hyperlipidemia  . Hypertension  . Gastroesophageal Reflux  . Flu Vaccine    Diabetes  She presents for her follow-up diabetic visit. She has type 2 diabetes mellitus. Her disease course has been stable. There are no hypoglycemic associated symptoms. Pertinent negatives for hypoglycemia include no dizziness, headaches, nervousness/anxiousness or sweats. Pertinent negatives for diabetes include no blurred vision, no chest pain, no fatigue, no foot paresthesias, no foot ulcerations, no polydipsia, no polyphagia, no polyuria, no visual change, no weakness and no weight loss. There are no hypoglycemic complications. Symptoms are stable. There are no diabetic complications. Pertinent negatives for diabetic complications include no CVA, PVD or retinopathy. Risk factors for coronary artery disease include dyslipidemia, diabetes mellitus, hypertension and obesity. Current diabetic treatment includes oral agent (dual therapy). She is compliant with treatment all of the time. Her weight is stable. She participates in exercise intermittently. There is no change in her home blood glucose trend. Her breakfast blood glucose is taken between 8-9 am. Her breakfast blood glucose range is generally 90-110 mg/dl. An ACE inhibitor/angiotensin II receptor blocker is being taken.  Hyperlipidemia  This is a chronic problem. The current episode started more than 1 year ago. The problem is controlled. Recent lipid tests were reviewed and are normal. She has no history of chronic renal disease, diabetes, hypothyroidism, liver disease, obesity or nephrotic syndrome. Pertinent negatives include no chest pain, focal sensory loss, focal weakness, leg pain, myalgias or shortness of breath. Current antihyperlipidemic treatment includes statins. The  current treatment provides moderate improvement of lipids. There are no compliance problems.  Risk factors for coronary artery disease include hypertension, diabetes mellitus and dyslipidemia.  Hypertension  This is a chronic problem. The problem is unchanged. The problem is controlled. Pertinent negatives include no anxiety, blurred vision, chest pain, headaches, malaise/fatigue, neck pain, orthopnea, palpitations, peripheral edema, PND, shortness of breath or sweats. There are no associated agents to hypertension. Risk factors for coronary artery disease include diabetes mellitus, dyslipidemia and obesity. Past treatments include ACE inhibitors. The current treatment provides moderate improvement. There is no history of angina, kidney disease, CAD/MI, CVA, heart failure, left ventricular hypertrophy, PVD or retinopathy. There is no history of chronic renal disease, a hypertension causing med or renovascular disease.  Gastroesophageal Reflux  She reports no abdominal pain, no chest pain, no coughing, no heartburn, no nausea, no sore throat or no wheezing. Pertinent negatives include no fatigue, melena or weight loss.    Hyperlipidemia Stable on med- continue Atorvastatin/ lipid panel  Essential hypertension Stable on meds- continue losartan/ HCTZ- draw renal panel  Esophageal reflux Stable on meds- continue omeprazole  Type 2 diabetes mellitus with complication, without long-term current use of insulin (HCC) Stable on meds- continue glipizide and metformin/ draw A1C   Past Medical History:  Diagnosis Date  . Diabetes mellitus without complication ([place])   . GERD (gastroesophageal reflux disease)   . Hyperlipidemia   . Hypertension     Past Surgical History:  Procedure Laterality Date  . COLONOSCOPY  2009   repeat in 5 years- Dr Bary Leriche  . COLONOSCOPY WITH PROPOFOL N/A 09/09/2015   Procedure: COLONOSCOPY WITH PROPOFOL;  Surgeon: Hulen Luster, MD;  Location: [facility] ENDOSCOPY;  Service:  Gastroenterology;  Laterality: N/A;  . FOOT SURGERY  Bilateral    Bunion removal  . INCONTINENCE SURGERY    . VAGINAL HYSTERECTOMY    . VENTRAL HERNIA REPAIR N/A 05/06/2015   Procedure: HERNIA REPAIR VENTRAL ADULT;  Surgeon: Leonie Green, MD;  Location: [facility] ORS;  Service: General;  Laterality: N/A;    Family History  Problem Relation Age of Onset  . Breast cancer Daughter 41  . Breast cancer Mother 26  . Hypertension Brother   . Diabetes Brother   . Arthritis Brother     Social History   Socioeconomic History  . Marital status: Married    Spouse name: Not on file  . Number of children: 2  . Years of education: Not on file  . Highest education level: 12th grade  Occupational History  . Occupation: Retired  Scientific laboratory technician  . Financial resource strain: Not hard at all  . Food insecurity:    Worry: Never true    Inability: Never true  . Transportation needs:    Medical: No    Non-medical: No  Tobacco Use  . Smoking status: Never Smoker  . Smokeless tobacco: Never Used  . Tobacco comment: smokin cessation materials not required  Substance and Sexual Activity  . Alcohol use: No    Alcohol/week: 0.0 standard drinks  . Drug use: No  . Sexual activity: Never  Lifestyle  . Physical activity:    Days per week: 0 days    Minutes per session: 0 min  . Stress: Not at all  Relationships  . Social connections:    Talks on phone: Patient refused    Gets together: Patient refused    Attends religious service: Patient refused    Active member of club or organization: Patient refused    Attends meetings of clubs or organizations: Patient refused    Relationship status: Patient refused  . Intimate partner violence:    Fear of current or ex partner: No    Emotionally abused: No    Physically abused: No    Forced sexual activity: No  Other Topics Concern  . Not on file  Social History Narrative  . Not on file    No Known Allergies  Outpatient Medications Prior to  Visit  Medication Sig Dispense Refill  . naproxen (NAPROSYN) 250 MG tablet Take 1 tablet (250 mg total) by mouth 2 (two) times daily with a meal. 60 tablet 1  . terbinafine (LAMISIL) 250 MG tablet Take 1 tablet (250 mg total) by mouth daily. (Patient taking differently: Take 250 mg by mouth daily. Dr Amalia Hailey) 90 tablet 0  . atorvastatin (LIPITOR) 10 MG tablet TAKE (1) TABLET BY MOUTH EVERY DAY AT 6 IN THE MORNING 90 tablet 0  . glipiZIDE (GLUCOTROL XL) 2.5 MG 24 hr tablet TAKE ONE TABLET BY MOUTH DAILY AT THE SAME TIME EVERY MORNING 90 tablet 0  . losartan-hydrochlorothiazide (HYZAAR) 100-25 MG tablet TAKE (1) TABLET BY MOUTH EVERY DAY AT 6 IN THE MORNING 90 tablet 1  . metFORMIN (GLUCOPHAGE) 500 MG tablet TAKE (1) TABLET BY MOUTH TWICE DAILY 180 tablet 0  . omeprazole (PRILOSEC) 20 MG capsule TAKE 1 CAPSULE BY MOUTH EVERY MORNING ATTHE SAME TIME 90 capsule 1   No facility-administered medications prior to visit.     Review of Systems  Constitutional: Negative for chills, fatigue, fever, malaise/fatigue and weight loss.  HENT: Negative for ear discharge, ear pain and sore throat.   Eyes: Negative for blurred vision.  Respiratory: Negative for cough, sputum production, shortness of breath  and wheezing.   Cardiovascular: Negative for chest pain, palpitations, orthopnea, leg swelling and PND.  Gastrointestinal: Negative for abdominal pain, blood in stool, constipation, diarrhea, heartburn, melena and nausea.  Genitourinary: Negative for dysuria, frequency, hematuria and urgency.  Musculoskeletal: Negative for back pain, joint pain, myalgias and neck pain.  Skin: Negative for rash.  Neurological: Negative for dizziness, tingling, sensory change, focal weakness, weakness and headaches.  Endo/Heme/Allergies: Negative for environmental allergies, polydipsia and polyphagia. Does not bruise/bleed easily.  Psychiatric/Behavioral: Negative for depression and suicidal ideas. The patient is not  nervous/anxious and does not have insomnia.      Objective  Vitals:   04/09/18 1054  BP: 120/80  Pulse: 62  Weight: 202 lb (91.6 kg)  Height: 5\' 3"  (1.6 m)    Physical Exam  Constitutional: She is oriented to person, place, and time. She appears well-developed and well-nourished.  HENT:  Head: Normocephalic.  Right Ear: External ear normal.  Left Ear: External ear normal.  Mouth/Throat: Oropharynx is clear and moist.  Eyes: Pupils are equal, round, and reactive to light. Conjunctivae and EOM are normal. Lids are everted and swept, no foreign bodies found. Left eye exhibits no hordeolum. No foreign body present in the left eye. Right conjunctiva is not injected. Left conjunctiva is not injected. No scleral icterus.  Neck: Normal range of motion. Neck supple. No JVD present. No tracheal deviation present. No thyromegaly present.  Cardiovascular: Normal rate, regular rhythm, normal heart sounds and intact distal pulses. Exam reveals no gallop and no friction rub.  No murmur heard. Pulmonary/Chest: Effort normal and breath sounds normal. No respiratory distress. She has no wheezes. She has no rales.  Abdominal: Soft. Bowel sounds are normal. She exhibits no mass. There is no hepatosplenomegaly. There is no tenderness. There is no rebound and no guarding.  Musculoskeletal: Normal range of motion. She exhibits no edema or tenderness.  Lymphadenopathy:    She has no cervical adenopathy.  Neurological: She is alert and oriented to person, place, and time. She has normal strength. She displays normal reflexes. No cranial nerve deficit.  Skin: Skin is warm. No rash noted.  Psychiatric: She has a normal mood and affect. Her mood appears not anxious. She does not exhibit a depressed mood.  Nursing note and vitals reviewed.     Assessment & Plan  Problem List Items Addressed This Visit      Cardiovascular and Mediastinum   Essential hypertension    Stable on meds- continue losartan/  HCTZ- draw renal panel      Relevant Medications   atorvastatin (LIPITOR) 10 MG tablet   losartan-hydrochlorothiazide (HYZAAR) 100-25 MG tablet   Other Relevant Orders   Renal Function Panel     Digestive   Esophageal reflux    Stable on meds- continue omeprazole      Relevant Medications   omeprazole (PRILOSEC) 20 MG capsule     Endocrine   Type 2 diabetes mellitus with complication, without long-term current use of insulin (HCC)    Stable on meds- continue glipizide and metformin/ draw A1C      Relevant Medications   atorvastatin (LIPITOR) 10 MG tablet   glipiZIDE (GLUCOTROL XL) 2.5 MG 24 hr tablet   losartan-hydrochlorothiazide (HYZAAR) 100-25 MG tablet   metFORMIN (GLUCOPHAGE) 500 MG tablet     Other   Hyperlipidemia    Stable on med- continue Atorvastatin/ lipid panel      Relevant Medications   atorvastatin (LIPITOR) 10 MG tablet   losartan-hydrochlorothiazide (HYZAAR)  100-25 MG tablet   Other Relevant Orders   Lipid Panel With LDL/HDL Ratio    Other Visit Diagnoses    Type 2 diabetes mellitus without complication, without long-term current use of insulin (HCC)    -  Primary   Relevant Medications   atorvastatin (LIPITOR) 10 MG tablet   glipiZIDE (GLUCOTROL XL) 2.5 MG 24 hr tablet   losartan-hydrochlorothiazide (HYZAAR) 100-25 MG tablet   metFORMIN (GLUCOPHAGE) 500 MG tablet   Other Relevant Orders   HgB A1c   Taking medication for chronic disease       draw liver panel   Relevant Orders   Hepatic function panel   Flu vaccine need       administered   Relevant Orders   Flu vaccine HIGH DOSE PF (Completed)      Meds ordered this encounter  Medications  . atorvastatin (LIPITOR) 10 MG tablet    Sig: TAKE (1) TABLET BY MOUTH EVERY DAY AT 6 IN THE MORNING    Dispense:  90 tablet    Refill:  1  . glipiZIDE (GLUCOTROL XL) 2.5 MG 24 hr tablet    Sig: TAKE ONE TABLET BY MOUTH DAILY AT THE SAME TIME EVERY MORNING    Dispense:  90 tablet    Refill:  1   . losartan-hydrochlorothiazide (HYZAAR) 100-25 MG tablet    Sig: TAKE (1) TABLET BY MOUTH EVERY DAY AT 6 IN THE MORNING    Dispense:  90 tablet    Refill:  1  . metFORMIN (GLUCOPHAGE) 500 MG tablet    Sig: Take 1 tab BID    Dispense:  180 tablet    Refill:  0  . omeprazole (PRILOSEC) 20 MG capsule    Sig: TAKE 1 CAPSULE BY MOUTH EVERY MORNING ATTHE SAME TIME    Dispense:  90 capsule    Refill:  1      Dr. Adeliz Tonkinson [facility]  04/09/18

## 2018-04-09 NOTE — Assessment & Plan Note (Signed)
Stable on meds- continue losartan/ HCTZ- draw renal panel

## 2018-04-09 NOTE — Assessment & Plan Note (Signed)
Stable on meds- continue glipizide and metformin/ draw A1C

## 2018-04-09 NOTE — Assessment & Plan Note (Signed)
Stable on meds- continue omeprazole

## 2018-04-10 LAB — RENAL FUNCTION PANEL
ALBUMIN: 4 g/dL (ref 3.5–4.8)
BUN/Creatinine Ratio: 15 (ref 12–28)
BUN: 12 mg/dL (ref 8–27)
CO2: 26 mmol/L (ref 20–29)
CREATININE: 0.81 mg/dL (ref 0.57–1.00)
Calcium: 9.2 mg/dL (ref 8.7–10.3)
Chloride: 103 mmol/L (ref 96–106)
GFR, EST AFRICAN AMERICAN: 85 mL/min/{1.73_m2} (ref >59)
GFR, EST NON AFRICAN AMERICAN: 74 mL/min/{1.73_m2} (ref >59)
Glucose: 95 mg/dL (ref 65–99)
Phosphorus: 3.2 mg/dL (ref 2.5–4.5)
Potassium: 4.3 mmol/L (ref 3.5–5.2)
Sodium: 143 mmol/L (ref 134–144)

## 2018-04-10 LAB — LIPID PANEL WITH LDL/HDL RATIO
CHOLESTEROL TOTAL: 146 mg/dL (ref 100–199)
HDL: 49 mg/dL (ref >39)
LDL CALC: 70 mg/dL (ref 0–99)
LDL/HDL RATIO: 1.4 ratio (ref 0.0–3.2)
Triglycerides: 137 mg/dL (ref 0–149)
VLDL Cholesterol Cal: 27 mg/dL (ref 5–40)

## 2018-04-10 LAB — HEPATIC FUNCTION PANEL
ALT: 15 IU/L (ref 0–32)
AST: 15 IU/L (ref 0–40)
Alkaline Phosphatase: 72 IU/L (ref 39–117)
BILIRUBIN TOTAL: 0.5 mg/dL (ref 0.0–1.2)
BILIRUBIN, DIRECT: 0.14 mg/dL (ref 0.00–0.40)
Total Protein: 6.7 g/dL (ref 6.0–8.5)

## 2018-04-10 LAB — HEMOGLOBIN A1C
Est. average glucose Bld gHb Est-mCnc: 117 mg/dL
Hgb A1c MFr Bld: 5.7 % — ABNORMAL HIGH (ref 4.8–5.6)

## 2018-04-16 ENCOUNTER — Telehealth: Payer: Self-pay

## 2018-04-16 ENCOUNTER — Encounter: Payer: Self-pay | Admitting: Emergency Medicine

## 2018-04-16 ENCOUNTER — Ambulatory Visit (INDEPENDENT_AMBULATORY_CARE_PROVIDER_SITE_OTHER): Payer: Medicare Other

## 2018-04-16 ENCOUNTER — Other Ambulatory Visit: Payer: Self-pay

## 2018-04-16 ENCOUNTER — Ambulatory Visit
Admission: EM | Admit: 2018-04-16 | Discharge: 2018-04-16 | Disposition: A | Discharge status: Home / Self Care | Payer: Medicare Other | Attending: Family Medicine | Admitting: Family Medicine

## 2018-04-16 DIAGNOSIS — W19XXXA Unspecified fall, initial encounter: Secondary | ICD-10-CM

## 2018-04-16 DIAGNOSIS — M25562 Pain in left knee: Secondary | ICD-10-CM | POA: Diagnosis not present

## 2018-04-16 DIAGNOSIS — R2681 Unsteadiness on feet: Secondary | ICD-10-CM

## 2018-04-16 DIAGNOSIS — S8002XA Contusion of left knee, initial encounter: Secondary | ICD-10-CM

## 2018-04-16 DIAGNOSIS — M1712 Unilateral primary osteoarthritis, left knee: Secondary | ICD-10-CM | POA: Diagnosis not present

## 2018-04-16 DIAGNOSIS — T07XXXA Unspecified multiple injuries, initial encounter: Secondary | ICD-10-CM

## 2018-04-16 NOTE — Discharge Instructions (Signed)
Rest, ice, over the counter pain medication as needed

## 2018-04-16 NOTE — Telephone Encounter (Addendum)
Patient called reporting she has been losing balance and this resulted in a fall. Has had confusion and unstable gait along with some vision changes. Advised MUC or ER since needing seen in PM. Also advised not to drive.   Patient called Frances Harmon and stated her work is instructing her to go to [facility] as this is work related issue.

## 2018-04-16 NOTE — ED Triage Notes (Signed)
Patient in today stating that she was walking into work this morning and felt herself falling forward and couldn't stop. She denies any loss of consciousness.

## 2018-05-27 ENCOUNTER — Encounter: Payer: Self-pay | Admitting: Family Medicine

## 2018-05-27 ENCOUNTER — Ambulatory Visit: Payer: Medicare Other | Admitting: Family Medicine

## 2018-05-27 VITALS — BP 130/70 | HR 68 | Ht 68.0 in | Wt 202.0 lb

## 2018-05-27 DIAGNOSIS — S8002XS Contusion of left knee, sequela: Secondary | ICD-10-CM | POA: Diagnosis not present

## 2018-05-27 DIAGNOSIS — W19XXXS Unspecified fall, sequela: Secondary | ICD-10-CM | POA: Diagnosis not present

## 2018-05-27 DIAGNOSIS — D696 Thrombocytopenia, unspecified: Secondary | ICD-10-CM

## 2018-05-27 NOTE — Progress Notes (Signed)
Date:  05/27/2018   Name:  Frances Harmon   DOB:  1945-11-14   MRN:  [id number]   Chief Complaint: Follow-up (fell 04/16/18- seen in urgent care and checked out good. Since then, has had a fear of falling/ dizziness/ feeling off balance)  Dizziness  This is a new problem. The current episode started 1 to 4 weeks ago. The problem has been waxing and waning. Pertinent negatives include no abdominal pain, anorexia, arthralgias, change in bowel habit, chest pain, chills, congestion, coughing, diaphoresis, fatigue, fever, headaches, joint swelling, myalgias, nausea, neck pain, numbness, rash, sore throat, swollen glands, urinary symptoms, vertigo, visual change, vomiting or weakness. Nothing aggravates the symptoms. The treatment provided moderate relief.     Review of Systems  Constitutional: Negative.  Negative for chills, diaphoresis, fatigue, fever and unexpected weight change.  HENT: Negative for congestion, ear discharge, ear pain, rhinorrhea, sinus pressure, sneezing and sore throat.   Eyes: Negative for photophobia, pain, discharge, redness and itching.  Respiratory: Negative for cough, shortness of breath, wheezing and stridor.   Cardiovascular: Negative for chest pain.  Gastrointestinal: Negative for abdominal pain, anorexia, blood in stool, change in bowel habit, constipation, diarrhea, nausea and vomiting.  Endocrine: Negative for cold intolerance, heat intolerance, polydipsia, polyphagia and polyuria.  Genitourinary: Negative for dysuria, flank pain, frequency, hematuria, menstrual problem, pelvic pain, urgency, vaginal bleeding and vaginal discharge.  Musculoskeletal: Negative for arthralgias, back pain, joint swelling, myalgias and neck pain.  Skin: Negative for rash.  Allergic/Immunologic: Negative for environmental allergies and food allergies.  Neurological: Positive for dizziness. Negative for vertigo, weakness, light-headedness, numbness and headaches.  Hematological:  Negative for adenopathy. Does not bruise/bleed easily.  Psychiatric/Behavioral: Negative for dysphoric mood. The patient is not nervous/anxious.     Patient Active Problem List   Diagnosis Date Noted  . Type 2 diabetes mellitus with complication, without long-term current use of insulin ([place]) 04/28/2015  . Essential hypertension 04/28/2015  . Hyperlipidemia 04/28/2015  . Esophageal reflux 04/28/2015  . Vitamin D deficiency 04/28/2015    No Known Allergies  Past Surgical History:  Procedure Laterality Date  . COLONOSCOPY  2009   repeat in 5 years- Dr Bary Leriche  . COLONOSCOPY WITH PROPOFOL N/A 09/09/2015   Procedure: COLONOSCOPY WITH PROPOFOL;  Surgeon: Hulen Luster, MD;  Location: [facility] ENDOSCOPY;  Service: Gastroenterology;  Laterality: N/A;  . FOOT SURGERY Bilateral    Bunion removal  . INCONTINENCE SURGERY    . VAGINAL HYSTERECTOMY    . VENTRAL HERNIA REPAIR N/A 05/06/2015   Procedure: HERNIA REPAIR VENTRAL ADULT;  Surgeon: Leonie Green, MD;  Location: [facility] ORS;  Service: General;  Laterality: N/A;    Social History   Tobacco Use  . Smoking status: Never Smoker  . Smokeless tobacco: Never Used  . Tobacco comment: smokin cessation materials not required  Substance Use Topics  . Alcohol use: No    Alcohol/week: 0.0 standard drinks  . Drug use: No     Medication list has been reviewed and updated.  Current Meds  Medication Sig  . atorvastatin (LIPITOR) 10 MG tablet TAKE (1) TABLET BY MOUTH EVERY DAY AT 6 IN THE MORNING  . glipiZIDE (GLUCOTROL XL) 2.5 MG 24 hr tablet TAKE ONE TABLET BY MOUTH DAILY AT THE SAME TIME EVERY MORNING  . losartan-hydrochlorothiazide (HYZAAR) 100-25 MG tablet TAKE (1) TABLET BY MOUTH EVERY DAY AT 6 IN THE MORNING  . metFORMIN (GLUCOPHAGE) 500 MG tablet Take 1 tab BID  . omeprazole (PRILOSEC)  20 MG capsule TAKE 1 CAPSULE BY MOUTH EVERY MORNING ATTHE SAME TIME  . terbinafine (LAMISIL) 250 MG tablet Take 1 tablet (250 mg total) by mouth daily.  (Patient taking differently: Take 250 mg by mouth daily. Dr Amalia Hailey)    [facility] 2/9 Scores 05/27/2018 02/26/2018 01/21/2017 07/03/2016  PHQ - 2 Score 0 0 0 0  PHQ- 9 Score 0 0 - -    Physical Exam  Constitutional: She is oriented to person, place, and time. She appears well-developed and well-nourished.  HENT:  Head: Normocephalic.  Right Ear: External ear normal.  Left Ear: External ear normal.  Mouth/Throat: Oropharynx is clear and moist.  Eyes: Pupils are equal, round, and reactive to light. Conjunctivae and EOM are normal. Lids are everted and swept, no foreign bodies found. Left eye exhibits no hordeolum. No foreign body present in the left eye. Right conjunctiva is not injected. Left conjunctiva is not injected. No scleral icterus.  Neck: Normal range of motion. Neck supple. No JVD present. No tracheal deviation present. No thyromegaly present.  Cardiovascular: Normal rate, regular rhythm, normal heart sounds and intact distal pulses. Exam reveals no gallop and no friction rub.  No murmur heard. Pulmonary/Chest: Effort normal and breath sounds normal. No respiratory distress. She has no wheezes. She has no rales.  Abdominal: Soft. Bowel sounds are normal. She exhibits no mass. There is no hepatosplenomegaly. There is no tenderness. There is no rebound and no guarding.  Musculoskeletal: Normal range of motion. She exhibits no edema or tenderness.       Left knee: She exhibits deformity.       Legs: Lymphadenopathy:    She has no cervical adenopathy.  Neurological: She is alert and oriented to person, place, and time. She has normal strength. She displays normal reflexes. No cranial nerve deficit or sensory deficit.  Reflex Scores:      Tricep reflexes are 2+ on the right side and 2+ on the left side.      Bicep reflexes are 2+ on the right side and 2+ on the left side.      Brachioradialis reflexes are 2+ on the right side and 2+ on the left side.      Patellar reflexes are 2+ on the right  side and 2+ on the left side.      Achilles reflexes are 2+ on the right side and 2+ on the left side. Skin: Skin is warm. No rash noted.  Psychiatric: She has a normal mood and affect. Her mood appears not anxious. She does not exhibit a depressed mood.  Nursing note and vitals reviewed.   BP 130/70   Pulse 68   Ht 5\' 8"  (1.727 m)   Wt 202 lb (91.6 kg)   BMI 30.71 kg/m   Assessment and Plan:  1. Fall, sequela Was seen in urgent care/ no concerns  2. Patellar contusion, left, sequela improved  3. Thrombocytopenia ([place]) Draw cbc - CBC with Differential/Platelet  Dr. Otilio Miu [facility]  05/27/2018

## 2018-05-28 LAB — CBC WITH DIFFERENTIAL/PLATELET
BASOS ABS: 0.1 10*3/uL (ref 0.0–0.2)
Basos: 1 %
EOS (ABSOLUTE): 0.1 10*3/uL (ref 0.0–0.4)
Eos: 2 %
HEMATOCRIT: 40.2 % (ref 34.0–46.6)
HEMOGLOBIN: 13.8 g/dL (ref 11.1–15.9)
Immature Grans (Abs): 0 10*3/uL (ref 0.0–0.1)
Immature Granulocytes: 0 %
LYMPHS ABS: 1.7 10*3/uL (ref 0.7–3.1)
Lymphs: 21 %
MCH: 29.2 pg (ref 26.6–33.0)
MCHC: 34.3 g/dL (ref 31.5–35.7)
MCV: 85 fL (ref 79–97)
MONOS ABS: 0.6 10*3/uL (ref 0.1–0.9)
Monocytes: 7 %
NEUTROS ABS: 5.5 10*3/uL (ref 1.4–7.0)
Neutrophils: 69 %
Platelets: 133 10*3/uL — ABNORMAL LOW (ref 150–450)
RBC: 4.72 x10E6/uL (ref 3.77–5.28)
RDW: 12.4 % (ref 12.3–15.4)
WBC: 8 10*3/uL (ref 3.4–10.8)

## 2018-06-09 ENCOUNTER — Other Ambulatory Visit: Payer: Self-pay

## 2018-06-09 ENCOUNTER — Telehealth: Payer: Self-pay

## 2018-06-09 MED ORDER — AZITHROMYCIN 250 MG PO TABS: ORAL_TABLET | ORAL | 0 refills | Status: DC

## 2018-06-09 NOTE — Telephone Encounter (Signed)
Pt called in stating she was in office 2 weeks ago, now has sinus infection with yellow production. Has tried otc med- sent in [place] use as directed to [employer]- need to see if not better

## 2018-07-14 ENCOUNTER — Telehealth: Payer: Self-pay

## 2018-07-14 NOTE — Telephone Encounter (Signed)
Patient needs Metformin refills x 12 mo.

## 2018-07-14 NOTE — Telephone Encounter (Signed)
Pt is diabetic- we sent in a 90 day supply in Sept. I can do another 90 days supply, but she needs to sched an appt for Feb to be seen for med refills/ diab. She doesn't get a years worth of med

## 2018-07-21 NOTE — Telephone Encounter (Signed)
Left message to schedule appt by Feb

## 2018-07-28 ENCOUNTER — Other Ambulatory Visit: Payer: Self-pay | Admitting: Family Medicine

## 2018-07-28 DIAGNOSIS — K219 Gastro-esophageal reflux disease without esophagitis: Secondary | ICD-10-CM

## 2018-08-08 ENCOUNTER — Ambulatory Visit (INDEPENDENT_AMBULATORY_CARE_PROVIDER_SITE_OTHER): Payer: Medicare Other | Admitting: Family Medicine

## 2018-08-08 ENCOUNTER — Encounter: Payer: Self-pay | Admitting: Family Medicine

## 2018-08-08 VITALS — BP 120/80 | HR 68 | Ht 68.0 in | Wt 202.0 lb

## 2018-08-08 DIAGNOSIS — J0100 Acute maxillary sinusitis, unspecified: Secondary | ICD-10-CM

## 2018-08-08 DIAGNOSIS — R059 Cough, unspecified: Secondary | ICD-10-CM

## 2018-08-08 DIAGNOSIS — R05 Cough: Secondary | ICD-10-CM | POA: Diagnosis not present

## 2018-08-08 MED ORDER — GUAIFENESIN-CODEINE 100-10 MG/5ML PO SYRP: 5.0000 mL | ORAL_SOLUTION | Freq: Three times a day (TID) | ORAL | 0 refills | Status: DC | PRN

## 2018-08-08 MED ORDER — AMOXICILLIN 500 MG PO CAPS: 500.0000 mg | ORAL_CAPSULE | Freq: Three times a day (TID) | ORAL | 0 refills | Status: DC

## 2018-08-08 NOTE — Progress Notes (Signed)
Date:  08/08/2018   Name:  Frances Harmon   DOB:  April 04, 1946   MRN:  [id number]   Chief Complaint: Sinusitis (cough and cong- yellow production, eyes are itchy)  Sinusitis  This is a new problem. The current episode started in the past 7 days (Wednesday). The problem has been gradually worsening since onset. There has been no fever. Associated symptoms include congestion, coughing, diaphoresis, headaches, sinus pressure, sneezing and a sore throat. Pertinent negatives include no chills, ear pain, hoarse voice, neck pain or shortness of breath. Past treatments include oral decongestants. The treatment provided mild relief.    Review of Systems  Constitutional: Positive for diaphoresis. Negative for chills, fatigue, fever and unexpected weight change.  HENT: Positive for congestion, sinus pressure, sneezing and sore throat. Negative for ear discharge, ear pain, hoarse voice and rhinorrhea.   Eyes: Negative for photophobia, pain, discharge, redness and itching.  Respiratory: Positive for cough. Negative for shortness of breath, wheezing and stridor.   Gastrointestinal: Negative for abdominal pain, blood in stool, constipation, diarrhea, nausea and vomiting.  Endocrine: Negative for cold intolerance, heat intolerance, polydipsia, polyphagia and polyuria.  Genitourinary: Negative for dysuria, flank pain, frequency, hematuria, menstrual problem, pelvic pain, urgency, vaginal bleeding and vaginal discharge.  Musculoskeletal: Negative for arthralgias, back pain, myalgias and neck pain.  Skin: Negative for rash.  Allergic/Immunologic: Negative for environmental allergies and food allergies.  Neurological: Positive for headaches. Negative for dizziness, weakness, light-headedness and numbness.  Hematological: Negative for adenopathy. Does not bruise/bleed easily.  Psychiatric/Behavioral: Negative for dysphoric mood. The patient is not nervous/anxious.     Patient Active Problem List   Diagnosis Date Noted  . Type 2 diabetes mellitus with complication, without long-term current use of insulin ([place]) 04/28/2015  . Essential hypertension 04/28/2015  . Hyperlipidemia 04/28/2015  . Esophageal reflux 04/28/2015  . Vitamin D deficiency 04/28/2015    No Known Allergies  Past Surgical History:  Procedure Laterality Date  . COLONOSCOPY  2009   repeat in 5 years- Dr Bary Leriche  . COLONOSCOPY WITH PROPOFOL N/A 09/09/2015   Procedure: COLONOSCOPY WITH PROPOFOL;  Surgeon: Hulen Luster, MD;  Location: [facility] ENDOSCOPY;  Service: Gastroenterology;  Laterality: N/A;  . FOOT SURGERY Bilateral    Bunion removal  . INCONTINENCE SURGERY    . VAGINAL HYSTERECTOMY    . VENTRAL HERNIA REPAIR N/A 05/06/2015   Procedure: HERNIA REPAIR VENTRAL ADULT;  Surgeon: Leonie Green, MD;  Location: [facility] ORS;  Service: General;  Laterality: N/A;    Social History   Tobacco Use  . Smoking status: Never Smoker  . Smokeless tobacco: Never Used  . Tobacco comment: smokin cessation materials not required  Substance Use Topics  . Alcohol use: No    Alcohol/week: 0.0 standard drinks  . Drug use: No     Medication list has been reviewed and updated.  Current Meds  Medication Sig  . atorvastatin (LIPITOR) 10 MG tablet TAKE (1) TABLET BY MOUTH EVERY DAY AT 6 IN THE MORNING  . glipiZIDE (GLUCOTROL XL) 2.5 MG 24 hr tablet TAKE ONE TABLET BY MOUTH DAILY AT THE SAME TIME EVERY MORNING  . losartan-hydrochlorothiazide (HYZAAR) 100-25 MG tablet TAKE (1) TABLET BY MOUTH EVERY DAY AT 6 IN THE MORNING  . metFORMIN (GLUCOPHAGE) 500 MG tablet Take 1 tab BID  . omeprazole (PRILOSEC) 20 MG capsule TAKE ONE CAPSULE EACH MORNING AT THE SAME TIME  . terbinafine (LAMISIL) 250 MG tablet Take 1 tablet (250 mg total) by mouth  daily. (Patient taking differently: Take 250 mg by mouth daily. Dr Amalia Hailey)    [facility] 2/9 Scores 05/27/2018 02/26/2018 01/21/2017 07/03/2016  PHQ - 2 Score 0 0 0 0  PHQ- 9 Score 0 0 - -    Physical  Exam Vitals signs and nursing note reviewed.  Constitutional:      General: She is not in acute distress.    Appearance: She is not diaphoretic.  HENT:     Head: Normocephalic and atraumatic.     Right Ear: Tympanic membrane and external ear normal.     Left Ear: Tympanic membrane and external ear normal.     Nose:     Right Sinus: Maxillary sinus tenderness present.     Left Sinus: Maxillary sinus tenderness present.  Eyes:     General:        Right eye: No discharge.        Left eye: No discharge.     Conjunctiva/sclera: Conjunctivae normal.     Pupils: Pupils are equal, round, and reactive to light.  Neck:     Musculoskeletal: Normal range of motion and neck supple.     Thyroid: No thyromegaly.     Vascular: No JVD.  Cardiovascular:     Rate and Rhythm: Normal rate and regular rhythm.     Heart sounds: Normal heart sounds. No murmur. No friction rub. No gallop.   Pulmonary:     Effort: Pulmonary effort is normal.     Breath sounds: Normal breath sounds.  Abdominal:     General: Bowel sounds are normal.     Palpations: Abdomen is soft. There is no mass.     Tenderness: There is no abdominal tenderness. There is no guarding.  Musculoskeletal: Normal range of motion.  Lymphadenopathy:     Head:     Right side of head: Submandibular adenopathy present.     Left side of head: Submandibular adenopathy present.     Cervical: No cervical adenopathy.  Skin:    General: Skin is warm and dry.  Neurological:     General: No focal deficit present.     Mental Status: She is alert.     Deep Tendon Reflexes: Reflexes are normal and symmetric.     BP 120/80   Pulse 68   Ht 5\' 8"  (1.727 m)   Wt 202 lb (91.6 kg)   BMI 30.71 kg/m   Assessment and Plan: 1. Acute maxillary sinusitis, recurrence not specified Acute.  Persistent.  Initiate amoxicillin 500 mg 3 times a day.  Check as needed. - amoxicillin (AMOXIL) 500 MG capsule; Take 1 capsule (500 mg total) by mouth 3 (three)  times daily.  Dispense: 30 capsule; Refill: 0  2. Cough And has persistent cough and will control with Robitussin-AC a teaspoon every 6 hours as needed. - guaiFENesin-codeine (ROBITUSSIN AC) 100-10 MG/5ML syrup; Take 5 mLs by mouth 3 (three) times daily as needed for cough.  Dispense: 100 mL; Refill: 0

## 2018-09-22 DIAGNOSIS — I10 Essential (primary) hypertension: Secondary | ICD-10-CM | POA: Diagnosis not present

## 2018-09-22 DIAGNOSIS — E782 Mixed hyperlipidemia: Secondary | ICD-10-CM | POA: Diagnosis not present

## 2018-09-22 DIAGNOSIS — E119 Type 2 diabetes mellitus without complications: Secondary | ICD-10-CM | POA: Diagnosis not present

## 2018-09-30 LAB — HEMOGLOBIN A1C: Hemoglobin A1C: 5.6

## 2018-10-17 ENCOUNTER — Ambulatory Visit (INDEPENDENT_AMBULATORY_CARE_PROVIDER_SITE_OTHER): Payer: Medicare Other | Admitting: Family Medicine

## 2018-10-17 ENCOUNTER — Other Ambulatory Visit: Payer: Self-pay

## 2018-10-17 ENCOUNTER — Encounter: Payer: Self-pay | Admitting: Family Medicine

## 2018-10-17 DIAGNOSIS — I10 Essential (primary) hypertension: Secondary | ICD-10-CM

## 2018-10-17 DIAGNOSIS — K219 Gastro-esophageal reflux disease without esophagitis: Secondary | ICD-10-CM

## 2018-10-17 DIAGNOSIS — E785 Hyperlipidemia, unspecified: Secondary | ICD-10-CM

## 2018-10-17 DIAGNOSIS — E119 Type 2 diabetes mellitus without complications: Secondary | ICD-10-CM

## 2018-10-17 MED ORDER — ATORVASTATIN CALCIUM 10 MG PO TABS: ORAL_TABLET | ORAL | 1 refills | Status: DC

## 2018-10-17 MED ORDER — METFORMIN HCL 500 MG PO TABS: ORAL_TABLET | ORAL | 1 refills | Status: DC

## 2018-10-17 MED ORDER — OMEPRAZOLE 20 MG PO CPDR: DELAYED_RELEASE_CAPSULE | ORAL | 1 refills | Status: DC

## 2018-10-17 MED ORDER — GLIPIZIDE ER 2.5 MG PO TB24: ORAL_TABLET | ORAL | 1 refills | Status: DC

## 2018-10-17 MED ORDER — LOSARTAN POTASSIUM-HCTZ 100-25 MG PO TABS: ORAL_TABLET | ORAL | 1 refills | Status: DC

## 2018-10-17 NOTE — Progress Notes (Signed)
Date:  10/17/2018   Name:  Frances Harmon   DOB:  03-22-1946   MRN:  [id number]   Chief Complaint: Diabetes (needs foot exam); Hypertension; Hyperlipidemia; and Gastroesophageal Reflux  Diabetes  She presents for her follow-up diabetic visit. She has type 2 diabetes mellitus. Her disease course has been stable. There are no hypoglycemic associated symptoms. Pertinent negatives for hypoglycemia include no dizziness, headaches, nervousness/anxiousness or sweats. There are no diabetic associated symptoms. Pertinent negatives for diabetes include no blurred vision, no chest pain, no fatigue, no foot paresthesias, no foot ulcerations, no polydipsia, no polyphagia, no polyuria, no visual change, no weakness and no weight loss. There are no hypoglycemic complications. Symptoms are stable. There are no diabetic complications. Pertinent negatives for diabetic complications include no CVA or PVD. Risk factors for coronary artery disease include dyslipidemia and diabetes mellitus. Current diabetic treatment includes oral agent (dual therapy). She is compliant with treatment all of the time. She is following a generally healthy diet. Meal planning includes avoidance of concentrated sweets and carbohydrate counting. She participates in exercise intermittently. Her home blood glucose trend is fluctuating minimally. An ACE inhibitor/angiotensin II receptor blocker is being taken. She does not see a podiatrist.Eye exam is not current.  Hypertension  This is a chronic problem. The current episode started more than 1 year ago. The problem has been gradually worsening since onset. The problem is controlled. Pertinent negatives include no anxiety, blurred vision, chest pain, headaches, malaise/fatigue, neck pain, orthopnea, palpitations, peripheral edema, PND, shortness of breath or sweats. There are no associated agents to hypertension. There are no known risk factors for coronary artery disease. Past treatments  include angiotensin blockers and diuretics. The current treatment provides moderate improvement. There are no compliance problems.  There is no history of angina, kidney disease, CAD/MI, CVA, heart failure, left ventricular hypertrophy or PVD. There is no history of chronic renal disease or a hypertension causing med.  Hyperlipidemia  This is a chronic problem. The current episode started more than 1 year ago. The problem is controlled. Recent lipid tests were reviewed and are normal. Exacerbating diseases include diabetes and obesity. She has no history of chronic renal disease, hypothyroidism, liver disease or nephrotic syndrome. Factors aggravating her hyperlipidemia include thiazides. Pertinent negatives include no chest pain, focal sensory loss, focal weakness, leg pain, myalgias or shortness of breath. Current antihyperlipidemic treatment includes statins. The current treatment provides moderate improvement of lipids. There are no compliance problems.  There are no known risk factors for coronary artery disease.  Gastroesophageal Reflux  She complains of heartburn. She reports no abdominal pain, no belching, no chest pain, no choking, no coughing, no dysphagia, no early satiety, no globus sensation, no hoarse voice, no nausea, no sore throat, no stridor, no water brash or no wheezing. This is a chronic problem. Pertinent negatives include no fatigue or weight loss. She has tried a PPI for the symptoms. The treatment provided moderate relief.    Review of Systems  Constitutional: Negative.  Negative for chills, fatigue, fever, malaise/fatigue, unexpected weight change and weight loss.  HENT: Negative for congestion, ear discharge, ear pain, hoarse voice, rhinorrhea, sinus pressure, sneezing and sore throat.   Eyes: Negative for blurred vision, photophobia, pain, discharge, redness and itching.  Respiratory: Negative for cough, choking, shortness of breath, wheezing and stridor.   Cardiovascular:  Negative for chest pain, palpitations, orthopnea and PND.  Gastrointestinal: Positive for heartburn. Negative for abdominal pain, blood in stool, constipation, diarrhea, dysphagia, nausea  and vomiting.  Endocrine: Negative for cold intolerance, heat intolerance, polydipsia, polyphagia and polyuria.  Genitourinary: Negative for dysuria, flank pain, frequency, hematuria, menstrual problem, pelvic pain, urgency, vaginal bleeding and vaginal discharge.  Musculoskeletal: Negative for arthralgias, back pain, myalgias and neck pain.  Skin: Negative for rash.  Allergic/Immunologic: Negative for environmental allergies and food allergies.  Neurological: Negative for dizziness, focal weakness, weakness, light-headedness, numbness and headaches.  Hematological: Negative for adenopathy. Does not bruise/bleed easily.  Psychiatric/Behavioral: Negative for dysphoric mood. The patient is not nervous/anxious.     Patient Active Problem List   Diagnosis Date Noted  . Type 2 diabetes mellitus with complication, without long-term current use of insulin ([place]) 04/28/2015  . Essential hypertension 04/28/2015  . Hyperlipidemia 04/28/2015  . Esophageal reflux 04/28/2015  . Vitamin D deficiency 04/28/2015    No Known Allergies  Past Surgical History:  Procedure Laterality Date  . COLONOSCOPY  2009   repeat in 5 years- Dr Bary Leriche  . COLONOSCOPY WITH PROPOFOL N/A 09/09/2015   Procedure: COLONOSCOPY WITH PROPOFOL;  Surgeon: Hulen Luster, MD;  Location: [facility] ENDOSCOPY;  Service: Gastroenterology;  Laterality: N/A;  . FOOT SURGERY Bilateral    Bunion removal  . INCONTINENCE SURGERY    . VAGINAL HYSTERECTOMY    . VENTRAL HERNIA REPAIR N/A 05/06/2015   Procedure: HERNIA REPAIR VENTRAL ADULT;  Surgeon: Leonie Green, MD;  Location: [facility] ORS;  Service: General;  Laterality: N/A;    Social History   Tobacco Use  . Smoking status: Never Smoker  . Smokeless tobacco: Never Used  . Tobacco comment: smokin  cessation materials not required  Substance Use Topics  . Alcohol use: No    Alcohol/week: 0.0 standard drinks  . Drug use: No     Medication list has been reviewed and updated.  Current Meds  Medication Sig  . atorvastatin (LIPITOR) 10 MG tablet TAKE (1) TABLET BY MOUTH EVERY DAY AT 6 IN THE MORNING  . glipiZIDE (GLUCOTROL XL) 2.5 MG 24 hr tablet TAKE ONE TABLET BY MOUTH DAILY AT THE SAME TIME EVERY MORNING  . losartan-hydrochlorothiazide (HYZAAR) 100-25 MG tablet TAKE (1) TABLET BY MOUTH EVERY DAY AT 6 IN THE MORNING  . metFORMIN (GLUCOPHAGE) 500 MG tablet Take 1 tab BID  . omeprazole (PRILOSEC) 20 MG capsule TAKE ONE CAPSULE EACH MORNING AT THE SAME TIME    PHQ 2/9 Scores 10/17/2018 05/27/2018 02/26/2018 01/21/2017  PHQ - 2 Score 0 0 0 0  PHQ- 9 Score 0 0 0 -    BP Readings from Last 3 Encounters:  10/17/18 132/62  08/08/18 120/80  05/27/18 130/70    Physical Exam Vitals signs and nursing note reviewed.  Constitutional:      General: She is not in acute distress.    Appearance: She is not diaphoretic.  HENT:     Head: Normocephalic and atraumatic.     Right Ear: External ear normal.     Left Ear: External ear normal.     Nose: Nose normal.  Eyes:     General:        Right eye: No discharge.        Left eye: No discharge.     Conjunctiva/sclera: Conjunctivae normal.     Pupils: Pupils are equal, round, and reactive to light.  Neck:     Musculoskeletal: Normal range of motion and neck supple.     Thyroid: No thyromegaly.     Vascular: No JVD.  Cardiovascular:     Rate  and Rhythm: Normal rate and regular rhythm.     Heart sounds: Normal heart sounds, S1 normal and S2 normal. No murmur. No systolic murmur. No diastolic murmur. No friction rub. No gallop. No S3 or S4 sounds.   Pulmonary:     Effort: Pulmonary effort is normal.     Breath sounds: Normal breath sounds.  Abdominal:     General: Bowel sounds are normal.     Palpations: Abdomen is soft. There is no mass.      Tenderness: There is no abdominal tenderness. There is no guarding.  Musculoskeletal: Normal range of motion.     Right foot: Normal range of motion. No deformity.     Left foot: Normal range of motion. No deformity.  Feet:     Right foot:     Protective Sensation: 10 sites tested. 10 sites sensed.     Skin integrity: Skin integrity normal. No ulcer, blister, skin breakdown, erythema, warmth or dry skin.     Toenail Condition: Right toenails are abnormally thick.     Left foot:     Protective Sensation: 10 sites tested. 10 sites sensed.     Skin integrity: Skin integrity normal. No ulcer, blister, skin breakdown, erythema, warmth or dry skin.     Toenail Condition: Left toenails are abnormally thick.  Lymphadenopathy:     Cervical: No cervical adenopathy.  Skin:    General: Skin is warm and dry.  Neurological:     Mental Status: She is alert.     Deep Tendon Reflexes: Reflexes are normal and symmetric.     Wt Readings from Last 3 Encounters:  10/17/18 202 lb (91.6 kg)  08/08/18 202 lb (91.6 kg)  05/27/18 202 lb (91.6 kg)    BP 132/62   Pulse 72   Ht 5\' 8"  (1.727 m)   Wt 202 lb (91.6 kg)   BMI 30.71 kg/m   Assessment and Plan:  1. Hyperlipidemia, unspecified hyperlipidemia type Chronic.  Controlled.  Continue atorvastatin 10 mg once a day.  Will check lipid panel. - Lipid Panel With LDL/HDL Ratio - atorvastatin (LIPITOR) 10 MG tablet; TAKE (1) TABLET BY MOUTH EVERY DAY AT 6 IN THE MORNING  Dispense: 90 tablet; Refill: 1  2. Type 2 diabetes mellitus without complication, without long-term current use of insulin ([facility]) Chronic.  Controlled.  Continue glipizide 2.5 mg 1 a day and metformin 500 mg twice a day will check microalbuminuria today. - Microalbumin, urine - glipiZIDE (GLUCOTROL XL) 2.5 MG 24 hr tablet; TAKE ONE TABLET BY MOUTH DAILY AT THE SAME TIME EVERY MORNING  Dispense: 90 tablet; Refill: 1 - metFORMIN (GLUCOPHAGE) 500 MG tablet; Take 1 tab BID  Dispense:  180 tablet; Refill: 1  3. Essential hypertension Chronic.  Controlled.  Continue losartan hydrochlorothiazide 100-25 once a day and check renal function panel. - Renal Function Panel - losartan-hydrochlorothiazide (HYZAAR) 100-25 MG tablet; TAKE (1) TABLET BY MOUTH EVERY DAY AT 6 IN THE MORNING  Dispense: 90 tablet; Refill: 1  4. Gastroesophageal reflux disease, esophagitis presence not specified Chronic.  Controlled.  Continue omeprazole 20 mg once a day. - omeprazole (PRILOSEC) 20 MG capsule; TAKE ONE CAPSULE EACH MORNING AT THE SAME TIME  Dispense: 90 capsule; Refill: 1

## 2018-10-18 LAB — LIPID PANEL WITH LDL/HDL RATIO
CHOLESTEROL TOTAL: 154 mg/dL (ref 100–199)
HDL: 52 mg/dL (ref >39)
LDL Calculated: 84 mg/dL (ref 0–99)
LDl/HDL Ratio: 1.6 ratio (ref 0.0–3.2)
TRIGLYCERIDES: 92 mg/dL (ref 0–149)
VLDL Cholesterol Cal: 18 mg/dL (ref 5–40)

## 2018-10-18 LAB — RENAL FUNCTION PANEL
Albumin: 4.2 g/dL (ref 3.7–4.7)
BUN / CREAT RATIO: 14 (ref 12–28)
BUN: 10 mg/dL (ref 8–27)
CALCIUM: 9.5 mg/dL (ref 8.7–10.3)
CHLORIDE: 103 mmol/L (ref 96–106)
CO2: 25 mmol/L (ref 20–29)
Creatinine, Ser: 0.71 mg/dL (ref 0.57–1.00)
GFR calc Af Amer: 99 mL/min/{1.73_m2} (ref >59)
GFR calc non Af Amer: 86 mL/min/{1.73_m2} (ref >59)
Glucose: 111 mg/dL — ABNORMAL HIGH (ref 65–99)
Phosphorus: 3.8 mg/dL (ref 3.0–4.3)
Potassium: 4.2 mmol/L (ref 3.5–5.2)
SODIUM: 144 mmol/L (ref 134–144)

## 2018-10-18 LAB — MICROALBUMIN, URINE

## 2018-12-12 ENCOUNTER — Other Ambulatory Visit: Payer: Self-pay | Admitting: Family Medicine

## 2018-12-12 DIAGNOSIS — Z1231 Encounter for screening mammogram for malignant neoplasm of breast: Secondary | ICD-10-CM

## 2019-01-06 ENCOUNTER — Other Ambulatory Visit: Payer: Self-pay

## 2019-01-06 ENCOUNTER — Ambulatory Visit
Admission: RE | Admit: 2019-01-06 | Discharge: 2019-01-06 | Disposition: A | Discharge status: Home / Self Care | Payer: Medicare Other | Source: Ambulatory Visit | Attending: Family Medicine | Admitting: Family Medicine

## 2019-01-06 ENCOUNTER — Encounter (INDEPENDENT_AMBULATORY_CARE_PROVIDER_SITE_OTHER): Payer: Self-pay

## 2019-01-06 DIAGNOSIS — Z1231 Encounter for screening mammogram for malignant neoplasm of breast: Secondary | ICD-10-CM | POA: Diagnosis not present

## 2019-01-30 ENCOUNTER — Other Ambulatory Visit: Payer: Self-pay | Admitting: Family Medicine

## 2019-01-30 DIAGNOSIS — E785 Hyperlipidemia, unspecified: Secondary | ICD-10-CM

## 2019-03-02 ENCOUNTER — Ambulatory Visit (INDEPENDENT_AMBULATORY_CARE_PROVIDER_SITE_OTHER): Payer: Medicare Other

## 2019-03-02 ENCOUNTER — Other Ambulatory Visit: Payer: Self-pay

## 2019-03-02 VITALS — BP 142/84 | HR 65 | Temp 98.1°F | Resp 16 | Ht 68.0 in | Wt 206.8 lb

## 2019-03-02 DIAGNOSIS — Z0000 Encounter for general adult medical examination without abnormal findings: Secondary | ICD-10-CM

## 2019-03-02 NOTE — Progress Notes (Signed)
Subjective:   Frances Harmon is a 73 y.o. female who presents for Medicare Annual (Subsequent) preventive examination.  Review of Systems:   Cardiac Risk Factors include: advanced age (>81men, >45 women);diabetes mellitus;hypertension;dyslipidemia;obesity (BMI >30kg/m2)     Objective:     Vitals: BP (!) 142/84 (BP Location: Left Arm, Patient Position: Sitting, Cuff Size: Normal)   Pulse 65   Temp 98.1 F (36.7 C) (Oral)   Resp 16   Ht 5\' 8"  (1.727 m)   Wt 206 lb 12.8 oz (93.8 kg)   BMI 31.44 kg/m   Body mass index is 31.44 kg/m.  Advanced Directives 03/02/2019 02/26/2018 01/21/2017 05/06/2015 04/28/2015 03/07/2015  Does Patient Have a Medical Advance Directive? No Yes Yes Yes Yes Yes  Type of Advance Directive - [place];Living will [place];Living will [place];Living will Living will Living will  Does patient want to make changes to medical advance directive? - - - No - Patient declined No - Patient declined -  Copy of [place] in Chart? - No - copy requested No - copy requested No - copy requested - No - copy requested  Would patient like information on creating a medical advance directive? Yes (MAU/Ambulatory/Procedural Areas - Information given) - - - - -    Tobacco Social History   Tobacco Use  Smoking Status Never Smoker  Smokeless Tobacco Never Used  Tobacco Comment   smokin cessation materials not required     Counseling given: Not Answered Comment: smokin cessation materials not required   Clinical Intake:  Pre-visit preparation completed: Yes  Pain : No/denies pain     BMI - recorded: 31.44 Nutritional Status: BMI > 30  Obese Nutritional Risks: None Diabetes: Yes CBG done?: No Did pt. bring in CBG monitor from home?: No  How often do you need to have someone help you when you read instructions, pamphlets, or other written materials from your doctor or pharmacy?: 1 -  Never  Interpreter Needed?: No  Information entered by :: Clemetine Marker LPN  Past Medical History:  Diagnosis Date  . Diabetes mellitus without complication ([place])   . GERD (gastroesophageal reflux disease)   . Hyperlipidemia   . Hypertension    Past Surgical History:  Procedure Laterality Date  . COLONOSCOPY  2009   repeat in 5 years- Dr Bary Leriche  . COLONOSCOPY WITH PROPOFOL N/A 09/09/2015   Procedure: COLONOSCOPY WITH PROPOFOL;  Surgeon: Hulen Luster, MD;  Location: [facility] ENDOSCOPY;  Service: Gastroenterology;  Laterality: N/A;  . FOOT SURGERY Bilateral    Bunion removal  . INCONTINENCE SURGERY    . VAGINAL HYSTERECTOMY    . VENTRAL HERNIA REPAIR N/A 05/06/2015   Procedure: HERNIA REPAIR VENTRAL ADULT;  Surgeon: Leonie Green, MD;  Location: [facility] ORS;  Service: General;  Laterality: N/A;   Family History  Problem Relation Age of Onset  . Breast cancer Daughter 70  . Breast cancer Mother 59  . Hypertension Brother   . Diabetes Brother   . Arthritis Brother    Social History   Socioeconomic History  . Marital status: Married    Spouse name: Not on file  . Number of children: 2  . Years of education: Not on file  . Highest education level: 12th grade  Occupational History  . 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 Financial resource strain: Not hard at all  . Food insecurity  Worry: Never true    Inability: Never true  . Transportation needs    Medical: No    Non-medical: No  Tobacco Use  . Smoking status: Never Smoker  . Smokeless tobacco: Never Used  . Tobacco comment: smokin cessation materials not required  Substance and Sexual Activity  . Alcohol use: No    Alcohol/week: 0.0 standard drinks  . Drug use: No  . Sexual activity: Never  Lifestyle  . Physical activity    Days per week: 2 days    Minutes per session: 20 min  . Stress: Not at all  Relationships  . Social Herbalist on phone: Patient refused    Gets  together: Patient refused    Attends religious service: Patient refused    Active member of club or organization: Patient refused    Attends meetings of clubs or organizations: Patient refused    Relationship status: Married  Other Topics Concern  . Not on file  Social History Narrative  . Not on file    Outpatient Encounter Medications as of 03/02/2019  Medication Sig  . atorvastatin (LIPITOR) 10 MG tablet TAKE (1) TABLET BY MOUTH EVERY DAY AT 6 IN THE MORNING  . glipiZIDE (GLUCOTROL XL) 2.5 MG 24 hr tablet TAKE ONE TABLET BY MOUTH DAILY AT THE SAME TIME EVERY MORNING  . losartan-hydrochlorothiazide (HYZAAR) 100-25 MG tablet TAKE (1) TABLET BY MOUTH EVERY DAY AT 6 IN THE MORNING  . metFORMIN (GLUCOPHAGE) 500 MG tablet Take 1 tab BID  . omeprazole (PRILOSEC) 20 MG capsule TAKE ONE CAPSULE EACH MORNING AT THE SAME TIME   No facility-administered encounter medications on file as of 03/02/2019.     Activities of Daily Living In your present state of health, do you have any difficulty performing the following activities: 03/02/2019  Hearing? N  Comment declines hearing aids  Vision? N  Difficulty concentrating or making decisions? N  Walking or climbing stairs? N  Dressing or bathing? N  Doing errands, shopping? N  Preparing Food and eating ? N  Using the Toilet? N  In the past six months, have you accidently leaked urine? N  Do you have problems with loss of bowel control? N  Managing your Medications? N  Managing your Finances? N  Housekeeping or managing your Housekeeping? N  Some recent data might be hidden    Patient Care Team: Juline Patch, MD as PCP - General (Family Medicine)    Assessment:   This is a routine wellness examination for Frances Harmon.  Exercise Activities and Dietary recommendations Current Exercise Habits: Home exercise routine, Type of exercise: walking, Time (Minutes): 20, Frequency (Times/Week): 2, Weekly Exercise (Minutes/Week): 40, Intensity: Mild,  Exercise limited by: None identified  Goals    . DIET - INCREASE WATER INTAKE     Recommend to drink at least 6-8 8oz glasses of water per day.    . Weight (lb) < 200 lb (90.7 kg)     Pt states she would like to lose 10-15 lbs over the next year.        Fall Risk Fall Risk  03/02/2019 05/27/2018 02/26/2018 01/21/2017 07/03/2016  Falls in the past year? 1 1 No Yes No  Number falls in past yr: 1 0 - 1 -  Comment - - - fell backwards at work -  Injury with Fall? 0 0 - Yes -  Comment - - - bruised -  Risk for fall due to : - - Impaired vision - -  Risk for fall due to: Comment - - wears eyeglasses - -  Follow up Falls prevention discussed Falls evaluation completed - Falls prevention discussed -   FALL RISK PREVENTION PERTAINING TO THE HOME:  Any stairs in or around the home? No  If so, do they handrails? No   Home free of loose throw rugs in walkways, pet beds, electrical cords, etc? Yes  Adequate lighting in your home to reduce risk of falls? Yes   ASSISTIVE DEVICES UTILIZED TO PREVENT FALLS:  Life alert? No  Use of a cane, walker or w/c? No  Grab bars in the bathroom? Yes  Shower chair or bench in shower? Yes  Elevated toilet seat or a handicapped toilet? Yes   DME ORDERS:  DME order needed?  No   TIMED UP AND GO:  Was the test performed? Yes .  Length of time to ambulate 10 feet: 5 sec.   GAIT:  Appearance of gait: Gait stead-fast and without the use of an assistive device.   Education: Fall risk prevention has been discussed.  Intervention(s) required? No    Depression Screen PHQ 2/9 Scores 03/02/2019 10/17/2018 05/27/2018 02/26/2018  PHQ - 2 Score 0 0 0 0  PHQ- 9 Score - 0 0 0     Cognitive Function     6CIT Screen 03/02/2019 02/26/2018 01/21/2017  What Year? 0 points 0 points 0 points  What month? 0 points 0 points 0 points  What time? 0 points 0 points 0 points  Count back from 20 0 points 0 points 0 points  Months in reverse 2 points 0 points 0 points   Repeat phrase 0 points 0 points 0 points  Total Score 2 0 0    Immunization History  Administered Date(s) Administered  . Influenza, High Dose Seasonal PF 03/19/2017, 04/09/2018  . Influenza,inj,Quad PF,6+ Mos 04/28/2015    Qualifies for Shingles Vaccine? Yes . Due for Shingrix. Education has been provided regarding the importance of this vaccine. Pt has been advised to call insurance company to determine out of pocket expense. Advised may also receive vaccine at local pharmacy or Health Dept. Verbalized acceptance and understanding.  Tdap: Up to date  Flu Vaccine: Up to date  Pneumococcal Vaccine: Up to date   Screening Tests Health Maintenance  Topic Date Due  . FOOT EXAM  08/15/2018  . OPHTHALMOLOGY EXAM  10/08/2018  . INFLUENZA VACCINE  02/21/2019  . HEMOGLOBIN A1C  04/02/2019  . MAMMOGRAM  01/06/2020  . COLONOSCOPY  09/08/2025  . TETANUS/TDAP  12/05/2026  . DEXA SCAN  Completed  . Hepatitis C Screening  Completed  . PNA vac Low Risk Adult  Completed    Cancer Screenings:  Colorectal Screening: Completed 09/09/15. Repeat every 10 years;   Mammogram: Completed 01/06/19. Repeat every year.  Bone Density: Completed 03/10/18. Results reflect  OSTEOPENIA. Repeat every 2 years.   Lung Cancer Screening: (Low Dose CT Chest recommended if Age 36-80 years, 30 pack-year currently smoking OR have quit w/in 15years.) does not qualify.   Additional Screening:  Hepatitis C Screening: does qualify; Completed 01/21/17  Vision Screening: Recommended annual ophthalmology exams for early detection of glaucoma and other disorders of the eye. Is the patient up to date with their annual eye exam?  No  - appt rescheduled due to Covid-19  Who is the provider or what is the name of the office in which the pt attends annual eye exams? Dr. Ellin Mayhew  Dental Screening: Recommended annual dental exams for  proper oral hygiene  Community Resource Referral:  CRR required this visit?  No       Plan:     I have personally reviewed and addressed the Medicare Annual Wellness questionnaire and have noted the following in the patient's chart:  A. Medical and social history B. Use of alcohol, tobacco or illicit drugs  C. Current medications and supplements D. Functional ability and status E.  Nutritional status F.  Physical activity G. Advance directives H. List of other physicians I.  Hospitalizations, surgeries, and ER visits in previous 12 months J.  [place] such as hearing and vision if needed, cognitive and depression L. Referrals and appointments   In addition, I have reviewed and discussed with patient certain preventive protocols, quality metrics, and best practice recommendations. A written personalized care plan for preventive services as well as general preventive health recommendations were provided to patient.   Signed,  Clemetine Marker, LPN Nurse Health Advisor   Nurse Notes: pt doing well and appreciative of visit today

## 2019-03-02 NOTE — Patient Instructions (Signed)
Frances Harmon , Thank you for taking time to come for your Medicare Wellness Visit. I appreciate your ongoing commitment to your health goals. Please review the following plan we discussed and let me know if I can assist you in the future.   Screening recommendations/referrals: Colonoscopy: done 09/09/15 Mammogram: done 01/06/19 Bone Density: done 03/10/18 Recommended yearly ophthalmology/optometry visit for glaucoma screening and checkup Recommended yearly dental visit for hygiene and checkup  Vaccinations: Influenza vaccine: done 04/28/18 Pneumococcal vaccine: done 06/22/16 Tdap vaccine: done 12/04/16 Shingles vaccine: Shingrix discussed. Please contact your pharmacy for coverage information.   Advanced directives: Advance directive discussed with you today. I have provided a copy for you to complete at home and have notarized. Once this is complete please bring a copy in to our office so we can scan it into your chart.  Conditions/risks identified: Recommend healthy eating and physical activity for desired weight loss  Next appointment: Please follow up in one year for your Medicare Annual Wellness visit.     Preventive Care 18 Years and Older, Female Preventive care refers to lifestyle choices and visits with your health care provider that can promote health and wellness. What does preventive care include?  A yearly physical exam. This is also called an annual well check.  Dental exams once or twice a year.  Routine eye exams. Ask your health care provider how often you should have your eyes checked.  Personal lifestyle choices, including:  Daily care of your teeth and gums.  Regular physical activity.  Eating a healthy diet.  Avoiding tobacco and drug use.  Limiting alcohol use.  Practicing safe sex.  Taking low-dose aspirin every day.  Taking vitamin and mineral supplements as recommended by your health care provider. What happens during an annual well check? The  services and screenings done by your health care provider during your annual well check will depend on your age, overall health, lifestyle risk factors, and family history of disease. Counseling  Your health care provider may ask you questions about your:  Alcohol use.  Tobacco use.  Drug use.  Emotional well-being.  Home and relationship well-being.  Sexual activity.  Eating habits.  History of falls.  Memory and ability to understand (cognition).  Work and work Statistician.  Reproductive health. Screening  You may have the following tests or measurements:  Height, weight, and BMI.  Blood pressure.  Lipid and cholesterol levels. These may be checked every 5 years, or more frequently if you are over 61 years old.  Skin check.  Lung cancer screening. You may have this screening every year starting at age 59 if you have a 30-pack-year history of smoking and currently smoke or have quit within the past 15 years.  Fecal occult blood test (FOBT) of the stool. You may have this test every year starting at age 39.  Flexible sigmoidoscopy or colonoscopy. You may have a sigmoidoscopy every 5 years or a colonoscopy every 10 years starting at age 27.  Hepatitis C blood test.  Hepatitis B blood test.  Sexually transmitted disease (STD) testing.  Diabetes screening. This is done by checking your blood sugar (glucose) after you have not eaten for a while (fasting). You may have this done every 1-3 years.  Bone density scan. This is done to screen for osteoporosis. You may have this done starting at age 61.  Mammogram. This may be done every 1-2 years. Talk to your health care provider about how often you should have regular mammograms. Talk  with your health care provider about your test results, treatment options, and if necessary, the need for more tests. Vaccines  Your health care provider may recommend certain vaccines, such as:  Influenza vaccine. This is recommended  every year.  Tetanus, diphtheria, and acellular pertussis (Tdap, Td) vaccine. You may need a Td booster every 10 years.  Zoster vaccine. You may need this after age 11.  Pneumococcal 13-valent conjugate (PCV13) vaccine. One dose is recommended after age 34.  Pneumococcal polysaccharide (PPSV23) vaccine. One dose is recommended after age 83. Talk to your health care provider about which screenings and vaccines you need and how often you need them. This information is not intended to replace advice given to you by your health care provider. Make sure you discuss any questions you have with your health care provider. Document Released: 08/05/2015 Document Revised: 03/28/2016 Document Reviewed: 05/10/2015 Elsevier Interactive Patient Education  2017 [place] Prevention in the Home Falls can cause injuries. They can happen to people of all ages. There are many things you can do to make your home safe and to help prevent falls. What can I do on the outside of my home?  Regularly fix the edges of walkways and driveways and fix any cracks.  Remove anything that might make you trip as you walk through a door, such as a raised step or threshold.  Trim any bushes or trees on the path to your home.  Use bright outdoor lighting.  Clear any walking paths of anything that might make someone trip, such as rocks or tools.  Regularly check to see if handrails are loose or broken. Make sure that both sides of any steps have handrails.  Any raised decks and porches should have guardrails on the edges.  Have any leaves, snow, or ice cleared regularly.  Use sand or salt on walking paths during winter.  Clean up any spills in your garage right away. This includes oil or grease spills. What can I do in the bathroom?  Use night lights.  Install grab bars by the toilet and in the tub and shower. Do not use towel bars as grab bars.  Use non-skid mats or decals in the tub or shower.  If  you need to sit down in the shower, use a plastic, non-slip stool.  Keep the floor dry. Clean up any water that spills on the floor as soon as it happens.  Remove soap buildup in the tub or shower regularly.  Attach bath mats securely with double-sided non-slip rug tape.  Do not have throw rugs and other things on the floor that can make you trip. What can I do in the bedroom?  Use night lights.  Make sure that you have a light by your bed that is easy to reach.  Do not use any sheets or blankets that are too big for your bed. They should not hang down onto the floor.  Have a firm chair that has side arms. You can use this for support while you get dressed.  Do not have throw rugs and other things on the floor that can make you trip. What can I do in the kitchen?  Clean up any spills right away.  Avoid walking on wet floors.  Keep items that you use a lot in easy-to-reach places.  If you need to reach something above you, use a strong step stool that has a grab bar.  Keep electrical cords out of the way.  Do  not use floor polish or wax that makes floors slippery. If you must use wax, use non-skid floor wax.  Do not have throw rugs and other things on the floor that can make you trip. What can I do with my stairs?  Do not leave any items on the stairs.  Make sure that there are handrails on both sides of the stairs and use them. Fix handrails that are broken or loose. Make sure that handrails are as long as the stairways.  Check any carpeting to make sure that it is firmly attached to the stairs. Fix any carpet that is loose or worn.  Avoid having throw rugs at the top or bottom of the stairs. If you do have throw rugs, attach them to the floor with carpet tape.  Make sure that you have a light switch at the top of the stairs and the bottom of the stairs. If you do not have them, ask someone to add them for you. What else can I do to help prevent falls?  Wear shoes  that:  Do not have high heels.  Have rubber bottoms.  Are comfortable and fit you well.  Are closed at the toe. Do not wear sandals.  If you use a stepladder:  Make sure that it is fully opened. Do not climb a closed stepladder.  Make sure that both sides of the stepladder are locked into place.  Ask someone to hold it for you, if possible.  Clearly mark and make sure that you can see:  Any grab bars or handrails.  First and last steps.  Where the edge of each step is.  Use tools that help you move around (mobility aids) if they are needed. These include:  Canes.  Walkers.  Scooters.  Crutches.  Turn on the lights when you go into a dark area. Replace any light bulbs as soon as they burn out.  Set up your furniture so you have a clear path. Avoid moving your furniture around.  If any of your floors are uneven, fix them.  If there are any pets around you, be aware of where they are.  Review your medicines with your doctor. Some medicines can make you feel dizzy. This can increase your chance of falling. Ask your doctor what other things that you can do to help prevent falls. This information is not intended to replace advice given to you by your health care provider. Make sure you discuss any questions you have with your health care provider. Document Released: 05/05/2009 Document Revised: 12/15/2015 Document Reviewed: 08/13/2014 Elsevier Interactive Patient Education  2017 [employer].

## 2019-04-15 DIAGNOSIS — E782 Mixed hyperlipidemia: Secondary | ICD-10-CM | POA: Diagnosis not present

## 2019-04-15 DIAGNOSIS — I10 Essential (primary) hypertension: Secondary | ICD-10-CM | POA: Diagnosis not present

## 2019-04-15 DIAGNOSIS — E119 Type 2 diabetes mellitus without complications: Secondary | ICD-10-CM | POA: Diagnosis not present

## 2019-04-16 ENCOUNTER — Other Ambulatory Visit: Payer: Self-pay

## 2019-04-16 ENCOUNTER — Telehealth: Payer: Self-pay | Admitting: Family Medicine

## 2019-04-16 DIAGNOSIS — E119 Type 2 diabetes mellitus without complications: Secondary | ICD-10-CM

## 2019-04-16 MED ORDER — GLIPIZIDE ER 2.5 MG PO TB24: ORAL_TABLET | ORAL | 0 refills | Status: DC

## 2019-04-16 NOTE — Telephone Encounter (Signed)
Frances Harmon is requesting a refill for glipiZIDE (GLUCOTROL XL) 2.5 MG 24 hr tablet [id number]   just til October 1st of her appointment.  [place], [place]

## 2019-04-16 NOTE — Telephone Encounter (Signed)
Done- sent to [facility]

## 2019-04-23 ENCOUNTER — Encounter: Payer: Self-pay | Admitting: Family Medicine

## 2019-04-23 ENCOUNTER — Ambulatory Visit (INDEPENDENT_AMBULATORY_CARE_PROVIDER_SITE_OTHER): Payer: Medicare Other | Admitting: Family Medicine

## 2019-04-23 ENCOUNTER — Other Ambulatory Visit: Payer: Self-pay

## 2019-04-23 DIAGNOSIS — C189 Malignant neoplasm of colon, unspecified: Secondary | ICD-10-CM | POA: Diagnosis not present

## 2019-04-23 DIAGNOSIS — K219 Gastro-esophageal reflux disease without esophagitis: Secondary | ICD-10-CM

## 2019-04-23 DIAGNOSIS — E119 Type 2 diabetes mellitus without complications: Secondary | ICD-10-CM | POA: Diagnosis not present

## 2019-04-23 DIAGNOSIS — I10 Essential (primary) hypertension: Secondary | ICD-10-CM | POA: Diagnosis not present

## 2019-04-23 DIAGNOSIS — E785 Hyperlipidemia, unspecified: Secondary | ICD-10-CM | POA: Diagnosis not present

## 2019-04-23 DIAGNOSIS — D696 Thrombocytopenia, unspecified: Secondary | ICD-10-CM

## 2019-04-23 MED ORDER — GLIPIZIDE ER 2.5 MG PO TB24: ORAL_TABLET | ORAL | 1 refills | Status: DC

## 2019-04-23 MED ORDER — ATORVASTATIN CALCIUM 10 MG PO TABS: ORAL_TABLET | ORAL | 1 refills | Status: DC

## 2019-04-23 MED ORDER — OMEPRAZOLE 20 MG PO CPDR: DELAYED_RELEASE_CAPSULE | ORAL | 1 refills | Status: DC

## 2019-04-23 MED ORDER — LOSARTAN POTASSIUM-HCTZ 100-25 MG PO TABS: ORAL_TABLET | ORAL | 1 refills | Status: DC

## 2019-04-23 MED ORDER — METFORMIN HCL 500 MG PO TABS: ORAL_TABLET | ORAL | 1 refills | Status: DC

## 2019-04-23 NOTE — Progress Notes (Signed)
Date:  04/23/2019   Name:  Frances Harmon   DOB:  [birth year]   MRN:  [id number]   Chief Complaint: Hypertension, Gastroesophageal Reflux, Hyperlipidemia, and Diabetes  Hypertension This is a chronic problem. The current episode started more than 1 year ago. The problem has been gradually improving since onset. The problem is controlled. Pertinent negatives include no anxiety, blurred vision, chest pain, headaches, malaise/fatigue, neck pain, orthopnea, palpitations, peripheral edema, PND, shortness of breath or sweats. There are no associated agents to hypertension. Risk factors for coronary artery disease include diabetes mellitus, dyslipidemia and obesity. Past treatments include angiotensin blockers and diuretics. The current treatment provides moderate improvement. There are no compliance problems.  There is no history of angina, kidney disease, CAD/MI, CVA, heart failure, left ventricular hypertrophy, PVD or retinopathy. There is no history of chronic renal disease, a hypertension causing med or renovascular disease.  Gastroesophageal Reflux She reports no abdominal pain, no belching, no chest pain, no choking, no coughing, no dysphagia, no early satiety, no globus sensation, no heartburn, no hoarse voice, no nausea, no sore throat, no stridor, no tooth decay, no water brash or no wheezing. This is a chronic problem. The problem occurs frequently. The problem has been gradually worsening. The symptoms are aggravated by certain foods. Pertinent negatives include no anemia, fatigue, melena, muscle weakness, orthopnea or weight loss. There are no known risk factors. She has tried a PPI for the symptoms. The treatment provided moderate relief.  Hyperlipidemia This is a chronic problem. The current episode started more than 1 year ago. The problem is controlled. Recent lipid tests were reviewed and are normal. Exacerbating diseases include diabetes and obesity. She has no history of chronic renal  disease. Pertinent negatives include no chest pain, myalgias or shortness of breath. Current antihyperlipidemic treatment includes statins. The current treatment provides moderate improvement of lipids. There are no compliance problems.   Diabetes She presents for her follow-up diabetic visit. She has type 2 diabetes mellitus. Her disease course has been stable. Pertinent negatives for hypoglycemia include no dizziness, headaches, nervousness/anxiousness or sweats. Pertinent negatives for diabetes include no blurred vision, no chest pain, no fatigue, no foot paresthesias, no foot ulcerations, no polydipsia, no polyphagia, no polyuria, no visual change, no weakness and no weight loss. There are no hypoglycemic complications. Symptoms are stable. There are no diabetic complications. Pertinent negatives for diabetic complications include no CVA, PVD or retinopathy. There are no known risk factors for coronary artery disease. She is following a generally healthy diet. Her breakfast blood glucose is taken between 8-9 am. An ACE inhibitor/angiotensin II receptor blocker is being taken.    Review of Systems  Constitutional: Negative for chills, fatigue, fever, malaise/fatigue and weight loss.  HENT: Negative for drooling, ear discharge, ear pain, hoarse voice and sore throat.   Eyes: Negative for blurred vision.  Respiratory: Negative for cough, choking, shortness of breath and wheezing.   Cardiovascular: Negative for chest pain, palpitations, orthopnea, leg swelling and PND.  Gastrointestinal: Negative for abdominal pain, blood in stool, constipation, diarrhea, dysphagia, heartburn, melena and nausea.  Endocrine: Negative for polydipsia, polyphagia and polyuria.  Genitourinary: Negative for dysuria, frequency, hematuria and urgency.  Musculoskeletal: Negative for back pain, myalgias, muscle weakness and neck pain.  Skin: Negative for rash.  Allergic/Immunologic: Negative for environmental allergies.   Neurological: Negative for dizziness, weakness and headaches.  Hematological: Does not bruise/bleed easily.  Psychiatric/Behavioral: Negative for suicidal ideas. The patient is not nervous/anxious.  Patient Active Problem List   Diagnosis Date Noted  . Type 2 diabetes mellitus with complication, without long-term current use of insulin ([place]) 04/28/2015  . Essential hypertension 04/28/2015  . Hyperlipidemia 04/28/2015  . Esophageal reflux 04/28/2015  . Vitamin D deficiency 04/28/2015    No Known Allergies  Past Surgical History:  Procedure Laterality Date  . COLONOSCOPY  2009   repeat in 5 years- Dr Bary Leriche  . COLONOSCOPY WITH PROPOFOL N/A 09/09/2015   Procedure: COLONOSCOPY WITH PROPOFOL;  Surgeon: Hulen Luster, MD;  Location: [facility] ENDOSCOPY;  Service: Gastroenterology;  Laterality: N/A;  . FOOT SURGERY Bilateral    Bunion removal  . INCONTINENCE SURGERY    . VAGINAL HYSTERECTOMY    . VENTRAL HERNIA REPAIR N/A 05/06/2015   Procedure: HERNIA REPAIR VENTRAL ADULT;  Surgeon: Leonie Green, MD;  Location: [facility] ORS;  Service: General;  Laterality: N/A;    Social History   Tobacco Use  . Smoking status: Never Smoker  . Smokeless tobacco: Never Used  . Tobacco comment: smokin cessation materials not required  Substance Use Topics  . Alcohol use: No    Alcohol/week: 0.0 standard drinks  . Drug use: No     Medication list has been reviewed and updated.  Current Meds  Medication Sig  . atorvastatin (LIPITOR) 10 MG tablet TAKE (1) TABLET BY MOUTH EVERY DAY AT 6 IN THE MORNING  . glipiZIDE (GLUCOTROL XL) 2.5 MG 24 hr tablet TAKE ONE TABLET BY MOUTH DAILY AT THE SAME TIME EVERY MORNING  . losartan-hydrochlorothiazide (HYZAAR) 100-25 MG tablet TAKE (1) TABLET BY MOUTH EVERY DAY AT 6 IN THE MORNING  . metFORMIN (GLUCOPHAGE) 500 MG tablet Take 1 tab BID  . omeprazole (PRILOSEC) 20 MG capsule TAKE ONE CAPSULE EACH MORNING AT THE SAME TIME    PHQ 2/9 Scores 04/23/2019  03/02/2019 10/17/2018 05/27/2018  PHQ - 2 Score 0 0 0 0  PHQ- 9 Score 0 - 0 0    BP Readings from Last 3 Encounters:  04/23/19 130/70  03/02/19 (!) 142/84  10/17/18 132/62    Physical Exam Vitals signs and nursing note reviewed.  Constitutional:      Appearance: She is well-developed.  HENT:     Head: Normocephalic.     Right Ear: Tympanic membrane, ear canal and external ear normal.     Left Ear: Tympanic membrane, ear canal and external ear normal.     Nose: Nose normal.     Mouth/Throat:     Mouth: Mucous membranes are moist.  Eyes:     General: Lids are everted, no foreign bodies appreciated. No scleral icterus.       Left eye: No foreign body or hordeolum.     Conjunctiva/sclera: Conjunctivae normal.     Right eye: Right conjunctiva is not injected.     Left eye: Left conjunctiva is not injected.     Pupils: Pupils are equal, round, and reactive to light.  Neck:     Musculoskeletal: Normal range of motion and neck supple.     Thyroid: No thyromegaly.     Vascular: No JVD.     Trachea: No tracheal deviation.  Cardiovascular:     Rate and Rhythm: Normal rate and regular rhythm.     Heart sounds: Normal heart sounds, S1 normal and S2 normal. No murmur. No systolic murmur. No diastolic murmur. No friction rub. No gallop. No S3 or S4 sounds.   Pulmonary:     Effort: Pulmonary effort is normal.  No respiratory distress.     Breath sounds: Normal breath sounds. No wheezing, rhonchi or rales.  Abdominal:     General: Bowel sounds are normal.     Palpations: Abdomen is soft. There is no mass.     Tenderness: There is no abdominal tenderness. There is no guarding or rebound.  Musculoskeletal: Normal range of motion.        General: No tenderness.     Right lower leg: No edema.     Left lower leg: No edema.  Lymphadenopathy:     Cervical: No cervical adenopathy.  Skin:    General: Skin is warm.     Capillary Refill: Capillary refill takes less than 2 seconds.     Findings:  No bruising, erythema or rash.  Neurological:     Mental Status: She is alert and oriented to person, place, and time.     Cranial Nerves: No cranial nerve deficit.     Deep Tendon Reflexes: Reflexes normal.  Psychiatric:        Mood and Affect: Mood is not anxious or depressed.     Wt Readings from Last 3 Encounters:  04/23/19 207 lb (93.9 kg)  03/02/19 206 lb 12.8 oz (93.8 kg)  10/17/18 202 lb (91.6 kg)    BP 130/70   Pulse 64   Ht 5\' 8"  (1.727 m)   Wt 207 lb (93.9 kg)   BMI 31.47 kg/m   Assessment and Plan: 1. Hyperlipidemia, unspecified hyperlipidemia type Chronic.  Controlled.  Continue atorvastatin 10 mg once a day. - atorvastatin (LIPITOR) 10 MG tablet; 1 daily  Dispense: 90 tablet; Refill: 1  2. Type 2 diabetes mellitus without complication, without long-term current use of insulin (HCC) Chronic.  Controlled.  Continue glipizide 2.5 mg once a day and metformin 500 mg 1 twice a day.  Will check hemoglobin A1c. - HgB A1c - glipiZIDE (GLUCOTROL XL) 2.5 MG 24 hr tablet; TAKE ONE TABLET BY MOUTH DAILY AT THE SAME TIME EVERY MORNING  Dispense: 90 tablet; Refill: 1 - metFORMIN (GLUCOPHAGE) 500 MG tablet; Take 1 tab BID  Dispense: 180 tablet; Refill: 1  3. Essential hypertension Chronic.  Controlled.  Continue losartan- hydrochlorothiazide 100-25 mg every morning. - losartan-hydrochlorothiazide (HYZAAR) 100-25 MG tablet; TAKE (1) TABLET BY MOUTH EVERY DAY AT 6 IN THE MORNING  Dispense: 90 tablet; Refill: 1  4. GERD without esophagitis Chronic.  Controlled.  Continue Prilosec 20 mg once a day. - omeprazole (PRILOSEC) 20 MG capsule; TAKE ONE CAPSULE EACH MORNING AT THE SAME TIME  Dispense: 90 capsule; Refill: 1  5. Malignant neoplasm of colon, unspecified part of colon [facility]) Patient was followed by Dr. [facility] gastro[facility]ology whom is not there now patient had an evaluation and was unable to do a barium enema but has not been back for follow-up for an uncertain  amount of time.  Furthermore patient has been noticing some discomfort in the left lower quadrant of the abdominal area.  Will be referred back to gastroenterology for evaluation and follow-up of abnormal exams in the past - Ambulatory referral to Gastroenterology  6. Thrombocytopenia ([place]) Patient with history of thrombocytopenia.   We will evaluate with CBC. - CBC with Differential/Platelet

## 2019-04-24 LAB — CBC WITH DIFFERENTIAL/PLATELET
Basophils Absolute: 0 10*3/uL (ref 0.0–0.2)
Basos: 1 %
EOS (ABSOLUTE): 0.1 10*3/uL (ref 0.0–0.4)
Eos: 1 %
Hematocrit: 39.4 % (ref 34.0–46.6)
Hemoglobin: 13.5 g/dL (ref 11.1–15.9)
Immature Grans (Abs): 0 10*3/uL (ref 0.0–0.1)
Immature Granulocytes: 0 %
Lymphocytes Absolute: 1.8 10*3/uL (ref 0.7–3.1)
Lymphs: 25 %
MCH: 29.2 pg (ref 26.6–33.0)
MCHC: 34.3 g/dL (ref 31.5–35.7)
MCV: 85 fL (ref 79–97)
Monocytes Absolute: 0.6 10*3/uL (ref 0.1–0.9)
Monocytes: 9 %
Neutrophils Absolute: 4.8 10*3/uL (ref 1.4–7.0)
Neutrophils: 64 %
Platelets: 139 10*3/uL — ABNORMAL LOW (ref 150–450)
RBC: 4.62 x10E6/uL (ref 3.77–5.28)
RDW: 12.7 % (ref 11.7–15.4)
WBC: 7.4 10*3/uL (ref 3.4–10.8)

## 2019-04-24 LAB — HEMOGLOBIN A1C
Est. average glucose Bld gHb Est-mCnc: 120 mg/dL
Hgb A1c MFr Bld: 5.8 % — ABNORMAL HIGH (ref 4.8–5.6)

## 2019-05-27 DIAGNOSIS — H02883 Meibomian gland dysfunction of right eye, unspecified eyelid: Secondary | ICD-10-CM | POA: Diagnosis not present

## 2019-05-27 DIAGNOSIS — E119 Type 2 diabetes mellitus without complications: Secondary | ICD-10-CM | POA: Diagnosis not present

## 2019-05-27 DIAGNOSIS — H1045 Other chronic allergic conjunctivitis: Secondary | ICD-10-CM | POA: Diagnosis not present

## 2019-05-27 DIAGNOSIS — H02886 Meibomian gland dysfunction of left eye, unspecified eyelid: Secondary | ICD-10-CM | POA: Diagnosis not present

## 2019-06-15 DIAGNOSIS — K5730 Diverticulosis of large intestine without perforation or abscess without bleeding: Secondary | ICD-10-CM | POA: Diagnosis not present

## 2019-06-15 DIAGNOSIS — Z1211 Encounter for screening for malignant neoplasm of colon: Secondary | ICD-10-CM | POA: Diagnosis not present

## 2019-06-15 DIAGNOSIS — K219 Gastro-esophageal reflux disease without esophagitis: Secondary | ICD-10-CM | POA: Diagnosis not present

## 2019-06-16 ENCOUNTER — Encounter: Payer: Self-pay | Admitting: Family Medicine

## 2019-06-16 ENCOUNTER — Other Ambulatory Visit: Payer: Self-pay | Admitting: Student

## 2019-06-16 ENCOUNTER — Other Ambulatory Visit: Payer: Self-pay

## 2019-06-16 ENCOUNTER — Ambulatory Visit (INDEPENDENT_AMBULATORY_CARE_PROVIDER_SITE_OTHER): Payer: Medicare Other | Admitting: Family Medicine

## 2019-06-16 VITALS — BP 168/88 | HR 80 | Ht 68.0 in | Wt 210.0 lb

## 2019-06-16 DIAGNOSIS — I10 Essential (primary) hypertension: Secondary | ICD-10-CM | POA: Diagnosis not present

## 2019-06-16 DIAGNOSIS — Z1211 Encounter for screening for malignant neoplasm of colon: Secondary | ICD-10-CM

## 2019-06-16 DIAGNOSIS — Z23 Encounter for immunization: Secondary | ICD-10-CM | POA: Diagnosis not present

## 2019-06-16 MED ORDER — AMLODIPINE BESYLATE 2.5 MG PO TABS: 2.5000 mg | ORAL_TABLET | Freq: Every day | ORAL | 0 refills | Status: DC

## 2019-06-16 NOTE — Progress Notes (Signed)
Date:  06/16/2019   Name:  Frances Harmon   DOB:  12-21-1945   MRN:  [id number]   Chief Complaint: Hypertension (had elevated readings yesterday at [facility] and today at work 180/90) and Flu Vaccine  Hypertension This is a chronic problem. The current episode started more than 1 year ago. The problem has been waxing and waning since onset. The problem is uncontrolled. Pertinent negatives include no anxiety, blurred vision, chest pain, headaches, malaise/fatigue, neck pain, orthopnea, palpitations, peripheral edema, PND, shortness of breath or sweats. There are no associated agents to hypertension. There are no known risk factors for coronary artery disease.    Lab Results  Component Value Date   CREATININE 0.71 10/17/2018   BUN 10 10/17/2018   NA 144 10/17/2018   K 4.2 10/17/2018   CL 103 10/17/2018   CO2 25 10/17/2018   Lab Results  Component Value Date   CHOL 154 10/17/2018   HDL 52 10/17/2018   LDLCALC 84 10/17/2018   TRIG 92 10/17/2018   CHOLHDL 2.4 09/12/2017   No results found for: TSH Lab Results  Component Value Date   HGBA1C 5.8 (H) 04/23/2019     Review of Systems  Constitutional: Negative for chills, fever and malaise/fatigue.  HENT: Negative for drooling, ear discharge, ear pain and sore throat.   Eyes: Negative for blurred vision.  Respiratory: Negative for cough, shortness of breath and wheezing.   Cardiovascular: Negative for chest pain, palpitations, orthopnea, leg swelling and PND.  Gastrointestinal: Negative for abdominal pain, blood in stool, constipation, diarrhea and nausea.  Endocrine: Negative for polydipsia.  Genitourinary: Negative for dysuria, frequency, hematuria and urgency.  Musculoskeletal: Negative for back pain, myalgias and neck pain.  Skin: Negative for rash.  Allergic/Immunologic: Negative for environmental allergies.  Neurological: Negative for dizziness and headaches.  Hematological: Does not bruise/bleed easily.   Psychiatric/Behavioral: Negative for suicidal ideas. The patient is not nervous/anxious.     Patient Active Problem List   Diagnosis Date Noted  . Thrombocytopenia ([place]) 04/23/2019  . Type 2 diabetes mellitus with complication, without long-term current use of insulin ([place]) 04/28/2015  . Essential hypertension 04/28/2015  . Hyperlipidemia 04/28/2015  . Esophageal reflux 04/28/2015  . Vitamin D deficiency 04/28/2015    No Known Allergies  Past Surgical History:  Procedure Laterality Date  . COLONOSCOPY  2009   repeat in 5 years- Dr Bary Leriche  . COLONOSCOPY WITH PROPOFOL N/A 09/09/2015   Procedure: COLONOSCOPY WITH PROPOFOL;  Surgeon: Hulen Luster, MD;  Location: [facility] ENDOSCOPY;  Service: Gastroenterology;  Laterality: N/A;  . FOOT SURGERY Bilateral    Bunion removal  . INCONTINENCE SURGERY    . VAGINAL HYSTERECTOMY    . VENTRAL HERNIA REPAIR N/A 05/06/2015   Procedure: HERNIA REPAIR VENTRAL ADULT;  Surgeon: Leonie Green, MD;  Location: [facility] ORS;  Service: General;  Laterality: N/A;    Social History   Tobacco Use  . Smoking status: Never Smoker  . Smokeless tobacco: Never Used  . Tobacco comment: smokin cessation materials not required  Substance Use Topics  . Alcohol use: No    Alcohol/week: 0.0 standard drinks  . Drug use: No     Medication list has been reviewed and updated.  Current Meds  Medication Sig  . atorvastatin (LIPITOR) 10 MG tablet 1 daily  . glipiZIDE (GLUCOTROL XL) 2.5 MG 24 hr tablet TAKE ONE TABLET BY MOUTH DAILY AT THE SAME TIME EVERY MORNING  . losartan-hydrochlorothiazide (HYZAAR) 100-25 MG tablet TAKE (  1) TABLET BY MOUTH EVERY DAY AT 6 IN THE MORNING  . metFORMIN (GLUCOPHAGE) 500 MG tablet Take 1 tab BID  . omeprazole (PRILOSEC) 20 MG capsule TAKE ONE CAPSULE EACH MORNING AT THE SAME TIME    PHQ 2/9 Scores 04/23/2019 03/02/2019 10/17/2018 05/27/2018  PHQ - 2 Score 0 0 0 0  PHQ- 9 Score 0 - 0 0    BP Readings from Last 3 Encounters:   06/16/19 (!) 168/88  04/23/19 130/70  03/02/19 (!) 142/84    Physical Exam Vitals signs and nursing note reviewed.  Constitutional:      Appearance: She is well-developed.  HENT:     Head: Normocephalic.     Right Ear: Tympanic membrane and external ear normal.     Left Ear: Tympanic membrane and external ear normal.     Nose: Nose normal.     Mouth/Throat:     Mouth: Mucous membranes are moist.  Eyes:     General: Lids are everted, no foreign bodies appreciated. No scleral icterus.       Left eye: No foreign body or hordeolum.     Conjunctiva/sclera: Conjunctivae normal.     Right eye: Right conjunctiva is not injected.     Left eye: Left conjunctiva is not injected.     Pupils: Pupils are equal, round, and reactive to light.  Neck:     Musculoskeletal: Normal range of motion and neck supple.     Thyroid: No thyromegaly.     Vascular: No JVD.     Trachea: No tracheal deviation.  Cardiovascular:     Rate and Rhythm: Normal rate and regular rhythm.     Heart sounds: Normal heart sounds, S1 normal and S2 normal. No murmur. No systolic murmur. No diastolic murmur. No friction rub. No gallop. No S3 or S4 sounds.   Pulmonary:     Effort: Pulmonary effort is normal. No respiratory distress.     Breath sounds: Normal breath sounds. No wheezing, rhonchi or rales.  Abdominal:     General: Bowel sounds are normal.     Palpations: Abdomen is soft. There is no mass.     Tenderness: There is no abdominal tenderness. There is no guarding or rebound.  Musculoskeletal: Normal range of motion.        General: No tenderness.  Lymphadenopathy:     Cervical: No cervical adenopathy.  Skin:    General: Skin is warm.     Findings: No rash.  Neurological:     Mental Status: She is alert and oriented to person, place, and time.     Cranial Nerves: No cranial nerve deficit.     Deep Tendon Reflexes: Reflexes normal.  Psychiatric:        Mood and Affect: Mood is not anxious or depressed.      Wt Readings from Last 3 Encounters:  06/16/19 210 lb (95.3 kg)  04/23/19 207 lb (93.9 kg)  03/02/19 206 lb 12.8 oz (93.8 kg)    BP (!) 168/88   Pulse 80   Ht 5\' 8"  (1.727 m)   Wt 210 lb (95.3 kg)   BMI 31.93 kg/m   Assessment and Plan:  1. Essential hypertension Chronic.  Uncontrolled.  Stable.  Patient had elevated reading at cardiology yesterday and will repeat elevated reading at school today.  We will continue losartan hydrochlorothiazide 100-25 at current dosing as well as add amlodipine 2.5 mg once a day as well patient will return next week for a blood  pressure recheck. - amLODipine (NORVASC) 2.5 MG tablet; Take 1 tablet (2.5 mg total) by mouth daily.  Dispense: 30 tablet; Refill: 0  2. Influenza vaccine needed Discussed and administered. - Flu Vaccine QUAD High Dose(Fluad)

## 2019-06-29 ENCOUNTER — Other Ambulatory Visit: Payer: Self-pay

## 2019-06-29 ENCOUNTER — Encounter: Payer: Self-pay | Admitting: Family Medicine

## 2019-06-29 ENCOUNTER — Ambulatory Visit (INDEPENDENT_AMBULATORY_CARE_PROVIDER_SITE_OTHER): Payer: Medicare Other | Admitting: Family Medicine

## 2019-06-29 DIAGNOSIS — I10 Essential (primary) hypertension: Secondary | ICD-10-CM | POA: Diagnosis not present

## 2019-06-29 MED ORDER — AMLODIPINE BESYLATE 2.5 MG PO TABS: 2.5000 mg | ORAL_TABLET | Freq: Every day | ORAL | 1 refills | Status: DC

## 2019-06-29 NOTE — Progress Notes (Signed)
Date:  06/29/2019   Name:  Frances Harmon   DOB:  Jul 19, 1946   MRN:  [id number]   Chief Complaint: Hypertension (follow up since adding Amlodipine in november)  Hypertension This is a chronic problem. The current episode started more than 1 year ago. The problem has been gradually improving since onset. The problem is controlled. Pertinent negatives include no anxiety, blurred vision, chest pain, headaches, malaise/fatigue, neck pain, orthopnea, palpitations, peripheral edema, PND, shortness of breath or sweats. There are no associated agents to hypertension. Past treatments include angiotensin blockers, diuretics and calcium channel blockers. The current treatment provides moderate improvement. There are no compliance problems.  There is no history of angina, kidney disease, CAD/MI, CVA, heart failure, left ventricular hypertrophy, PVD or retinopathy. There is no history of chronic renal disease, a hypertension causing med or renovascular disease.    Lab Results  Component Value Date   CREATININE 0.71 10/17/2018   BUN 10 10/17/2018   NA 144 10/17/2018   K 4.2 10/17/2018   CL 103 10/17/2018   CO2 25 10/17/2018   Lab Results  Component Value Date   CHOL 154 10/17/2018   HDL 52 10/17/2018   LDLCALC 84 10/17/2018   TRIG 92 10/17/2018   CHOLHDL 2.4 09/12/2017   No results found for: TSH Lab Results  Component Value Date   HGBA1C 5.8 (H) 04/23/2019     Review of Systems  Constitutional: Negative for chills, fever and malaise/fatigue.  HENT: Negative for drooling, ear discharge, ear pain and sore throat.   Eyes: Negative for blurred vision.  Respiratory: Negative for cough, shortness of breath and wheezing.   Cardiovascular: Negative for chest pain, palpitations, orthopnea, leg swelling and PND.  Gastrointestinal: Negative for abdominal pain, blood in stool, constipation, diarrhea and nausea.  Endocrine: Negative for polydipsia.  Genitourinary: Negative for dysuria,  frequency, hematuria and urgency.  Musculoskeletal: Negative for back pain, myalgias and neck pain.  Skin: Negative for rash.  Allergic/Immunologic: Negative for environmental allergies.  Neurological: Positive for dizziness. Negative for headaches.  Hematological: Does not bruise/bleed easily.  Psychiatric/Behavioral: Negative for suicidal ideas. The patient is not nervous/anxious.     Patient Active Problem List   Diagnosis Date Noted  . Thrombocytopenia ([place]) 04/23/2019  . Type 2 diabetes mellitus with complication, without long-term current use of insulin ([place]) 04/28/2015  . Essential hypertension 04/28/2015  . Hyperlipidemia 04/28/2015  . Esophageal reflux 04/28/2015  . Vitamin D deficiency 04/28/2015    No Known Allergies  Past Surgical History:  Procedure Laterality Date  . COLONOSCOPY  2009   repeat in 5 years- Dr Bary Leriche  . COLONOSCOPY WITH PROPOFOL N/A 09/09/2015   Procedure: COLONOSCOPY WITH PROPOFOL;  Surgeon: Hulen Luster, MD;  Location: [facility] ENDOSCOPY;  Service: Gastroenterology;  Laterality: N/A;  . FOOT SURGERY Bilateral    Bunion removal  . INCONTINENCE SURGERY    . VAGINAL HYSTERECTOMY    . VENTRAL HERNIA REPAIR N/A 05/06/2015   Procedure: HERNIA REPAIR VENTRAL ADULT;  Surgeon: Leonie Green, MD;  Location: [facility] ORS;  Service: General;  Laterality: N/A;    Social History   Tobacco Use  . Smoking status: Never Smoker  . Smokeless tobacco: Never Used  . Tobacco comment: smokin cessation materials not required  Substance Use Topics  . Alcohol use: No    Alcohol/week: 0.0 standard drinks  . Drug use: No     Medication list has been reviewed and updated.  Current Meds  Medication Sig  .  amLODipine (NORVASC) 2.5 MG tablet Take 1 tablet (2.5 mg total) by mouth daily.  Marland Kitchen atorvastatin (LIPITOR) 10 MG tablet 1 daily  . glipiZIDE (GLUCOTROL XL) 2.5 MG 24 hr tablet TAKE ONE TABLET BY MOUTH DAILY AT THE SAME TIME EVERY MORNING  .  losartan-hydrochlorothiazide (HYZAAR) 100-25 MG tablet TAKE (1) TABLET BY MOUTH EVERY DAY AT 6 IN THE MORNING  . metFORMIN (GLUCOPHAGE) 500 MG tablet Take 1 tab BID  . omeprazole (PRILOSEC) 20 MG capsule TAKE ONE CAPSULE EACH MORNING AT THE SAME TIME    PHQ 2/9 Scores 04/23/2019 03/02/2019 10/17/2018 05/27/2018  PHQ - 2 Score 0 0 0 0  PHQ- 9 Score 0 - 0 0    BP Readings from Last 3 Encounters:  06/29/19 (!) 142/80  06/16/19 (!) 168/88  04/23/19 130/70    Physical Exam Vitals signs and nursing note reviewed.  Constitutional:      Appearance: She is well-developed.  HENT:     Head: Normocephalic.     Right Ear: Tympanic membrane and external ear normal.     Left Ear: Tympanic membrane and external ear normal.     Nose: Nose normal.     Mouth/Throat:     Mouth: Mucous membranes are moist.  Eyes:     General: Lids are everted, no foreign bodies appreciated. No scleral icterus.       Left eye: No foreign body or hordeolum.     Conjunctiva/sclera: Conjunctivae normal.     Right eye: Right conjunctiva is not injected.     Left eye: Left conjunctiva is not injected.     Pupils: Pupils are equal, round, and reactive to light.  Neck:     Musculoskeletal: Normal range of motion and neck supple.     Thyroid: No thyromegaly.     Vascular: No carotid bruit or JVD.     Trachea: No tracheal deviation.  Cardiovascular:     Rate and Rhythm: Normal rate and regular rhythm.     Heart sounds: Normal heart sounds. No murmur. No friction rub. No gallop.   Pulmonary:     Effort: Pulmonary effort is normal. No respiratory distress.     Breath sounds: Normal breath sounds. No wheezing, rhonchi or rales.  Abdominal:     General: Bowel sounds are normal.     Palpations: Abdomen is soft. There is no mass.     Tenderness: There is no abdominal tenderness. There is no guarding or rebound.  Musculoskeletal: Normal range of motion.        General: No tenderness.  Lymphadenopathy:     Cervical: No  cervical adenopathy.  Skin:    General: Skin is warm.     Coloration: Skin is not jaundiced or pale.     Findings: No bruising, erythema, lesion or rash.  Neurological:     Mental Status: She is alert and oriented to person, place, and time.     Cranial Nerves: No cranial nerve deficit.     Deep Tendon Reflexes: Reflexes normal.  Psychiatric:        Mood and Affect: Mood is not anxious or depressed.     Wt Readings from Last 3 Encounters:  06/29/19 211 lb (95.7 kg)  06/16/19 210 lb (95.3 kg)  04/23/19 207 lb (93.9 kg)    BP (!) 142/80   Pulse 64   Ht 5\' 8"  (1.727 m)   Wt 211 lb (95.7 kg)   BMI 32.08 kg/m   Assessment and Plan:  1.  Essential hypertension Chronic.  Controlled.  Stable.  Except for one episode of vertigo that I think was not related to the blood pressure or medication patient has done well.  She will continue on her current regimen of losartan and hydrochlorothiazide as well as the recent addition of amlodipine 2.5 mg daily.  We will recheck patient in 3 months at which time we will do renal panel and reassess blood pressure. - amLODipine (NORVASC) 2.5 MG tablet; Take 1 tablet (2.5 mg total) by mouth daily.  Dispense: 90 tablet; Refill: 1

## 2019-07-14 ENCOUNTER — Ambulatory Visit
Admission: RE | Admit: 2019-07-14 | Discharge: 2019-07-14 | Disposition: A | Discharge status: Home / Self Care | Payer: Medicare Other | Source: Ambulatory Visit | Attending: Student | Admitting: Student

## 2019-07-14 DIAGNOSIS — Z1211 Encounter for screening for malignant neoplasm of colon: Secondary | ICD-10-CM

## 2019-07-14 DIAGNOSIS — K5730 Diverticulosis of large intestine without perforation or abscess without bleeding: Secondary | ICD-10-CM | POA: Diagnosis not present

## 2019-08-12 DIAGNOSIS — Z01812 Encounter for preprocedural laboratory examination: Secondary | ICD-10-CM | POA: Diagnosis not present

## 2019-08-14 ENCOUNTER — Ambulatory Visit: Payer: Medicare Other | Admitting: Family Medicine

## 2019-08-15 DIAGNOSIS — K5730 Diverticulosis of large intestine without perforation or abscess without bleeding: Secondary | ICD-10-CM | POA: Diagnosis not present

## 2019-08-15 DIAGNOSIS — K56699 Other intestinal obstruction unspecified as to partial versus complete obstruction: Secondary | ICD-10-CM | POA: Diagnosis not present

## 2019-08-15 DIAGNOSIS — K56600 Partial intestinal obstruction, unspecified as to cause: Secondary | ICD-10-CM | POA: Diagnosis not present

## 2019-08-15 DIAGNOSIS — R933 Abnormal findings on diagnostic imaging of other parts of digestive tract: Secondary | ICD-10-CM | POA: Diagnosis not present

## 2019-08-15 DIAGNOSIS — Z538 Procedure and treatment not carried out for other reasons: Secondary | ICD-10-CM | POA: Diagnosis not present

## 2019-08-15 DIAGNOSIS — K640 First degree hemorrhoids: Secondary | ICD-10-CM | POA: Diagnosis not present

## 2019-09-21 DIAGNOSIS — R933 Abnormal findings on diagnostic imaging of other parts of digestive tract: Secondary | ICD-10-CM | POA: Diagnosis not present

## 2019-09-21 DIAGNOSIS — Z01812 Encounter for preprocedural laboratory examination: Secondary | ICD-10-CM | POA: Diagnosis not present

## 2019-09-21 DIAGNOSIS — K639 Disease of intestine, unspecified: Secondary | ICD-10-CM | POA: Diagnosis not present

## 2019-09-21 LAB — HM DIABETES EYE EXAM

## 2019-09-29 ENCOUNTER — Ambulatory Visit: Payer: Medicare Other | Admitting: Family Medicine

## 2019-10-21 ENCOUNTER — Other Ambulatory Visit: Payer: Self-pay | Admitting: Family Medicine

## 2019-10-21 DIAGNOSIS — I10 Essential (primary) hypertension: Secondary | ICD-10-CM

## 2019-10-22 ENCOUNTER — Other Ambulatory Visit: Payer: Self-pay

## 2019-10-22 ENCOUNTER — Encounter: Payer: Self-pay | Admitting: Family Medicine

## 2019-10-22 ENCOUNTER — Ambulatory Visit (INDEPENDENT_AMBULATORY_CARE_PROVIDER_SITE_OTHER): Payer: Medicare Other | Admitting: Family Medicine

## 2019-10-22 VITALS — BP 138/78 | HR 64 | Ht 68.0 in | Wt 212.0 lb

## 2019-10-22 DIAGNOSIS — I10 Essential (primary) hypertension: Secondary | ICD-10-CM | POA: Diagnosis not present

## 2019-10-22 DIAGNOSIS — E785 Hyperlipidemia, unspecified: Secondary | ICD-10-CM

## 2019-10-22 DIAGNOSIS — E119 Type 2 diabetes mellitus without complications: Secondary | ICD-10-CM | POA: Diagnosis not present

## 2019-10-22 DIAGNOSIS — K219 Gastro-esophageal reflux disease without esophagitis: Secondary | ICD-10-CM

## 2019-10-22 MED ORDER — ATORVASTATIN CALCIUM 10 MG PO TABS: ORAL_TABLET | ORAL | 1 refills | Status: DC

## 2019-10-22 MED ORDER — OMEPRAZOLE 20 MG PO CPDR: DELAYED_RELEASE_CAPSULE | ORAL | 1 refills | Status: DC

## 2019-10-22 MED ORDER — AMLODIPINE BESYLATE 2.5 MG PO TABS: 2.5000 mg | ORAL_TABLET | Freq: Every day | ORAL | 1 refills | Status: DC

## 2019-10-22 MED ORDER — METFORMIN HCL 500 MG PO TABS: ORAL_TABLET | ORAL | 1 refills | Status: DC

## 2019-10-22 MED ORDER — GLIPIZIDE ER 2.5 MG PO TB24: ORAL_TABLET | ORAL | 1 refills | Status: DC

## 2019-10-22 MED ORDER — LOSARTAN POTASSIUM-HCTZ 100-25 MG PO TABS: ORAL_TABLET | ORAL | 1 refills | Status: DC

## 2019-10-22 NOTE — Progress Notes (Signed)
Date:  10/22/2019   Name:  Frances Harmon   DOB:  Nov 15, 1945   MRN:  [id number]   Chief Complaint: Diabetes (med refill - foot exam ), Hypertension, Hyperlipidemia, and Gastroesophageal Reflux  Diabetes She presents for her follow-up diabetic visit. She has type 2 diabetes mellitus. Her disease course has been stable. There are no hypoglycemic associated symptoms. Pertinent negatives for hypoglycemia include no dizziness, headaches, nervousness/anxiousness or sweats. There are no diabetic associated symptoms. Pertinent negatives for diabetes include no blurred vision, no chest pain, no fatigue, no foot paresthesias, no foot ulcerations, no polydipsia, no polyphagia, no polyuria, no visual change, no weakness and no weight loss. There are no hypoglycemic complications. Symptoms are stable. There are no diabetic complications. Pertinent negatives for diabetic complications include no CVA, PVD or retinopathy. Risk factors for coronary artery disease include dyslipidemia and hypertension. Current diabetic treatment includes oral agent (dual therapy). She is compliant with treatment all of the time. She is following a generally healthy diet. Meal planning includes avoidance of concentrated sweets and carbohydrate counting. An ACE inhibitor/angiotensin II receptor blocker is being taken. Eye exam is current.  Hypertension This is a chronic problem. The current episode started more than 1 year ago. The problem has been gradually improving since onset. The problem is controlled. Pertinent negatives include no anxiety, blurred vision, chest pain, headaches, malaise/fatigue, neck pain, palpitations, peripheral edema, PND, shortness of breath or sweats. Risk factors for coronary artery disease include dyslipidemia and obesity. Past treatments include calcium channel blockers, angiotensin blockers and diuretics. The current treatment provides moderate improvement. There are no compliance problems.  There is no  history of angina, kidney disease, CAD/MI, CVA, heart failure, left ventricular hypertrophy, PVD or retinopathy. There is no history of chronic renal disease, a hypertension causing med or renovascular disease.  Hyperlipidemia This is a chronic problem. The current episode started more than 1 year ago. The problem is controlled. Recent lipid tests were reviewed and are normal. Exacerbating diseases include diabetes and obesity. She has no history of chronic renal disease, liver disease or nephrotic syndrome. Factors aggravating her hyperlipidemia include thiazides. Pertinent negatives include no chest pain, focal sensory loss, focal weakness, leg pain, myalgias or shortness of breath. Current antihyperlipidemic treatment includes statins. The current treatment provides moderate improvement of lipids. There are no compliance problems.  Risk factors for coronary artery disease include diabetes mellitus, post-menopausal and hypertension.  Gastroesophageal Reflux She reports no abdominal pain, no belching, no chest pain, no choking, no coughing, no dysphagia, no early satiety, no heartburn, no hoarse voice, no nausea, no sore throat, no stridor or no wheezing. This is a chronic problem. The current episode started more than 1 year ago. The problem has been gradually improving. The symptoms are aggravated by certain foods. Pertinent negatives include no anemia, fatigue, melena, muscle weakness, orthopnea or weight loss. She has tried a PPI for the symptoms.    Lab Results  Component Value Date   CREATININE 0.71 10/17/2018   BUN 10 10/17/2018   NA 144 10/17/2018   K 4.2 10/17/2018   CL 103 10/17/2018   CO2 25 10/17/2018   Lab Results  Component Value Date   CHOL 154 10/17/2018   HDL 52 10/17/2018   LDLCALC 84 10/17/2018   TRIG 92 10/17/2018   CHOLHDL 2.4 09/12/2017   No results found for: TSH Lab Results  Component Value Date   HGBA1C 5.8 (H) 04/23/2019   Lab Results  Component Value Date  WBC 7.4 04/23/2019   HGB 13.5 04/23/2019   HCT 39.4 04/23/2019   MCV 85 04/23/2019   PLT 139 (L) 04/23/2019   Lab Results  Component Value Date   ALT 15 04/09/2018   AST 15 04/09/2018   ALKPHOS 72 04/09/2018   BILITOT 0.5 04/09/2018     Review of Systems  Constitutional: Negative.  Negative for chills, fatigue, fever, malaise/fatigue, unexpected weight change and weight loss.  HENT: Negative for congestion, ear discharge, ear pain, hoarse voice, rhinorrhea, sinus pressure, sneezing and sore throat.   Eyes: Negative for blurred vision, photophobia, pain, discharge, redness and itching.  Respiratory: Negative for cough, choking, shortness of breath, wheezing and stridor.   Cardiovascular: Negative for chest pain, palpitations and PND.  Gastrointestinal: Negative for abdominal pain, blood in stool, constipation, diarrhea, dysphagia, heartburn, melena, nausea and vomiting.  Endocrine: Negative for cold intolerance, heat intolerance, polydipsia, polyphagia and polyuria.  Genitourinary: Negative for dysuria, flank pain, frequency, hematuria, menstrual problem, pelvic pain, urgency, vaginal bleeding and vaginal discharge.  Musculoskeletal: Negative for arthralgias, back pain, myalgias, muscle weakness and neck pain.  Skin: Negative for rash.  Allergic/Immunologic: Negative for environmental allergies and food allergies.  Neurological: Negative for dizziness, focal weakness, weakness, light-headedness, numbness and headaches.  Hematological: Negative for adenopathy. Does not bruise/bleed easily.  Psychiatric/Behavioral: Negative for dysphoric mood. The patient is not nervous/anxious.     Patient Active Problem List   Diagnosis Date Noted  . Thrombocytopenia ([place]) 04/23/2019  . Type 2 diabetes mellitus with complication, without long-term current use of insulin ([place]) 04/28/2015  . Essential hypertension 04/28/2015  . Hyperlipidemia 04/28/2015  . Esophageal reflux 04/28/2015  . Vitamin D  deficiency 04/28/2015    No Known Allergies  Past Surgical History:  Procedure Laterality Date  . COLONOSCOPY  2009   repeat in 5 years- Dr Bary Leriche  . COLONOSCOPY WITH PROPOFOL N/A 09/09/2015   Procedure: COLONOSCOPY WITH PROPOFOL;  Surgeon: Hulen Luster, MD;  Location: [facility] ENDOSCOPY;  Service: Gastroenterology;  Laterality: N/A;  . FOOT SURGERY Bilateral    Bunion removal  . INCONTINENCE SURGERY    . VAGINAL HYSTERECTOMY    . VENTRAL HERNIA REPAIR N/A 05/06/2015   Procedure: HERNIA REPAIR VENTRAL ADULT;  Surgeon: Leonie Green, MD;  Location: [facility] ORS;  Service: General;  Laterality: N/A;    Social History   Tobacco Use  . Smoking status: Never Smoker  . Smokeless tobacco: Never Used  . Tobacco comment: smokin cessation materials not required  Substance Use Topics  . Alcohol use: No    Alcohol/week: 0.0 standard drinks  . Drug use: No     Medication list has been reviewed and updated.  Current Meds  Medication Sig  . amLODipine (NORVASC) 2.5 MG tablet Take 1 tablet (2.5 mg total) by mouth daily.  Marland Kitchen atorvastatin (LIPITOR) 10 MG tablet 1 daily  . glipiZIDE (GLUCOTROL XL) 2.5 MG 24 hr tablet TAKE ONE TABLET BY MOUTH DAILY AT THE SAME TIME EVERY MORNING  . losartan-hydrochlorothiazide (HYZAAR) 100-25 MG tablet TAKE (1) TABLET BY MOUTH EVERY DAY AT 6 IN THE MORNING  . metFORMIN (GLUCOPHAGE) 500 MG tablet Take 1 tab BID  . omeprazole (PRILOSEC) 20 MG capsule TAKE ONE CAPSULE EACH MORNING AT THE SAME TIME    PHQ 2/9 Scores 10/22/2019 04/23/2019 03/02/2019 10/17/2018  PHQ - 2 Score 0 0 0 0  PHQ- 9 Score 0 0 - 0    BP Readings from Last 3 Encounters:  10/22/19 138/78  06/29/19 Marland Kitchen)  142/80  06/16/19 (!) 168/88    Physical Exam Vitals and nursing note reviewed.  Constitutional:      General: She is not in acute distress.    Appearance: She is not diaphoretic.  HENT:     Head: Normocephalic and atraumatic.     Right Ear: Tympanic membrane, ear canal and external ear  normal.     Left Ear: Tympanic membrane, ear canal and external ear normal.     Nose: Nose normal. No congestion or rhinorrhea.     Mouth/Throat:     Mouth: Mucous membranes are moist.  Eyes:     General:        Right eye: No discharge.        Left eye: No discharge.     Conjunctiva/sclera: Conjunctivae normal.     Pupils: Pupils are equal, round, and reactive to light.  Neck:     Thyroid: No thyromegaly.     Vascular: No JVD.  Cardiovascular:     Rate and Rhythm: Normal rate and regular rhythm.     Pulses: Normal pulses.          Dorsalis pedis pulses are 2+ on the right side and 2+ on the left side.       Posterior tibial pulses are 2+ on the right side and 2+ on the left side.     Heart sounds: Normal heart sounds. No murmur. No friction rub. No gallop.   Pulmonary:     Effort: Pulmonary effort is normal. No respiratory distress.     Breath sounds: Normal breath sounds. No stridor. No wheezing, rhonchi or rales.  Chest:     Chest wall: No tenderness.  Abdominal:     General: Bowel sounds are normal.     Palpations: Abdomen is soft. There is no mass.     Tenderness: There is no abdominal tenderness. There is no guarding.  Musculoskeletal:        General: Normal range of motion.     Cervical back: Normal range of motion and neck supple.     Right foot: No bunion.     Left foot: No bunion.  Feet:     Right foot:     Protective Sensation: 10 sites tested. 10 sites sensed.     Skin integrity: Skin integrity normal. No ulcer, blister, skin breakdown, erythema, warmth, callus, dry skin or fissure.     Toenail Condition: Right toenails are normal.     Left foot:     Protective Sensation: 10 sites tested. 10 sites sensed.     Skin integrity: Skin integrity normal. No ulcer, blister, skin breakdown, erythema, warmth, callus, dry skin or fissure.     Toenail Condition: Left toenails are normal.  Lymphadenopathy:     Cervical: No cervical adenopathy.  Skin:    General: Skin is  warm and dry.  Neurological:     Mental Status: She is alert.     Deep Tendon Reflexes: Reflexes are normal and symmetric.     Wt Readings from Last 3 Encounters:  10/22/19 212 lb (96.2 kg)  06/29/19 211 lb (95.7 kg)  06/16/19 210 lb (95.3 kg)    BP 138/78   Pulse 64   Ht 5\' 8"  (1.727 m)   Wt 212 lb (96.2 kg)   BMI 32.23 kg/m   Assessment and Plan:  1. Essential hypertension .  Chronic.  Controlled.  Stable.  Continue losartan hydrochlorothiazide 100-25 mg once a day.  As well we will  continue amlodipine 2.5 mg once a day.  Will check CMP for evaluation of GFR and creatinine BUN. - losartan-hydrochlorothiazide (HYZAAR) 100-25 MG tablet; TAKE (1) TABLET BY MOUTH EVERY DAY AT 6 IN THE MORNING  Dispense: 90 tablet; Refill: 1 - amLODipine (NORVASC) 2.5 MG tablet; Take 1 tablet (2.5 mg total) by mouth daily.  Dispense: 90 tablet; Refill: 1 - Comprehensive metabolic panel  2. GERD without esophagitis Chronic.  Controlled.  Stable.  Uncomplicated.  Continue episodic 20 mg once a day. - omeprazole (PRILOSEC) 20 MG capsule; TAKE ONE CAPSULE EACH MORNING AT THE SAME TIME  Dispense: 90 capsule; Refill: 1  3. Hyperlipidemia, unspecified hyperlipidemia type Chronic.  Controlled.  Stable.  Continue atorvastatin 10 mg once a day.  Will check lipid panel for evaluation of current levels of LDL and triglycerides. - atorvastatin (LIPITOR) 10 MG tablet; 1 daily  Dispense: 90 tablet; Refill: 1 - Lipid Panel With LDL/HDL Ratio  4. Type 2 diabetes mellitus without complication, without long-term current use of insulin ([facility]) Chronic.  Controlled.  Stable.  Continue regimen of glipizide 2.5 mg XL once a day.  And Metformin 500 mg 1 tab twice a day.  Will check microalbuminuria, A1c, CMP.  We will recheck patient in 4 months. - glipiZIDE (GLUCOTROL XL) 2.5 MG 24 hr tablet; TAKE ONE TABLET BY MOUTH DAILY AT THE SAME TIME EVERY MORNING  Dispense: 90 tablet; Refill: 1 - metFORMIN (GLUCOPHAGE) 500 MG  tablet; Take 1 tab BID  Dispense: 180 tablet; Refill: 1 - Microalbumin, urine - Hemoglobin A1c - Comprehensive metabolic panel - Lipid Panel With LDL/HDL Ratio

## 2019-10-23 LAB — COMPREHENSIVE METABOLIC PANEL
ALT: 11 IU/L (ref 0–32)
AST: 13 IU/L (ref 0–40)
Albumin/Globulin Ratio: 1.6 (ref 1.2–2.2)
Albumin: 4.3 g/dL (ref 3.7–4.7)
Alkaline Phosphatase: 90 IU/L (ref 39–117)
BUN/Creatinine Ratio: 16 (ref 12–28)
BUN: 14 mg/dL (ref 8–27)
Bilirubin Total: 0.5 mg/dL (ref 0.0–1.2)
CO2: 25 mmol/L (ref 20–29)
Calcium: 9.7 mg/dL (ref 8.7–10.3)
Chloride: 104 mmol/L (ref 96–106)
Creatinine, Ser: 0.87 mg/dL (ref 0.57–1.00)
GFR calc Af Amer: 77 mL/min/{1.73_m2} (ref >59)
GFR calc non Af Amer: 67 mL/min/{1.73_m2} (ref >59)
Globulin, Total: 2.7 g/dL (ref 1.5–4.5)
Glucose: 95 mg/dL (ref 65–99)
Potassium: 4.8 mmol/L (ref 3.5–5.2)
Sodium: 145 mmol/L — ABNORMAL HIGH (ref 134–144)
Total Protein: 7 g/dL (ref 6.0–8.5)

## 2019-10-23 LAB — LIPID PANEL WITH LDL/HDL RATIO
Cholesterol, Total: 152 mg/dL (ref 100–199)
HDL: 49 mg/dL (ref >39)
LDL Chol Calc (NIH): 78 mg/dL (ref 0–99)
LDL/HDL Ratio: 1.6 ratio (ref 0.0–3.2)
Triglycerides: 142 mg/dL (ref 0–149)
VLDL Cholesterol Cal: 25 mg/dL (ref 5–40)

## 2019-10-23 LAB — MICROALBUMIN, URINE: Microalbumin, Urine: <3 ug/mL

## 2019-10-23 LAB — HEMOGLOBIN A1C
Est. average glucose Bld gHb Est-mCnc: 123 mg/dL
Hgb A1c MFr Bld: 5.9 % — ABNORMAL HIGH (ref 4.8–5.6)

## 2019-10-25 DIAGNOSIS — Z01812 Encounter for preprocedural laboratory examination: Secondary | ICD-10-CM | POA: Diagnosis not present

## 2019-10-28 DIAGNOSIS — R933 Abnormal findings on diagnostic imaging of other parts of digestive tract: Secondary | ICD-10-CM | POA: Diagnosis not present

## 2019-10-28 DIAGNOSIS — K5730 Diverticulosis of large intestine without perforation or abscess without bleeding: Secondary | ICD-10-CM | POA: Diagnosis not present

## 2019-10-28 DIAGNOSIS — M1990 Unspecified osteoarthritis, unspecified site: Secondary | ICD-10-CM | POA: Diagnosis not present

## 2019-10-28 DIAGNOSIS — E785 Hyperlipidemia, unspecified: Secondary | ICD-10-CM | POA: Diagnosis not present

## 2019-10-28 DIAGNOSIS — E119 Type 2 diabetes mellitus without complications: Secondary | ICD-10-CM | POA: Diagnosis not present

## 2019-10-28 DIAGNOSIS — I10 Essential (primary) hypertension: Secondary | ICD-10-CM | POA: Diagnosis not present

## 2019-10-28 DIAGNOSIS — Z79899 Other long term (current) drug therapy: Secondary | ICD-10-CM | POA: Diagnosis not present

## 2019-10-28 DIAGNOSIS — K56699 Other intestinal obstruction unspecified as to partial versus complete obstruction: Secondary | ICD-10-CM | POA: Diagnosis not present

## 2019-10-28 DIAGNOSIS — Z7984 Long term (current) use of oral hypoglycemic drugs: Secondary | ICD-10-CM | POA: Diagnosis not present

## 2019-10-28 DIAGNOSIS — K219 Gastro-esophageal reflux disease without esophagitis: Secondary | ICD-10-CM | POA: Diagnosis not present

## 2019-10-28 DIAGNOSIS — Z9049 Acquired absence of other specified parts of digestive tract: Secondary | ICD-10-CM | POA: Diagnosis not present

## 2019-11-02 ENCOUNTER — Other Ambulatory Visit: Payer: Self-pay

## 2019-12-07 ENCOUNTER — Other Ambulatory Visit: Payer: Self-pay | Admitting: Family Medicine

## 2019-12-07 DIAGNOSIS — Z1231 Encounter for screening mammogram for malignant neoplasm of breast: Secondary | ICD-10-CM

## 2019-12-29 ENCOUNTER — Other Ambulatory Visit: Payer: Self-pay

## 2019-12-29 ENCOUNTER — Telehealth: Payer: Self-pay | Admitting: Family Medicine

## 2019-12-29 DIAGNOSIS — M5432 Sciatica, left side: Secondary | ICD-10-CM

## 2019-12-29 MED ORDER — MELOXICAM 7.5 MG PO TABS: 7.5000 mg | ORAL_TABLET | Freq: Every day | ORAL | 0 refills | Status: DC

## 2019-12-29 MED ORDER — PREDNISONE 10 MG PO TABS: 10.0000 mg | ORAL_TABLET | Freq: Every day | ORAL | 0 refills | Status: DC

## 2019-12-29 NOTE — Telephone Encounter (Signed)
Copied from [place] [id number]. Topic: General - Inquiry >> Dec 29, 2019  1:33 PM Frances Harmon wrote: Reason for CRM: Pt is calling for [facility]. Pt has a question about back pain relief. She wanted to take a tablet that she read about and wanted to if that's okay or would she need to come instead . Please advise

## 2019-12-29 NOTE — Progress Notes (Unsigned)
Sent in meloxicam and prednisone

## 2019-12-29 NOTE — Telephone Encounter (Signed)
Spoke to pt- sent in meloxicam and pred. Will follow up on Friday

## 2020-01-01 ENCOUNTER — Other Ambulatory Visit: Payer: Self-pay

## 2020-01-01 ENCOUNTER — Ambulatory Visit (INDEPENDENT_AMBULATORY_CARE_PROVIDER_SITE_OTHER): Payer: Medicare Other | Admitting: Family Medicine

## 2020-01-01 ENCOUNTER — Encounter: Payer: Self-pay | Admitting: Family Medicine

## 2020-01-01 VITALS — BP 130/80 | HR 80 | Ht 68.0 in | Wt 211.0 lb

## 2020-01-01 DIAGNOSIS — M5432 Sciatica, left side: Secondary | ICD-10-CM | POA: Diagnosis not present

## 2020-01-01 MED ORDER — CYCLOBENZAPRINE HCL 5 MG PO TABS: 5.0000 mg | ORAL_TABLET | Freq: Three times a day (TID) | ORAL | 1 refills | Status: DC | PRN

## 2020-01-01 MED ORDER — MELOXICAM 7.5 MG PO TABS: 7.5000 mg | ORAL_TABLET | Freq: Every day | ORAL | 1 refills | Status: DC

## 2020-01-01 MED ORDER — PREDNISONE 10 MG PO TABS: 10.0000 mg | ORAL_TABLET | Freq: Every day | ORAL | 0 refills | Status: DC

## 2020-01-01 NOTE — Progress Notes (Signed)
Date:  01/01/2020   Name:  Frances Harmon   DOB:  08-19-1945   MRN:  [id number]   Chief Complaint: Sciatica (pain in L) buttock radiating down L) leg- started prednisone x 2 days and meloxicam- both have helped)  Back Pain This is a chronic problem. The current episode started 1 to 4 weeks ago. The problem has been gradually improving since onset. The pain is present in the lumbar spine. The quality of the pain is described as aching. The pain is moderate. The symptoms are aggravated by stress. Pertinent negatives include no abdominal pain, bladder incontinence, bowel incontinence, chest pain, dysuria, fever, headaches, numbness, paresis, paresthesias, pelvic pain or weakness. She has tried NSAIDs (prednisone) for the symptoms. The treatment provided moderate relief.    Lab Results  Component Value Date   CREATININE 0.87 10/22/2019   BUN 14 10/22/2019   NA 145 (H) 10/22/2019   K 4.8 10/22/2019   CL 104 10/22/2019   CO2 25 10/22/2019   Lab Results  Component Value Date   CHOL 152 10/22/2019   HDL 49 10/22/2019   LDLCALC 78 10/22/2019   TRIG 142 10/22/2019   CHOLHDL 2.4 09/12/2017   No results found for: TSH Lab Results  Component Value Date   HGBA1C 5.9 (H) 10/22/2019   Lab Results  Component Value Date   WBC 7.4 04/23/2019   HGB 13.5 04/23/2019   HCT 39.4 04/23/2019   MCV 85 04/23/2019   PLT 139 (L) 04/23/2019   Lab Results  Component Value Date   ALT 11 10/22/2019   AST 13 10/22/2019   ALKPHOS 90 10/22/2019   BILITOT 0.5 10/22/2019     Review of Systems  Constitutional: Negative.  Negative for chills, fatigue, fever and unexpected weight change.  HENT: Negative for congestion, ear discharge, ear pain, postnasal drip, rhinorrhea, sinus pressure, sneezing and sore throat.   Eyes: Negative for photophobia, pain, discharge, redness and itching.  Respiratory: Negative for cough, shortness of breath, wheezing and stridor.   Cardiovascular: Negative for chest  pain, palpitations and leg swelling.  Gastrointestinal: Negative for abdominal pain, blood in stool, bowel incontinence, constipation, diarrhea, nausea and vomiting.  Endocrine: Negative for cold intolerance, heat intolerance, polydipsia, polyphagia and polyuria.  Genitourinary: Negative for bladder incontinence, dysuria, flank pain, frequency, hematuria, menstrual problem, pelvic pain, urgency, vaginal bleeding and vaginal discharge.  Musculoskeletal: Positive for back pain. Negative for arthralgias and myalgias.  Skin: Negative for rash.  Allergic/Immunologic: Negative for environmental allergies and food allergies.  Neurological: Negative for dizziness, weakness, light-headedness, numbness, headaches and paresthesias.  Hematological: Negative for adenopathy. Does not bruise/bleed easily.  Psychiatric/Behavioral: Negative for dysphoric mood. The patient is not nervous/anxious.     Patient Active Problem List   Diagnosis Date Noted  . Thrombocytopenia ([place]) 04/23/2019  . Type 2 diabetes mellitus with complication, without long-term current use of insulin ([place]) 04/28/2015  . Essential hypertension 04/28/2015  . Hyperlipidemia 04/28/2015  . Esophageal reflux 04/28/2015  . Vitamin D deficiency 04/28/2015    No Known Allergies  Past Surgical History:  Procedure Laterality Date  . COLONOSCOPY  2009   repeat in 5 years- Dr Bary Leriche  . COLONOSCOPY WITH PROPOFOL N/A 09/09/2015   Procedure: COLONOSCOPY WITH PROPOFOL;  Surgeon: Hulen Luster, MD;  Location[facility] ENDOSCOPY;  Service: Gastroenterology;  Laterality: N/A;  . FOOT SURGERY Bilateral    Bunion removal  . INCONTINENCE SURGERY    . VAGINAL HYSTERECTOMY    . VENTRAL HERNIA REPAIR  N/A 05/06/2015   Procedure: HERNIA REPAIR VENTRAL ADULT;  Surgeon: Leonie Green, MD;  Location: [facility] ORS;  Service: General;  Laterality: N/A;    Social History   Tobacco Use  . Smoking status: Never Smoker  . Smokeless tobacco: Never Used  .  Tobacco comment: smokin cessation materials not required  Vaping Use  . Vaping Use: Never used  Substance Use Topics  . Alcohol use: No    Alcohol/week: 0.0 standard drinks  . Drug use: No     Medication list has been reviewed and updated.  Current Meds  Medication Sig  . amLODipine (NORVASC) 2.5 MG tablet Take 1 tablet (2.5 mg total) by mouth daily.  Marland Kitchen atorvastatin (LIPITOR) 10 MG tablet 1 daily  . glipiZIDE (GLUCOTROL XL) 2.5 MG 24 hr tablet TAKE ONE TABLET BY MOUTH DAILY AT THE SAME TIME EVERY MORNING  . losartan-hydrochlorothiazide (HYZAAR) 100-25 MG tablet TAKE (1) TABLET BY MOUTH EVERY DAY AT 6 IN THE MORNING  . meloxicam (MOBIC) 7.5 MG tablet Take 1 tablet (7.5 mg total) by mouth daily.  . metFORMIN (GLUCOPHAGE) 500 MG tablet Take 1 tab BID  . omeprazole (PRILOSEC) 20 MG capsule TAKE ONE CAPSULE EACH MORNING AT THE SAME TIME  . predniSONE (DELTASONE) 10 MG tablet Take 1 tablet (10 mg total) by mouth daily with breakfast.    PHQ 2/9 Scores 01/01/2020 10/22/2019 04/23/2019 03/02/2019  PHQ - 2 Score 0 0 0 0  PHQ- 9 Score 0 0 0 -    BP Readings from Last 3 Encounters:  01/01/20 130/80  10/22/19 138/78  06/29/19 (!) 142/80    Physical Exam Vitals and nursing note reviewed.  Constitutional:      Appearance: She is well-developed.  HENT:     Head: Normocephalic.     Right Ear: External ear normal.     Left Ear: External ear normal.  Eyes:     General: Lids are everted, no foreign bodies appreciated. No scleral icterus.       Left eye: No foreign body or hordeolum.     Conjunctiva/sclera: Conjunctivae normal.     Right eye: Right conjunctiva is not injected.     Left eye: Left conjunctiva is not injected.     Pupils: Pupils are equal, round, and reactive to light.  Neck:     Thyroid: No thyromegaly.     Vascular: No JVD.     Trachea: No tracheal deviation.  Cardiovascular:     Rate and Rhythm: Normal rate and regular rhythm.     Heart sounds: Normal heart sounds. No  murmur heard.  No friction rub. No gallop.   Pulmonary:     Effort: Pulmonary effort is normal. No respiratory distress.     Breath sounds: Normal breath sounds. No rhonchi or rales.  Abdominal:     General: Bowel sounds are normal.     Palpations: Abdomen is soft. There is no mass.     Tenderness: There is no abdominal tenderness. There is no guarding or rebound.  Musculoskeletal:        General: No tenderness. Normal range of motion.     Cervical back: Normal range of motion and neck supple.     Lumbar back: Spasms present. No tenderness. Negative right straight leg raise test and negative left straight leg raise test. No scoliosis.  Lymphadenopathy:     Cervical: No cervical adenopathy.  Skin:    General: Skin is warm.     Findings: No rash.  Neurological:     Mental Status: She is alert and oriented to person, place, and time.     Cranial Nerves: No cranial nerve deficit.     Deep Tendon Reflexes: Reflexes normal.  Psychiatric:        Mood and Affect: Mood is not anxious or depressed.     Wt Readings from Last 3 Encounters:  01/01/20 211 lb (95.7 kg)  10/22/19 212 lb (96.2 kg)  06/29/19 211 lb (95.7 kg)    BP 130/80   Pulse 80   Ht 5\' 8"  (1.727 m)   Wt 211 lb (95.7 kg)   BMI 32.08 kg/m   Assessment and Plan:  1. Sciatica of left side New onset.  Improving.  Stable.  Patient was initiated on prednisone and meloxicam prior to today's visit by 51. patient says that combination is improving the symptoms.  Exam is encouraging that there is no SLE with mild spasm paraspinal muscles.  In addition to continuance of the meloxicam and prednisone we will give her a low dose of Cyclovir benzopyrene 5 mg nightly for muscle relaxation.  Patient may continue to see her chiropractor that I think is helping with her symptoms as well. - meloxicam (MOBIC) 7.5 MG tablet; Take 1 tablet (7.5 mg total) by mouth daily.  Dispense: 30 tablet; Refill: 1 - predniSONE (DELTASONE) 10 MG tablet;  Take 1 tablet (10 mg total) by mouth daily with breakfast.  Dispense: 14 tablet; Refill: 0 - cyclobenzaprine (FLEXERIL) 5 MG tablet; Take 1 tablet (5 mg total) by mouth 3 (three) times daily as needed for muscle spasms.  Dispense: 30 tablet; Refill: 1

## 2020-01-05 DIAGNOSIS — E119 Type 2 diabetes mellitus without complications: Secondary | ICD-10-CM | POA: Diagnosis not present

## 2020-01-05 DIAGNOSIS — E782 Mixed hyperlipidemia: Secondary | ICD-10-CM | POA: Diagnosis not present

## 2020-01-05 DIAGNOSIS — I10 Essential (primary) hypertension: Secondary | ICD-10-CM | POA: Diagnosis not present

## 2020-01-11 ENCOUNTER — Other Ambulatory Visit: Payer: Self-pay

## 2020-01-11 ENCOUNTER — Ambulatory Visit
Admission: RE | Admit: 2020-01-11 | Discharge: 2020-01-11 | Disposition: A | Discharge status: Home / Self Care | Payer: Medicare Other | Source: Ambulatory Visit | Attending: Family Medicine | Admitting: Family Medicine

## 2020-01-11 DIAGNOSIS — Z1231 Encounter for screening mammogram for malignant neoplasm of breast: Secondary | ICD-10-CM | POA: Diagnosis not present

## 2020-03-02 ENCOUNTER — Ambulatory Visit (INDEPENDENT_AMBULATORY_CARE_PROVIDER_SITE_OTHER): Payer: Medicare Other

## 2020-03-02 DIAGNOSIS — Z780 Asymptomatic menopausal state: Secondary | ICD-10-CM

## 2020-03-02 DIAGNOSIS — Z0000 Encounter for general adult medical examination without abnormal findings: Secondary | ICD-10-CM

## 2020-03-02 NOTE — Progress Notes (Signed)
Subjective:   Frances Harmon is a 74 y.o. female who presents for Medicare Annual (Subsequent) preventive examination.  Virtual Visit via Telephone Note  I connected with  Frances Harmon on 03/02/20 at  2:00 PM EDT by telephone and verified that I am speaking with the correct person using two identifiers.  Medicare Annual Wellness visit completed telephonically due to Covid-19 pandemic.   Location: Patient: home Provider: [facility]   I discussed the limitations, risks, security and privacy concerns of performing an evaluation and management service by telephone and the availability of in person appointments. The patient expressed understanding and agreed to proceed.  Unable to perform video visit due to video visit attempted and failed and/or patient does not have video capability.   Some vital signs may be absent or patient reported.   Clemetine Marker, LPN    Review of Systems           Objective:    There were no vitals filed for this visit. There is no height or weight on file to calculate BMI.  Advanced Directives 03/02/2020 03/02/2019 02/26/2018 01/21/2017 05/06/2015 04/28/2015 03/07/2015  Does Patient Have a Medical Advance Directive? Yes No Yes Yes Yes Yes Yes  Type of Paramedic of [place];Living will - [place];Living will [place];Living will [place];Living will Living will Living will  Does patient want to make changes to medical advance directive? - - - - No - Patient declined No - Patient declined -  Copy of [place] in Chart? No - copy requested - No - copy requested No - copy requested No - copy requested - No - copy requested  Would patient like information on creating a medical advance directive? - Yes (MAU/Ambulatory/Procedural Areas - Information given) - - - - -    Current Medications (verified) Outpatient Encounter Medications as of 03/02/2020  Medication  Sig  . amLODipine (NORVASC) 2.5 MG tablet Take 1 tablet (2.5 mg total) by mouth daily.  Marland Kitchen atorvastatin (LIPITOR) 10 MG tablet 1 daily  . cyclobenzaprine (FLEXERIL) 5 MG tablet Take 1 tablet (5 mg total) by mouth 3 (three) times daily as needed for muscle spasms.  Marland Kitchen glipiZIDE (GLUCOTROL XL) 2.5 MG 24 hr tablet TAKE ONE TABLET BY MOUTH DAILY AT THE SAME TIME EVERY MORNING  . losartan-hydrochlorothiazide (HYZAAR) 100-25 MG tablet TAKE (1) TABLET BY MOUTH EVERY DAY AT 6 IN THE MORNING  . metFORMIN (GLUCOPHAGE) 500 MG tablet Take 1 tab BID  . omeprazole (PRILOSEC) 20 MG capsule TAKE ONE CAPSULE EACH MORNING AT THE SAME TIME  . meloxicam (MOBIC) 7.5 MG tablet Take 1 tablet (7.5 mg total) by mouth daily. (Patient not taking: Reported on 03/02/2020)  . [DISCONTINUED] predniSONE (DELTASONE) 10 MG tablet Take 1 tablet (10 mg total) by mouth daily with breakfast.   No facility-administered encounter medications on file as of 03/02/2020.    Allergies (verified) Patient has no known allergies.   History: Past Medical History:  Diagnosis Date  . Diabetes mellitus without complication ([place])   . GERD (gastroesophageal reflux disease)   . Hyperlipidemia   . Hypertension    Past Surgical History:  Procedure Laterality Date  . COLONOSCOPY  2009   repeat in 5 years- Dr Bary Leriche  . COLONOSCOPY WITH PROPOFOL N/A 09/09/2015   Procedure: COLONOSCOPY WITH PROPOFOL;  Surgeon: Hulen Luster, MD;  Location: [facility] ENDOSCOPY;  Service: Gastroenterology;  Laterality: N/A;  . FOOT SURGERY Bilateral  Bunion removal  . INCONTINENCE SURGERY    . VAGINAL HYSTERECTOMY    . VENTRAL HERNIA REPAIR N/A 05/06/2015   Procedure: HERNIA REPAIR VENTRAL ADULT;  Surgeon: Leonie Green, MD;  Location: [facility] ORS;  Service: General;  Laterality: N/A;   Family History  Problem Relation Age of Onset  . Breast cancer Daughter 29  . Breast cancer Mother 31  . Hypertension Brother   . Diabetes Brother   . Arthritis Brother     Social History   Socioeconomic History  . Marital status: Married    Spouse name: Not on file  . Number of children: 2  . Years of education: Not on file  . Highest education level: 12th grade  Occupational History  . Occupation: Retired    Comment: works part time  Tobacco Use  . Smoking status: Never Smoker  . Smokeless tobacco: Never Used  . Tobacco comment: smokin cessation materials not required  Vaping Use  . Vaping Use: Never used  Substance and Sexual Activity  . Alcohol use: No    Alcohol/week: 0.0 standard drinks  . Drug use: No  . Sexual activity: Never  Other Topics Concern  . Not on file  Social History Narrative  . Not on file   Social Determinants of Health   Financial Resource Strain: Low Risk   . Difficulty of Paying Living Expenses: Not hard at all  Food Insecurity: No Food Insecurity  . Worried About Charity fundraiser in the Last Year: Never true  . Ran Out of Food in the Last Year: Never true  Transportation Needs: No Transportation Needs  . Lack of Transportation (Medical): No  . Lack of Transportation (Non-Medical): No  Physical Activity: Insufficiently Active  . Days of Exercise per Week: 2 days  . Minutes of Exercise per Session: 30 min  Stress: No Stress Concern Present  . Feeling of Stress : Not at all  Social Connections: Unknown  . Frequency of Communication with Friends and Family: Patient refused  . Frequency of Social Gatherings with Friends and Family: Patient refused  . Attends Religious Services: Patient refused  . Active Member of Clubs or Organizations: Patient refused  . Attends Archivist Meetings: Patient refused  . Marital Status: Married    Tobacco Counseling Counseling given: Not Answered Comment: smokin cessation materials not required   Clinical Intake:  Pre-visit preparation completed: Yes  Pain : No/denies pain     Nutritional Risks: None Diabetes: Yes CBG done?: No Did pt. bring in CBG  monitor from home?: No  How often do you need to have someone help you when you read instructions, pamphlets, or other written materials from your doctor or pharmacy?: 1 - Never  Nutrition Risk Assessment:  Has the patient had any N/V/D within the last 2 months?  No  Does the patient have any non-healing wounds?  No  Has the patient had any unintentional weight loss or weight gain?  No   Diabetes:  Is the patient diabetic?  Yes  If diabetic, was a CBG obtained today?  No  Did the patient bring in their glucometer from home?  No  How often do you monitor your CBG's? Pt does not check blood sugar.   Financial Strains and Diabetes Management:  Are you having any financial strains with the device, your supplies or your medication? No .  Does the patient want to be seen by Chronic Care Management for management of their diabetes?  No  Would the patient like to be referred to a Nutritionist or for Diabetic Management?  No   Diabetic Exams:  Diabetic Eye Exam: Completed 04/23/19.   Diabetic Foot Exam: Completed 10/22/19.   Interpreter Needed?: No  Information entered by :: Clemetine Marker LPN   Activities of Daily Living No flowsheet data found.  Patient Care Team: Juline Patch, MD as PCP - General (Family Medicine)  Indicate any recent Medical Services you may have received from other than [facility] providers in the past year (date may be approximate).     Assessment:   This is a routine wellness examination for Frances Harmon.  Hearing/Vision screen  Hearing Screening   125Hz  250Hz  500Hz  1000Hz  2000Hz  3000Hz  4000Hz  6000Hz  8000Hz   Right ear:           Left ear:           Comments: Pt denies hearing difficulty   Vision Screening Comments: Annual vision screenings at [place] issues and exercise activities discussed:    Goals    . DIET - INCREASE WATER INTAKE     Recommend to drink at least 6-8 8oz glasses of water per day.    . Weight (lb) < 200 lb (90.7 kg)       Pt states she would like to lose 10-15 lbs over the next year.       Depression Screen PHQ 2/9 Scores 03/02/2020 01/01/2020 10/22/2019 04/23/2019 03/02/2019 10/17/2018 05/27/2018  PHQ - 2 Score 0 0 0 0 0 0 0  PHQ- 9 Score - 0 0 0 - 0 0    Fall Risk Fall Risk  03/02/2020 01/01/2020 10/22/2019 03/02/2019 05/27/2018  Falls in the past year? 0 0 0 1 1  Number falls in past yr: 0 - - 1 0  Comment - - - - -  Injury with Fall? 0 - - 0 0  Comment - - - - -  Risk for fall due to : No Fall Risks - - - -  Risk for fall due to: Comment - - - - -  Follow up Falls prevention discussed Falls evaluation completed Falls evaluation completed Falls prevention discussed Falls evaluation completed    Any stairs in or around the home? Yes  If so, are there any without handrails? No  1 step to garage Home free of loose throw rugs in walkways, pet beds, electrical cords, etc? Yes  Adequate lighting in your home to reduce risk of falls? Yes   ASSISTIVE DEVICES UTILIZED TO PREVENT FALLS:  Life alert? No  Use of a cane, walker or w/c? No  Grab bars in the bathroom? Yes  Shower chair or bench in shower? Yes  Elevated toilet seat or a handicapped toilet? Yes   TIMED UP AND GO:  Was the test performed? No . Telephonic visit.   Cognitive Function:     6CIT Screen 03/02/2020 03/02/2019 02/26/2018 01/21/2017  What Year? 0 points 0 points 0 points 0 points  What month? 0 points 0 points 0 points 0 points  What time? 0 points 0 points 0 points 0 points  Count back from 20 0 points 0 points 0 points 0 points  Months in reverse 0 points 2 points 0 points 0 points  Repeat phrase 0 points 0 points 0 points 0 points  Total Score 0 2 0 0    Immunizations Immunization History  Administered Date(s) Administered  . Fluad Quad(high Dose 65+) 06/16/2019  . Influenza, High Dose Seasonal  PF 03/19/2017, 04/09/2018  . Influenza,inj,Quad PF,6+ Mos 04/28/2015  . Moderna SARS-COVID-2 Vaccination 09/07/2019, 09/28/2019  .  Pneumococcal Polysaccharide-23 04/10/2013    TDAP status: Due, Education has been provided regarding the importance of this vaccine. Advised may receive this vaccine at local pharmacy or Health Dept. Aware to provide a copy of the vaccination record if obtained from local pharmacy or Health Dept. Verbalized acceptance and understanding.   Flu Vaccine status: Up to date   Pneumococcal vaccine status: Up to date   Covid-19 vaccine status: Completed vaccines  Qualifies for Shingles Vaccine? Yes   Zostavax completed No   Shingrix Completed?: No.    Education has been provided regarding the importance of this vaccine. Patient has been advised to call insurance company to determine out of pocket expense if they have not yet received this vaccine. Advised may also receive vaccine at local pharmacy or Health Dept. Verbalized acceptance and understanding.  Screening Tests Health Maintenance  Topic Date Due  . INFLUENZA VACCINE  02/21/2020  . HEMOGLOBIN A1C  04/22/2020  . OPHTHALMOLOGY EXAM  04/22/2020  . FOOT EXAM  10/21/2020  . MAMMOGRAM  01/10/2021  . TETANUS/TDAP  12/05/2026  . COLONOSCOPY  10/27/2029  . DEXA SCAN  Completed  . COVID-19 Vaccine  Completed  . Hepatitis C Screening  Completed  . PNA vac Low Risk Adult  Completed    Health Maintenance  Health Maintenance Due  Topic Date Due  . INFLUENZA VACCINE  02/21/2020    Colorectal cancer screening: Completed 10/28/19. Repeat every 10 years   Mammogram status: Completed 01/11/20. Repeat every year   Bone Density status: Completed 03/10/18. Results reflect: Bone density results: OSTEOPENIA. Repeat every 2 years.  Lung Cancer Screening: (Low Dose CT Chest recommended if Age 57-80 years, 30 pack-year currently smoking OR have quit w/in 15years.) does not qualify.   Additional Screening:  Hepatitis C Screening: does qualify; Completed 01/21/17  Vision Screening: Recommended annual ophthalmology exams for early detection of  glaucoma and other disorders of the eye. Is the patient up to date with their annual eye exam?  Yes  Who is the provider or what is the name of the office in which the patient attends annual eye exams? Dr. Ellin Mayhew  Dental Screening: Recommended annual dental exams for proper oral hygiene  Community Resource Referral / Chronic Care Management: CRR required this visit?  No   CCM required this visit?  No      Plan:     I have personally reviewed and noted the following in the patient's chart:   . Medical and social history . Use of alcohol, tobacco or illicit drugs  . Current medications and supplements . Functional ability and status . Nutritional status . Physical activity . Advanced directives . List of other physicians . Hospitalizations, surgeries, and ER visits in previous 12 months . Vitals . Screenings to include cognitive, depression, and falls . Referrals and appointments  In addition, I have reviewed and discussed with patient certain preventive protocols, quality metrics, and best practice recommendations. A written personalized care plan for preventive services as well as general preventive health recommendations were provided to patient.     Clemetine Marker, LPN   03/09/2992   Nurse Notes: pt doing well and appreciative of visit today

## 2020-03-02 NOTE — Patient Instructions (Signed)
Frances Harmon , Thank you for taking time to come for your Medicare Wellness Visit. I appreciate your ongoing commitment to your health goals. Please review the following plan we discussed and let me know if I can assist you in the future.   Screening recommendations/referrals: Colonoscopy: done 10/28/19 Mammogram: done 01/11/20 Bone Density: done 03/10/18. Please call [phone] to schedule your bone density screening..  Recommended yearly ophthalmology/optometry visit for glaucoma screening and checkup Recommended yearly dental visit for hygiene and checkup  Vaccinations: Influenza vaccine: done 06/16/19 Pneumococcal vaccine: done 04/10/13 Tdap vaccine: done 04/10/13 Shingles vaccine: Shingrix discussed. Please contact your pharmacy for coverage information.  Covid-19:done 09/07/19 & 09/28/19   Advanced directives: Please bring a copy of your health care power of attorney and living will to the office at your convenience.  Conditions/risks identified: Recommend healthy eating and physical activity for desired weight loss  Next appointment: Follow up in one year for your annual wellness visit    Preventive Care 65 Years and Older, Female Preventive care refers to lifestyle choices and visits with your health care provider that can promote health and wellness. What does preventive care include?  A yearly physical exam. This is also called an annual well check.  Dental exams once or twice a year.  Routine eye exams. Ask your health care provider how often you should have your eyes checked.  Personal lifestyle choices, including:  Daily care of your teeth and gums.  Regular physical activity.  Eating a healthy diet.  Avoiding tobacco and drug use.  Limiting alcohol use.  Practicing safe sex.  Taking low-dose aspirin every day.  Taking vitamin and mineral supplements as recommended by your health care provider. What happens during an annual well check? The services and  screenings done by your health care provider during your annual well check will depend on your age, overall health, lifestyle risk factors, and family history of disease. Counseling  Your health care provider may ask you questions about your:  Alcohol use.  Tobacco use.  Drug use.  Emotional well-being.  Home and relationship well-being.  Sexual activity.  Eating habits.  History of falls.  Memory and ability to understand (cognition).  Work and work Statistician.  Reproductive health. Screening  You may have the following tests or measurements:  Height, weight, and BMI.  Blood pressure.  Lipid and cholesterol levels. These may be checked every 5 years, or more frequently if you are over 70 years old.  Skin check.  Lung cancer screening. You may have this screening every year starting at age 63 if you have a 30-pack-year history of smoking and currently smoke or have quit within the past 15 years.  Fecal occult blood test (FOBT) of the stool. You may have this test every year starting at age 20.  Flexible sigmoidoscopy or colonoscopy. You may have a sigmoidoscopy every 5 years or a colonoscopy every 10 years starting at age 80.  Hepatitis C blood test.  Hepatitis B blood test.  Sexually transmitted disease (STD) testing.  Diabetes screening. This is done by checking your blood sugar (glucose) after you have not eaten for a while (fasting). You may have this done every 1-3 years.  Bone density scan. This is done to screen for osteoporosis. You may have this done starting at age 97.  Mammogram. This may be done every 1-2 years. Talk to your health care provider about how often you should have regular mammograms. Talk with your health care provider about your test  results, treatment options, and if necessary, the need for more tests. Vaccines  Your health care provider may recommend certain vaccines, such as:  Influenza vaccine. This is recommended every  year.  Tetanus, diphtheria, and acellular pertussis (Tdap, Td) vaccine. You may need a Td booster every 10 years.  Zoster vaccine. You may need this after age 63.  Pneumococcal 13-valent conjugate (PCV13) vaccine. One dose is recommended after age 25.  Pneumococcal polysaccharide (PPSV23) vaccine. One dose is recommended after age 45. Talk to your health care provider about which screenings and vaccines you need and how often you need them. This information is not intended to replace advice given to you by your health care provider. Make sure you discuss any questions you have with your health care provider. Document Released: 08/05/2015 Document Revised: 03/28/2016 Document Reviewed: 05/10/2015 Elsevier Interactive Patient Education  2017 [place] Prevention in the Home Falls can cause injuries. They can happen to people of all ages. There are many things you can do to make your home safe and to help prevent falls. What can I do on the outside of my home?  Regularly fix the edges of walkways and driveways and fix any cracks.  Remove anything that might make you trip as you walk through a door, such as a raised step or threshold.  Trim any bushes or trees on the path to your home.  Use bright outdoor lighting.  Clear any walking paths of anything that might make someone trip, such as rocks or tools.  Regularly check to see if handrails are loose or broken. Make sure that both sides of any steps have handrails.  Any raised decks and porches should have guardrails on the edges.  Have any leaves, snow, or ice cleared regularly.  Use sand or salt on walking paths during winter.  Clean up any spills in your garage right away. This includes oil or grease spills. What can I do in the bathroom?  Use night lights.  Install grab bars by the toilet and in the tub and shower. Do not use towel bars as grab bars.  Use non-skid mats or decals in the tub or shower.  If you  need to sit down in the shower, use a plastic, non-slip stool.  Keep the floor dry. Clean up any water that spills on the floor as soon as it happens.  Remove soap buildup in the tub or shower regularly.  Attach bath mats securely with double-sided non-slip rug tape.  Do not have throw rugs and other things on the floor that can make you trip. What can I do in the bedroom?  Use night lights.  Make sure that you have a light by your bed that is easy to reach.  Do not use any sheets or blankets that are too big for your bed. They should not hang down onto the floor.  Have a firm chair that has side arms. You can use this for support while you get dressed.  Do not have throw rugs and other things on the floor that can make you trip. What can I do in the kitchen?  Clean up any spills right away.  Avoid walking on wet floors.  Keep items that you use a lot in easy-to-reach places.  If you need to reach something above you, use a strong step stool that has a grab bar.  Keep electrical cords out of the way.  Do not use floor polish or wax that makes  floors slippery. If you must use wax, use non-skid floor wax.  Do not have throw rugs and other things on the floor that can make you trip. What can I do with my stairs?  Do not leave any items on the stairs.  Make sure that there are handrails on both sides of the stairs and use them. Fix handrails that are broken or loose. Make sure that handrails are as long as the stairways.  Check any carpeting to make sure that it is firmly attached to the stairs. Fix any carpet that is loose or worn.  Avoid having throw rugs at the top or bottom of the stairs. If you do have throw rugs, attach them to the floor with carpet tape.  Make sure that you have a light switch at the top of the stairs and the bottom of the stairs. If you do not have them, ask someone to add them for you. What else can I do to help prevent falls?  Wear shoes  that:  Do not have high heels.  Have rubber bottoms.  Are comfortable and fit you well.  Are closed at the toe. Do not wear sandals.  If you use a stepladder:  Make sure that it is fully opened. Do not climb a closed stepladder.  Make sure that both sides of the stepladder are locked into place.  Ask someone to hold it for you, if possible.  Clearly mark and make sure that you can see:  Any grab bars or handrails.  First and last steps.  Where the edge of each step is.  Use tools that help you move around (mobility aids) if they are needed. These include:  Canes.  Walkers.  Scooters.  Crutches.  Turn on the lights when you go into a dark area. Replace any light bulbs as soon as they burn out.  Set up your furniture so you have a clear path. Avoid moving your furniture around.  If any of your floors are uneven, fix them.  If there are any pets around you, be aware of where they are.  Review your medicines with your doctor. Some medicines can make you feel dizzy. This can increase your chance of falling. Ask your doctor what other things that you can do to help prevent falls. This information is not intended to replace advice given to you by your health care provider. Make sure you discuss any questions you have with your health care provider. Document Released: 05/05/2009 Document Revised: 12/15/2015 Document Reviewed: 08/13/2014 Elsevier Interactive Patient Education  2017 [employer].

## 2020-03-14 ENCOUNTER — Ambulatory Visit
Admission: RE | Admit: 2020-03-14 | Discharge: 2020-03-14 | Disposition: A | Discharge status: Home / Self Care | Payer: Medicare Other | Source: Ambulatory Visit | Attending: Family Medicine | Admitting: Family Medicine

## 2020-03-14 ENCOUNTER — Other Ambulatory Visit: Payer: Self-pay

## 2020-03-14 DIAGNOSIS — Z780 Asymptomatic menopausal state: Secondary | ICD-10-CM | POA: Insufficient documentation

## 2020-03-14 DIAGNOSIS — R29890 Loss of height: Secondary | ICD-10-CM | POA: Diagnosis not present

## 2020-03-14 DIAGNOSIS — M85852 Other specified disorders of bone density and structure, left thigh: Secondary | ICD-10-CM | POA: Diagnosis not present

## 2020-04-04 ENCOUNTER — Telehealth: Payer: Self-pay | Admitting: Family Medicine

## 2020-04-04 ENCOUNTER — Ambulatory Visit (INDEPENDENT_AMBULATORY_CARE_PROVIDER_SITE_OTHER): Payer: Medicare Other | Admitting: Family Medicine

## 2020-04-04 ENCOUNTER — Other Ambulatory Visit: Payer: Self-pay

## 2020-04-04 ENCOUNTER — Encounter: Payer: Self-pay | Admitting: Family Medicine

## 2020-04-04 VITALS — BP 120/70 | HR 84 | Temp 99.0°F | Ht 68.0 in | Wt 212.0 lb

## 2020-04-04 DIAGNOSIS — J0100 Acute maxillary sinusitis, unspecified: Secondary | ICD-10-CM

## 2020-04-04 MED ORDER — AMOXICILLIN-POT CLAVULANATE 875-125 MG PO TABS: 1.0000 | ORAL_TABLET | Freq: Two times a day (BID) | ORAL | 0 refills | Status: DC

## 2020-04-04 NOTE — Telephone Encounter (Signed)
Called and left message to come in at [phone] this afternoon

## 2020-04-04 NOTE — Telephone Encounter (Signed)
Copied from [place] [id number]. Topic: Quick Communication - See Telephone Encounter >> Apr 04, 2020  8:21 AM Loma [place] wrote: CRM for notification. See Telephone encounter for: 04/04/20. Pt called and wanted to speak with Baxter Flattery or Dr Ronnald Ramp having a sinus issue with a stopped up nose and a headache. Did not want to make appt as she thinks that Dr Lenna Sciara will just call her in an antibiotic. She is at work and can't really be on phone and says Dr Lenna Sciara sometimes just text her back?She wants a text back from [place] or Dr Ronnald Ramp. [phone] told pt make get a call back and she will try to get

## 2020-04-04 NOTE — Progress Notes (Signed)
Date:  04/04/2020   Name:  Frances Harmon   DOB:  Sep 06, 1945   MRN:  [id number]   Chief Complaint: Sinusitis (started saturday with sneezing, now has pressure in face, headache, watery eyes, clear production)  Sinusitis This is a chronic problem. The current episode started more than 1 year ago. The problem has been waxing and waning since onset. Maximum temperature: 99. Her pain is at a severity of 1/10. The pain is mild. Associated symptoms include congestion, headaches, sinus pressure and sneezing. Pertinent negatives include no chills, coughing, diaphoresis, ear pain, hoarse voice, neck pain, shortness of breath, sore throat or swollen glands. Past treatments include oral decongestants. The treatment provided mild relief.    Lab Results  Component Value Date   CREATININE 0.87 10/22/2019   BUN 14 10/22/2019   NA 145 (H) 10/22/2019   K 4.8 10/22/2019   CL 104 10/22/2019   CO2 25 10/22/2019   Lab Results  Component Value Date   CHOL 152 10/22/2019   HDL 49 10/22/2019   LDLCALC 78 10/22/2019   TRIG 142 10/22/2019   CHOLHDL 2.4 09/12/2017   No results found for: TSH Lab Results  Component Value Date   HGBA1C 5.9 (H) 10/22/2019   Lab Results  Component Value Date   WBC 7.4 04/23/2019   HGB 13.5 04/23/2019   HCT 39.4 04/23/2019   MCV 85 04/23/2019   PLT 139 (L) 04/23/2019   Lab Results  Component Value Date   ALT 11 10/22/2019   AST 13 10/22/2019   ALKPHOS 90 10/22/2019   BILITOT 0.5 10/22/2019     Review of Systems  Constitutional: Negative.  Negative for chills, diaphoresis, fatigue, fever and unexpected weight change.  HENT: Positive for congestion, sinus pressure and sneezing. Negative for ear discharge, ear pain, hoarse voice, rhinorrhea and sore throat.   Eyes: Negative for photophobia, pain, discharge, redness and itching.  Respiratory: Negative for cough, shortness of breath, wheezing and stridor.   Gastrointestinal: Negative for abdominal pain, blood  in stool, constipation, diarrhea, nausea and vomiting.  Endocrine: Negative for cold intolerance, heat intolerance, polydipsia, polyphagia and polyuria.  Genitourinary: Negative for dysuria, flank pain, frequency, hematuria, menstrual problem, pelvic pain, urgency, vaginal bleeding and vaginal discharge.  Musculoskeletal: Negative for arthralgias, back pain, myalgias and neck pain.  Skin: Negative for rash.  Allergic/Immunologic: Negative for environmental allergies and food allergies.  Neurological: Positive for headaches. Negative for dizziness, weakness, light-headedness and numbness.  Hematological: Negative for adenopathy. Does not bruise/bleed easily.  Psychiatric/Behavioral: Negative for dysphoric mood. The patient is not nervous/anxious.     Patient Active Problem List   Diagnosis Date Noted  . Thrombocytopenia ([place]) 04/23/2019  . Type 2 diabetes mellitus with complication, without long-term current use of insulin ([place]) 04/28/2015  . Essential hypertension 04/28/2015  . Hyperlipidemia 04/28/2015  . Esophageal reflux 04/28/2015  . Vitamin D deficiency 04/28/2015    No Known Allergies  Past Surgical History:  Procedure Laterality Date  . COLONOSCOPY  2009   repeat in 5 years- Dr Bary Leriche  . COLONOSCOPY WITH PROPOFOL N/A 09/09/2015   Procedure: COLONOSCOPY WITH PROPOFOL;  Surgeon: Hulen Luster, MD;  Location: [facility] ENDOSCOPY;  Service: Gastroenterology;  Laterality: N/A;  . FOOT SURGERY Bilateral    Bunion removal  . INCONTINENCE SURGERY    . VAGINAL HYSTERECTOMY    . VENTRAL HERNIA REPAIR N/A 05/06/2015   Procedure: HERNIA REPAIR VENTRAL ADULT;  Surgeon: Leonie Green, MD;  Location: [facility] ORS;  Service: General;  Laterality: N/A;    Social History   Tobacco Use  . Smoking status: Never Smoker  . Smokeless tobacco: Never Used  . Tobacco comment: smokin cessation materials not required  Vaping Use  . Vaping Use: Never used  Substance Use Topics  . Alcohol use: No     Alcohol/week: 0.0 standard drinks  . Drug use: No     Medication list has been reviewed and updated.  Current Meds  Medication Sig  . amLODipine (NORVASC) 2.5 MG tablet Take 1 tablet (2.5 mg total) by mouth daily.  Marland Kitchen atorvastatin (LIPITOR) 10 MG tablet 1 daily  . cyclobenzaprine (FLEXERIL) 5 MG tablet Take 1 tablet (5 mg total) by mouth 3 (three) times daily as needed for muscle spasms.  Marland Kitchen glipiZIDE (GLUCOTROL XL) 2.5 MG 24 hr tablet TAKE ONE TABLET BY MOUTH DAILY AT THE SAME TIME EVERY MORNING  . losartan-hydrochlorothiazide (HYZAAR) 100-25 MG tablet TAKE (1) TABLET BY MOUTH EVERY DAY AT 6 IN THE MORNING  . metFORMIN (GLUCOPHAGE) 500 MG tablet Take 1 tab BID  . omeprazole (PRILOSEC) 20 MG capsule TAKE ONE CAPSULE EACH MORNING AT THE SAME TIME    PHQ 2/9 Scores 04/04/2020 03/02/2020 01/01/2020 10/22/2019  PHQ - 2 Score 0 0 0 0  PHQ- 9 Score 0 - 0 0    GAD 7 : Generalized Anxiety Score 04/04/2020 01/01/2020 10/22/2019  Nervous, Anxious, on Edge 0 0 0  Control/stop worrying 0 0 0  Worry too much - different things 0 0 0  Trouble relaxing 0 0 0  Restless 0 0 0  Easily annoyed or irritable 0 0 0  Afraid - awful might happen 0 0 0  Total GAD 7 Score 0 0 0  Anxiety Difficulty - - Not difficult at all    BP Readings from Last 3 Encounters:  04/04/20 120/70  01/01/20 130/80  10/22/19 138/78    Physical Exam Vitals and nursing note reviewed.  Constitutional:      General: She is not in acute distress.    Appearance: She is not diaphoretic.  HENT:     Head: Normocephalic and atraumatic.     Jaw: There is normal jaw occlusion.     Right Ear: Hearing, tympanic membrane, ear canal and external ear normal.     Left Ear: Hearing, tympanic membrane, ear canal and external ear normal.     Nose: Nose normal.     Right Sinus: No maxillary sinus tenderness or frontal sinus tenderness.     Left Sinus: No maxillary sinus tenderness or frontal sinus tenderness.     Mouth/Throat:     Pharynx:  Oropharynx is clear.  Eyes:     General:        Right eye: No discharge.        Left eye: No discharge.     Conjunctiva/sclera: Conjunctivae normal.     Pupils: Pupils are equal, round, and reactive to light.  Neck:     Thyroid: No thyromegaly.     Vascular: No JVD.  Cardiovascular:     Rate and Rhythm: Normal rate and regular rhythm.     Heart sounds: Normal heart sounds. No murmur heard.  No friction rub. No gallop.   Pulmonary:     Effort: Pulmonary effort is normal.     Breath sounds: Normal breath sounds. No decreased breath sounds, wheezing, rhonchi or rales.  Abdominal:     General: Bowel sounds are normal.     Palpations: Abdomen  is soft. There is no hepatomegaly, splenomegaly or mass.     Tenderness: There is no abdominal tenderness. There is no guarding.  Musculoskeletal:        General: Normal range of motion.     Cervical back: Normal range of motion and neck supple.  Lymphadenopathy:     Cervical: No cervical adenopathy.  Skin:    General: Skin is warm and dry.  Neurological:     Mental Status: She is alert.     Deep Tendon Reflexes: Reflexes are normal and symmetric.     Wt Readings from Last 3 Encounters:  04/04/20 212 lb (96.2 kg)  01/01/20 211 lb (95.7 kg)  10/22/19 212 lb (96.2 kg)    BP 120/70   Pulse 84   Temp 99 F (37.2 C) (Oral)   Ht 5\' 8"  (1.727 m)   Wt 212 lb (96.2 kg)   SpO2 99%   BMI 32.23 kg/m   Assessment and Plan: 1. Acute maxillary sinusitis, recurrence not specified Acute.  Persistent.  Stable.  Exam and history are consistent with an acute maxillary sinusitis.  Will prescribe Augmentin 875 mg twice a day for 10 days.

## 2020-04-06 ENCOUNTER — Telehealth: Payer: Self-pay | Admitting: Family Medicine

## 2020-04-06 NOTE — Telephone Encounter (Signed)
Pt has only had 2 days antibiotics- cont Augmentin as she stated she feels some better, just not good.

## 2020-04-06 NOTE — Telephone Encounter (Unsigned)
Copied from [place] [id number]. Topic: General - Other >> Apr 06, 2020 12:40 PM Rainey Pines A wrote: Patient would like a callback from Baxter Flattery in regards to still not feeling well after her recent appointment and the medication not assisting her. Patient wants to know what to do next. Please advise

## 2020-04-26 ENCOUNTER — Other Ambulatory Visit: Payer: Self-pay | Admitting: Family Medicine

## 2020-04-26 DIAGNOSIS — E119 Type 2 diabetes mellitus without complications: Secondary | ICD-10-CM

## 2020-05-02 ENCOUNTER — Ambulatory Visit (INDEPENDENT_AMBULATORY_CARE_PROVIDER_SITE_OTHER): Payer: Medicare Other | Admitting: Family Medicine

## 2020-05-02 ENCOUNTER — Encounter: Payer: Self-pay | Admitting: Family Medicine

## 2020-05-02 ENCOUNTER — Other Ambulatory Visit: Payer: Self-pay

## 2020-05-02 VITALS — BP 120/70 | HR 78 | Ht 68.0 in | Wt 211.0 lb

## 2020-05-02 DIAGNOSIS — K219 Gastro-esophageal reflux disease without esophagitis: Secondary | ICD-10-CM

## 2020-05-02 DIAGNOSIS — I10 Essential (primary) hypertension: Secondary | ICD-10-CM

## 2020-05-02 DIAGNOSIS — E119 Type 2 diabetes mellitus without complications: Secondary | ICD-10-CM

## 2020-05-02 DIAGNOSIS — N3000 Acute cystitis without hematuria: Secondary | ICD-10-CM

## 2020-05-02 DIAGNOSIS — E785 Hyperlipidemia, unspecified: Secondary | ICD-10-CM | POA: Diagnosis not present

## 2020-05-02 LAB — POCT URINALYSIS DIPSTICK
Bilirubin, UA: NEGATIVE
Glucose, UA: NEGATIVE
Protein, UA: POSITIVE — AB
Spec Grav, UA: 1.025 (ref 1.010–1.025)
Urobilinogen, UA: 0.2 E.U./dL
pH, UA: 8 (ref 5.0–8.0)

## 2020-05-02 MED ORDER — AMLODIPINE BESYLATE 2.5 MG PO TABS: 2.5000 mg | ORAL_TABLET | Freq: Every day | ORAL | 1 refills | Status: DC

## 2020-05-02 MED ORDER — METFORMIN HCL 500 MG PO TABS: ORAL_TABLET | ORAL | 1 refills | Status: DC

## 2020-05-02 MED ORDER — OMEPRAZOLE 20 MG PO CPDR: DELAYED_RELEASE_CAPSULE | ORAL | 1 refills | Status: DC

## 2020-05-02 MED ORDER — LOSARTAN POTASSIUM-HCTZ 100-25 MG PO TABS: ORAL_TABLET | ORAL | 1 refills | Status: DC

## 2020-05-02 MED ORDER — ATORVASTATIN CALCIUM 10 MG PO TABS: ORAL_TABLET | ORAL | 1 refills | Status: DC

## 2020-05-02 MED ORDER — CEPHALEXIN 500 MG PO CAPS: 500.0000 mg | ORAL_CAPSULE | Freq: Three times a day (TID) | ORAL | 0 refills | Status: DC

## 2020-05-02 MED ORDER — GLIPIZIDE ER 2.5 MG PO TB24: ORAL_TABLET | ORAL | 1 refills | Status: DC

## 2020-05-02 NOTE — Progress Notes (Signed)
Date:  05/02/2020   Name:  Frances Harmon   DOB:  [birth year]   MRN:  [id number]   Chief Complaint: Urinary Tract Infection, Hypertension, Hyperlipidemia, Gastroesophageal Reflux, and Diabetes  Urinary Tract Infection  This is a new problem. The current episode started in the past 7 days. The problem has been unchanged. The quality of the pain is described as burning. The pain is mild. There has been no fever. Associated symptoms include frequency and urgency. Pertinent negatives include no chills, discharge, flank pain, hematuria, hesitancy, nausea, sweats or vomiting. Treatments tried: azo. The treatment provided mild relief.  Hypertension This is a chronic problem. The current episode started more than 1 year ago. The problem has been gradually improving since onset. The problem is controlled. Pertinent negatives include no anxiety, blurred vision, chest pain, headaches, malaise/fatigue, neck pain, orthopnea, palpitations, peripheral edema, PND, shortness of breath or sweats. Past treatments include angiotensin blockers and calcium channel blockers. The current treatment provides moderate improvement. There are no compliance problems.  There is no history of angina, kidney disease, CAD/MI, CVA, heart failure, left ventricular hypertrophy, PVD or retinopathy. There is no history of chronic renal disease, a hypertension causing med or renovascular disease.  Hyperlipidemia This is a chronic problem. The current episode started more than 1 year ago. The problem is controlled. Recent lipid tests were reviewed and are normal. She has no history of chronic renal disease, hypothyroidism or obesity. Pertinent negatives include no chest pain, focal sensory loss, focal weakness, leg pain, myalgias or shortness of breath. Current antihyperlipidemic treatment includes statins. The current treatment provides moderate improvement of lipids. There are no compliance problems.   Gastroesophageal Reflux She  complains of heartburn. She reports no abdominal pain, no chest pain, no coughing, no nausea, no sore throat or no wheezing. This is a chronic problem. The problem has been gradually improving. Pertinent negatives include no fatigue. She has tried a PPI for the symptoms. The treatment provided mild relief.  Diabetes She presents for her follow-up diabetic visit. She has type 2 diabetes mellitus. Her disease course has been stable. Pertinent negatives for hypoglycemia include no dizziness, headaches, nervousness/anxiousness or sweats. Pertinent negatives for diabetes include no blurred vision, no chest pain, no fatigue, no polydipsia, no polyphagia, no polyuria and no weakness. Symptoms are stable. Pertinent negatives for diabetic complications include no CVA, PVD or retinopathy.    Lab Results  Component Value Date   CREATININE 0.87 10/22/2019   BUN 14 10/22/2019   NA 145 (H) 10/22/2019   K 4.8 10/22/2019   CL 104 10/22/2019   CO2 25 10/22/2019   Lab Results  Component Value Date   CHOL 152 10/22/2019   HDL 49 10/22/2019   LDLCALC 78 10/22/2019   TRIG 142 10/22/2019   CHOLHDL 2.4 09/12/2017   No results found for: TSH Lab Results  Component Value Date   HGBA1C 5.9 (H) 10/22/2019   Lab Results  Component Value Date   WBC 7.4 04/23/2019   HGB 13.5 04/23/2019   HCT 39.4 04/23/2019   MCV 85 04/23/2019   PLT 139 (L) 04/23/2019   Lab Results  Component Value Date   ALT 11 10/22/2019   AST 13 10/22/2019   ALKPHOS 90 10/22/2019   BILITOT 0.5 10/22/2019     Review of Systems  Constitutional: Negative.  Negative for chills, fatigue, fever, malaise/fatigue and unexpected weight change.  HENT: Negative for congestion, ear discharge, ear pain, rhinorrhea, sinus pressure, sneezing and sore throat.  Eyes: Negative for blurred vision, photophobia, pain, discharge, redness and itching.  Respiratory: Negative for cough, shortness of breath, wheezing and stridor.   Cardiovascular:  Negative for chest pain, palpitations, orthopnea and PND.  Gastrointestinal: Positive for heartburn. Negative for abdominal pain, blood in stool, constipation, diarrhea, nausea and vomiting.  Endocrine: Negative for cold intolerance, heat intolerance, polydipsia, polyphagia and polyuria.  Genitourinary: Positive for frequency and urgency. Negative for dysuria, flank pain, hematuria, hesitancy, menstrual problem, pelvic pain, vaginal bleeding and vaginal discharge.  Musculoskeletal: Negative for arthralgias, back pain, myalgias and neck pain.  Skin: Negative for rash.  Allergic/Immunologic: Negative for environmental allergies and food allergies.  Neurological: Negative for dizziness, focal weakness, weakness, light-headedness, numbness and headaches.  Hematological: Negative for adenopathy. Does not bruise/bleed easily.  Psychiatric/Behavioral: Negative for dysphoric mood. The patient is not nervous/anxious.     Patient Active Problem List   Diagnosis Date Noted  . Thrombocytopenia ([place]) 04/23/2019  . Type 2 diabetes mellitus with complication, without long-term current use of insulin ([place]) 04/28/2015  . Essential hypertension 04/28/2015  . Hyperlipidemia 04/28/2015  . Esophageal reflux 04/28/2015  . Vitamin D deficiency 04/28/2015    No Known Allergies  Past Surgical History:  Procedure Laterality Date  . COLONOSCOPY  2009   repeat in 5 years- Dr Bary Leriche  . COLONOSCOPY WITH PROPOFOL N/A 09/09/2015   Procedure: COLONOSCOPY WITH PROPOFOL;  Surgeon: Hulen Luster, MD;  Location: [facility] ENDOSCOPY;  Service: Gastroenterology;  Laterality: N/A;  . FOOT SURGERY Bilateral    Bunion removal  . INCONTINENCE SURGERY    . VAGINAL HYSTERECTOMY    . VENTRAL HERNIA REPAIR N/A 05/06/2015   Procedure: HERNIA REPAIR VENTRAL ADULT;  Surgeon: Leonie Green, MD;  Location: [facility] ORS;  Service: General;  Laterality: N/A;    Social History   Tobacco Use  . Smoking status: Never Smoker  . Smokeless  tobacco: Never Used  . Tobacco comment: smokin cessation materials not required  Vaping Use  . Vaping Use: Never used  Substance Use Topics  . Alcohol use: No    Alcohol/week: 0.0 standard drinks  . Drug use: No     Medication list has been reviewed and updated.  Current Meds  Medication Sig  . amLODipine (NORVASC) 2.5 MG tablet Take 1 tablet (2.5 mg total) by mouth daily.  Marland Kitchen atorvastatin (LIPITOR) 10 MG tablet 1 daily  . glipiZIDE (GLUCOTROL XL) 2.5 MG 24 hr tablet TAKE (1) TABLET BY MOUTH EVERY DAY AT THE SAME TIME EVERY MORNING  . losartan-hydrochlorothiazide (HYZAAR) 100-25 MG tablet TAKE (1) TABLET BY MOUTH EVERY DAY AT 6 IN THE MORNING  . metFORMIN (GLUCOPHAGE) 500 MG tablet Take 1 tab BID  . omeprazole (PRILOSEC) 20 MG capsule TAKE ONE CAPSULE EACH MORNING AT THE SAME TIME    PHQ 2/9 Scores 05/02/2020 04/04/2020 03/02/2020 01/01/2020  PHQ - 2 Score 0 0 0 0  PHQ- 9 Score 0 0 - 0    GAD 7 : Generalized Anxiety Score 05/02/2020 04/04/2020 01/01/2020 10/22/2019  Nervous, Anxious, on Edge 0 0 0 0  Control/stop worrying 0 0 0 0  Worry too much - different things 0 0 0 0  Trouble relaxing 0 0 0 0  Restless 0 0 0 0  Easily annoyed or irritable 0 0 0 0  Afraid - awful might happen 0 0 0 0  Total GAD 7 Score 0 0 0 0  Anxiety Difficulty - - - Not difficult at all    BP Readings  from Last 3 Encounters:  05/02/20 120/70  04/04/20 120/70  01/01/20 130/80    Physical Exam Vitals and nursing note reviewed.  Constitutional:      Appearance: She is well-developed.  HENT:     Head: Normocephalic.     Right Ear: Tympanic membrane, ear canal and external ear normal.     Left Ear: Tympanic membrane, ear canal and external ear normal.     Nose: Nose normal. No congestion or rhinorrhea.     Mouth/Throat:     Mouth: Mucous membranes are moist.  Eyes:     General: Lids are everted, no foreign bodies appreciated. No scleral icterus.       Left eye: No foreign body or hordeolum.      Conjunctiva/sclera: Conjunctivae normal.     Right eye: Right conjunctiva is not injected.     Left eye: Left conjunctiva is not injected.     Pupils: Pupils are equal, round, and reactive to light.  Neck:     Thyroid: No thyromegaly.     Vascular: No JVD.     Trachea: No tracheal deviation.  Cardiovascular:     Rate and Rhythm: Normal rate and regular rhythm.     Heart sounds: Normal heart sounds. No murmur heard.  No friction rub. No gallop.   Pulmonary:     Effort: Pulmonary effort is normal. No respiratory distress.     Breath sounds: Normal breath sounds. No wheezing, rhonchi or rales.  Abdominal:     General: Bowel sounds are normal.     Palpations: Abdomen is soft. There is no mass.     Tenderness: There is no abdominal tenderness. There is no guarding or rebound.  Musculoskeletal:        General: No tenderness. Normal range of motion.     Cervical back: Normal range of motion and neck supple.  Lymphadenopathy:     Cervical: No cervical adenopathy.  Skin:    General: Skin is warm.     Findings: No rash.  Neurological:     Mental Status: She is alert and oriented to person, place, and time.     Cranial Nerves: No cranial nerve deficit.     Deep Tendon Reflexes: Reflexes normal.  Psychiatric:        Mood and Affect: Mood is not anxious or depressed.     Wt Readings from Last 3 Encounters:  05/02/20 211 lb (95.7 kg)  04/04/20 212 lb (96.2 kg)  01/01/20 211 lb (95.7 kg)    BP 120/70   Pulse 78   Ht 5\' 8"  (1.727 m)   Wt 211 lb (95.7 kg)   BMI 32.08 kg/m   Assessment and Plan: 1. Acute cystitis without hematuria Acute.  Persistent.  Patient has had for the duration of weekend.  Stable.  Urinalysis unable to interpret due to Azo.  Will initiate cephalexin 500 mg 3 times a day for 5 days. - POCT urinalysis dipstick - cephALEXin (KEFLEX) 500 MG capsule; Take 1 capsule (500 mg total) by mouth 3 (three) times daily.  Dispense: 15 capsule; Refill: 0  2. Essential  hypertension Chronic.  Controlled.  Stable.  Continue amlodipine 2.5 mg once a day and losartan hydrochlorothiazide 100-25 mg once a day. - amLODipine (NORVASC) 2.5 MG tablet; Take 1 tablet (2.5 mg total) by mouth daily.  Dispense: 90 tablet; Refill: 1 - losartan-hydrochlorothiazide (HYZAAR) 100-25 MG tablet; TAKE (1) TABLET BY MOUTH EVERY DAY AT 6 IN THE MORNING  Dispense: 90 tablet;  Refill: 1  3. Hyperlipidemia, unspecified hyperlipidemia type Chronic.  Controlled.  Stable.  Continue atorvastatin 10 mg once a day. - atorvastatin (LIPITOR) 10 MG tablet; 1 daily  Dispense: 90 tablet; Refill: 1  4. Type 2 diabetes mellitus without complication, without long-term current use of insulin ([facility]) Chronic.  Controlled.  Stable.  Continue glipizide 2.5 mg once a day as well as Metformin 500 mg 1 twice a day.  Will check A1c. - HgB A1c - glipiZIDE (GLUCOTROL XL) 2.5 MG 24 hr tablet; TAKE (1) TABLET BY MOUTH EVERY DAY AT THE SAME TIME EVERY MORNING  Dispense: 90 tablet; Refill: 1 - metFORMIN (GLUCOPHAGE) 500 MG tablet; Take 1 tab BID  Dispense: 180 tablet; Refill: 1  5. GERD without esophagitis Chronic.  Controlled.  Stable.  Continue omeprazole 20 mg once a day. - omeprazole (PRILOSEC) 20 MG capsule; TAKE ONE CAPSULE EACH MORNING AT THE SAME TIME  Dispense: 90 capsule; Refill: 1

## 2020-05-03 ENCOUNTER — Telehealth: Payer: Self-pay

## 2020-05-03 LAB — HEMOGLOBIN A1C
Est. average glucose Bld gHb Est-mCnc: 120 mg/dL
Hgb A1c MFr Bld: 5.8 % — ABNORMAL HIGH (ref 4.8–5.6)

## 2020-05-03 NOTE — Telephone Encounter (Signed)
Spoke to pt

## 2020-05-03 NOTE — Telephone Encounter (Signed)
Copied from [place] [id number]. Topic: General - Other >> May 03, 2020  1:57 PM Erick Blinks wrote: Reason for CRM: Pt needs a call back to discuss her lab results. Please advise if [facility] may disclose   Best contact: [phone]

## 2020-05-19 ENCOUNTER — Other Ambulatory Visit: Payer: Self-pay

## 2020-05-19 ENCOUNTER — Ambulatory Visit (INDEPENDENT_AMBULATORY_CARE_PROVIDER_SITE_OTHER): Payer: Medicare Other

## 2020-05-19 DIAGNOSIS — Z23 Encounter for immunization: Secondary | ICD-10-CM

## 2020-06-14 ENCOUNTER — Ambulatory Visit (INDEPENDENT_AMBULATORY_CARE_PROVIDER_SITE_OTHER): Payer: Medicare Other

## 2020-06-14 ENCOUNTER — Ambulatory Visit: Payer: Medicare Other | Admitting: Podiatry

## 2020-06-14 ENCOUNTER — Other Ambulatory Visit: Payer: Self-pay

## 2020-06-14 DIAGNOSIS — M79674 Pain in right toe(s): Secondary | ICD-10-CM

## 2020-06-14 DIAGNOSIS — B351 Tinea unguium: Secondary | ICD-10-CM

## 2020-06-14 DIAGNOSIS — M7751 Other enthesopathy of right foot: Secondary | ICD-10-CM

## 2020-06-14 DIAGNOSIS — M778 Other enthesopathies, not elsewhere classified: Secondary | ICD-10-CM | POA: Diagnosis not present

## 2020-06-14 DIAGNOSIS — M79675 Pain in left toe(s): Secondary | ICD-10-CM

## 2020-06-14 NOTE — Progress Notes (Signed)
   SUBJECTIVE Patient with a history of diabetes mellitus presents to office today complaining of elongated, thickened nails that cause pain while ambulating in shoes.  She is unable to trim her own nails. Patient also complains of chronic pain and tenderness to the dorsum of the bilateral feet.  She states that she can feel a palpable bump on top of both of her feet that rub in her shoe gear.  She has not done anything for treatment.  Patient is here for further evaluation and treatment.   Past Medical History:  Diagnosis Date  . Diabetes mellitus without complication ([place])   . GERD (gastroesophageal reflux disease)   . Hyperlipidemia   . Hypertension     OBJECTIVE General Patient is awake, alert, and oriented x 3 and in no acute distress. Derm Skin is dry and supple bilateral. Negative open lesions or macerations. Remaining integument unremarkable. Nails are tender, long, thickened and dystrophic with subungual debris, consistent with onychomycosis, 1-5 bilateral. No signs of infection noted. Vasc  DP and PT pedal pulses palpable bilaterally. Temperature gradient within normal limits.  Neuro Epicritic and protective threshold sensation diminished bilaterally.  Musculoskeletal Exam No symptomatic pedal deformities noted bilateral. Muscular strength within normal limits.  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  Joint spaces preserved.  Orthopedic hardware is intact from prior bunionectomy and hammertoe surgery.  No fracture identified  ASSESSMENT 1. Diabetes Mellitus w/ peripheral neuropathy 2. Onychomycosis of nail due to dermatophyte bilateral 3.  Capsulitis bilateral feet  PLAN OF CARE 1. Patient evaluated today. 2. Instructed to maintain good pedal hygiene and foot care. Stressed importance of  controlling blood sugar.  3. Mechanical debridement of nails 1-5 bilaterally performed using a nail nipper. Filed with dremel without incident.  4.  Injection of 0.5 cc Celestone Soluspan injected into the bilateral feet/midtarsal joint  5.  Return to clinic in 3 mos. for routine diabetic foot care    Edrick Kins, DPM [facility]  Dr. Edrick Kins, [place]                                        [place], [place] [address]                Office [phone]  Fax [phone]

## 2020-07-13 ENCOUNTER — Other Ambulatory Visit: Payer: Self-pay

## 2020-07-13 ENCOUNTER — Encounter: Payer: Self-pay | Admitting: Family Medicine

## 2020-07-13 ENCOUNTER — Ambulatory Visit (INDEPENDENT_AMBULATORY_CARE_PROVIDER_SITE_OTHER): Payer: Medicare Other | Admitting: Family Medicine

## 2020-07-13 VITALS — BP 128/64 | HR 78 | Temp 99.1°F | Ht 68.0 in | Wt 201.0 lb

## 2020-07-13 DIAGNOSIS — J0100 Acute maxillary sinusitis, unspecified: Secondary | ICD-10-CM | POA: Diagnosis not present

## 2020-07-13 DIAGNOSIS — R059 Cough, unspecified: Secondary | ICD-10-CM | POA: Diagnosis not present

## 2020-07-13 MED ORDER — GUAIFENESIN-CODEINE 100-10 MG/5ML PO SYRP: 5.0000 mL | ORAL_SOLUTION | Freq: Four times a day (QID) | ORAL | 0 refills | Status: DC | PRN

## 2020-07-13 MED ORDER — AMOXICILLIN 500 MG PO CAPS: 500.0000 mg | ORAL_CAPSULE | Freq: Three times a day (TID) | ORAL | 0 refills | Status: DC

## 2020-07-13 NOTE — Progress Notes (Signed)
Date:  07/13/2020   Name:  Frances Harmon   DOB:  [birth year]   MRN:  [id number]   Chief Complaint: Sinusitis (Drainage in throat. Coughing - clear production. No shortness of breathe. No fever. X 2 days.)  Sinusitis This is a new problem. The current episode started in the past 7 days. The problem has been gradually improving since onset. There has been no fever. The fever has been present for less than 1 day. The pain is moderate. Associated symptoms include congestion, coughing and sinus pressure. Pertinent negatives include no chills, diaphoresis, ear pain, headaches, hoarse voice, neck pain, shortness of breath, sneezing, sore throat or swollen glands. Past treatments include oral decongestants. The treatment provided no relief.    Lab Results  Component Value Date   CREATININE 0.87 10/22/2019   BUN 14 10/22/2019   NA 145 (H) 10/22/2019   K 4.8 10/22/2019   CL 104 10/22/2019   CO2 25 10/22/2019   Lab Results  Component Value Date   CHOL 152 10/22/2019   HDL 49 10/22/2019   LDLCALC 78 10/22/2019   TRIG 142 10/22/2019   CHOLHDL 2.4 09/12/2017   No results found for: TSH Lab Results  Component Value Date   HGBA1C 5.8 (H) 05/02/2020   Lab Results  Component Value Date   WBC 7.4 04/23/2019   HGB 13.5 04/23/2019   HCT 39.4 04/23/2019   MCV 85 04/23/2019   PLT 139 (L) 04/23/2019   Lab Results  Component Value Date   ALT 11 10/22/2019   AST 13 10/22/2019   ALKPHOS 90 10/22/2019   BILITOT 0.5 10/22/2019     Review of Systems  Constitutional: Negative.  Negative for chills, diaphoresis, fatigue, fever and unexpected weight change.  HENT: Positive for congestion and sinus pressure. Negative for ear discharge, ear pain, hoarse voice, rhinorrhea, sneezing and sore throat.   Eyes: Negative for double vision, photophobia, pain, discharge, redness and itching.  Respiratory: Positive for cough. Negative for shortness of breath, wheezing and stridor.    Gastrointestinal: Negative for abdominal pain, blood in stool, constipation, diarrhea, nausea and vomiting.  Endocrine: Negative for cold intolerance, heat intolerance, polydipsia, polyphagia and polyuria.  Genitourinary: Negative for dysuria, flank pain, frequency, hematuria, menstrual problem, pelvic pain, urgency, vaginal bleeding and vaginal discharge.  Musculoskeletal: Negative for arthralgias, back pain, myalgias and neck pain.  Skin: Negative for rash.  Allergic/Immunologic: Negative for environmental allergies and food allergies.  Neurological: Negative for dizziness, weakness, light-headedness, numbness and headaches.  Hematological: Negative for adenopathy. Does not bruise/bleed easily.  Psychiatric/Behavioral: Negative for dysphoric mood. The patient is not nervous/anxious.     Patient Active Problem List   Diagnosis Date Noted  . Thrombocytopenia ([place]) 04/23/2019  . Type 2 diabetes mellitus with complication, without long-term current use of insulin ([place]) 04/28/2015  . Essential hypertension 04/28/2015  . Hyperlipidemia 04/28/2015  . Esophageal reflux 04/28/2015  . Vitamin D deficiency 04/28/2015    No Known Allergies  Past Surgical History:  Procedure Laterality Date  . COLONOSCOPY  2009   repeat in 5 years- Dr Bary Leriche  . COLONOSCOPY WITH PROPOFOL N/A 09/09/2015   Procedure: COLONOSCOPY WITH PROPOFOL;  Surgeon: Hulen Luster, MD;  Location: [facility] ENDOSCOPY;  Service: Gastroenterology;  Laterality: N/A;  . FOOT SURGERY Bilateral    Bunion removal  . INCONTINENCE SURGERY    . VAGINAL HYSTERECTOMY    . VENTRAL HERNIA REPAIR N/A 05/06/2015   Procedure: HERNIA REPAIR VENTRAL ADULT;  Surgeon: Hillery Aldo  Tamala Julian, MD;  Location: [facility] ORS;  Service: General;  Laterality: N/A;    Social History   Tobacco Use  . Smoking status: Never Smoker  . Smokeless tobacco: Never Used  . Tobacco comment: smokin cessation materials not required  Vaping Use  . Vaping Use: Never used   Substance Use Topics  . Alcohol use: No    Alcohol/week: 0.0 standard drinks  . Drug use: No     Medication list has been reviewed and updated.  Current Meds  Medication Sig  . amLODipine (NORVASC) 2.5 MG tablet Take 1 tablet (2.5 mg total) by mouth daily.  Marland Kitchen atorvastatin (LIPITOR) 10 MG tablet 1 daily  . cyclobenzaprine (FLEXERIL) 5 MG tablet Take 1 tablet (5 mg total) by mouth 3 (three) times daily as needed for muscle spasms.  Marland Kitchen glipiZIDE (GLUCOTROL XL) 2.5 MG 24 hr tablet TAKE (1) TABLET BY MOUTH EVERY DAY AT THE SAME TIME EVERY MORNING  . losartan-hydrochlorothiazide (HYZAAR) 100-25 MG tablet TAKE (1) TABLET BY MOUTH EVERY DAY AT 6 IN THE MORNING  . metFORMIN (GLUCOPHAGE) 500 MG tablet Take 1 tab BID  . omeprazole (PRILOSEC) 20 MG capsule TAKE ONE CAPSULE EACH MORNING AT THE SAME TIME    PHQ 2/9 Scores 05/02/2020 04/04/2020 03/02/2020 01/01/2020  PHQ - 2 Score 0 0 0 0  PHQ- 9 Score 0 0 - 0    GAD 7 : Generalized Anxiety Score 05/02/2020 04/04/2020 01/01/2020 10/22/2019  Nervous, Anxious, on Edge 0 0 0 0  Control/stop worrying 0 0 0 0  Worry too much - different things 0 0 0 0  Trouble relaxing 0 0 0 0  Restless 0 0 0 0  Easily annoyed or irritable 0 0 0 0  Afraid - awful might happen 0 0 0 0  Total GAD 7 Score 0 0 0 0  Anxiety Difficulty - - - Not difficult at all    BP Readings from Last 3 Encounters:  07/13/20 128/64  05/02/20 120/70  04/04/20 120/70    Physical Exam Vitals and nursing note reviewed.  Constitutional:      Appearance: She is well-developed and well-nourished.  HENT:     Head: Normocephalic.     Jaw: There is normal jaw occlusion.     Right Ear: Hearing, tympanic membrane, ear canal and external ear normal. There is no impacted cerumen.     Left Ear: Hearing, tympanic membrane, ear canal and external ear normal. There is no impacted cerumen.     Nose: Nose normal.     Right Turbinates: Swollen.     Left Turbinates: Swollen.     Right Sinus: No  maxillary sinus tenderness or frontal sinus tenderness.     Left Sinus: No maxillary sinus tenderness or frontal sinus tenderness.     Mouth/Throat:     Lips: Pink.     Mouth: Oropharynx is clear and moist. Mucous membranes are moist.     Pharynx: Oropharynx is clear. Uvula midline.     Tonsils: No tonsillar exudate.  Eyes:     General: Lids are everted, no foreign bodies appreciated. No scleral icterus.       Left eye: No foreign body or hordeolum.     Extraocular Movements: EOM normal.     Conjunctiva/sclera: Conjunctivae normal.     Right eye: Right conjunctiva is not injected.     Left eye: Left conjunctiva is not injected.     Pupils: Pupils are equal, round, and reactive to light.  Neck:  Thyroid: No thyromegaly.     Vascular: No JVD.     Trachea: No tracheal deviation.  Cardiovascular:     Rate and Rhythm: Normal rate and regular rhythm.     Pulses: Intact distal pulses.     Heart sounds: Normal heart sounds. No murmur heard. No friction rub. No gallop.   Pulmonary:     Effort: Pulmonary effort is normal. No respiratory distress.     Breath sounds: Normal breath sounds. No wheezing, rhonchi or rales.  Abdominal:     General: Bowel sounds are normal.     Palpations: Abdomen is soft. There is no hepatosplenomegaly or mass.     Tenderness: There is no abdominal tenderness. There is no guarding or rebound.  Musculoskeletal:        General: No tenderness or edema. Normal range of motion.     Cervical back: Normal range of motion and neck supple.  Lymphadenopathy:     Cervical: No cervical adenopathy.  Skin:    General: Skin is warm.     Findings: No rash.  Neurological:     Mental Status: She is alert and oriented to person, place, and time.     Cranial Nerves: No cranial nerve deficit.     Deep Tendon Reflexes: Strength normal. Reflexes normal.  Psychiatric:        Mood and Affect: Mood and affect normal. Mood is not anxious or depressed.     Wt Readings from  Last 3 Encounters:  07/13/20 201 lb (91.2 kg)  05/02/20 211 lb (95.7 kg)  04/04/20 212 lb (96.2 kg)    BP 128/64   Pulse 78   Temp 99.1 F (37.3 C) (Oral)   Ht 5\' 8"  (1.727 m)   Wt 201 lb (91.2 kg)   SpO2 98%   BMI 30.56 kg/m   Assessment and Plan: 1. Acute maxillary sinusitis, recurrence not specified New onset.  Persistent.  Stable.  Patient has upper respiratory congestion which is producing postnasal drainage which is affecting the patient by causing her to cough.  Exam and history is consistent with an early sinus infection and we will initiate amoxicillin 500 mg 3 times a day for 10 days. - amoxicillin (AMOXIL) 500 MG capsule; Take 1 capsule (500 mg total) by mouth 3 (three) times daily.  Dispense: 30 capsule; Refill: 0  2. Cough New onset.  Persistent.  Relatively stable.  But it is affecting her being able to sleep at night.  We will initiate Robitussin-AC 1 teaspoon every 6 hours as needed cough. - guaiFENesin-codeine (ROBITUSSIN AC) 100-10 MG/5ML syrup; Take 5 mLs by mouth 4 (four) times daily as needed for cough.  Dispense: 118 mL; Refill: 0

## 2020-07-19 ENCOUNTER — Telehealth: Payer: Self-pay

## 2020-07-19 NOTE — Telephone Encounter (Unsigned)
Copied from CRM [id number]. Topic: General - Other >> Jul 19, 2020 10:16 AM Jaquita Rector A wrote: Reason for CRM: Patient called in to ask Dr Yetta Barre nurse to give her a call in reference to the way that she have been feeling since last seen Ph# [phone]

## 2020-07-19 NOTE — Telephone Encounter (Signed)
Tried to call phone is busy 

## 2020-07-20 DIAGNOSIS — E782 Mixed hyperlipidemia: Secondary | ICD-10-CM | POA: Diagnosis not present

## 2020-07-20 DIAGNOSIS — I10 Essential (primary) hypertension: Secondary | ICD-10-CM | POA: Diagnosis not present

## 2020-07-20 DIAGNOSIS — E119 Type 2 diabetes mellitus without complications: Secondary | ICD-10-CM | POA: Diagnosis not present

## 2020-07-20 NOTE — Telephone Encounter (Signed)
Patient stated she had a power outage yesterday when Frances Harmon called and would like a callback

## 2020-07-21 ENCOUNTER — Other Ambulatory Visit: Payer: Self-pay

## 2020-07-21 NOTE — Telephone Encounter (Signed)
Called with needing to pick up Imodium AD

## 2020-07-23 DIAGNOSIS — N189 Chronic kidney disease, unspecified: Secondary | ICD-10-CM | POA: Insufficient documentation

## 2020-07-27 ENCOUNTER — Other Ambulatory Visit: Payer: Self-pay

## 2020-07-27 ENCOUNTER — Emergency Department
Admission: EM | Admit: 2020-07-27 | Discharge: 2020-07-27 | Disposition: A | Discharge status: Home / Self Care | Payer: Medicare Other | Attending: Emergency Medicine | Admitting: Emergency Medicine

## 2020-07-27 ENCOUNTER — Encounter: Payer: Self-pay | Admitting: Emergency Medicine

## 2020-07-27 ENCOUNTER — Emergency Department: Payer: Medicare Other

## 2020-07-27 DIAGNOSIS — E119 Type 2 diabetes mellitus without complications: Secondary | ICD-10-CM | POA: Diagnosis not present

## 2020-07-27 DIAGNOSIS — K219 Gastro-esophageal reflux disease without esophagitis: Secondary | ICD-10-CM | POA: Diagnosis not present

## 2020-07-27 DIAGNOSIS — Z7984 Long term (current) use of oral hypoglycemic drugs: Secondary | ICD-10-CM | POA: Insufficient documentation

## 2020-07-27 DIAGNOSIS — I10 Essential (primary) hypertension: Secondary | ICD-10-CM | POA: Insufficient documentation

## 2020-07-27 DIAGNOSIS — R0789 Other chest pain: Secondary | ICD-10-CM

## 2020-07-27 DIAGNOSIS — Z79899 Other long term (current) drug therapy: Secondary | ICD-10-CM | POA: Insufficient documentation

## 2020-07-27 DIAGNOSIS — R079 Chest pain, unspecified: Secondary | ICD-10-CM | POA: Diagnosis not present

## 2020-07-27 LAB — BASIC METABOLIC PANEL
Anion gap: 11 (ref 5–15)
BUN: 13 mg/dL (ref 8–23)
CO2: 27 mmol/L (ref 22–32)
Calcium: 9.6 mg/dL (ref 8.9–10.3)
Chloride: 104 mmol/L (ref 98–111)
Creatinine, Ser: 0.81 mg/dL (ref 0.44–1.00)
GFR, Estimated: >60 mL/min (ref >60)
Glucose, Bld: 106 mg/dL — ABNORMAL HIGH (ref 70–99)
Potassium: 4.4 mmol/L (ref 3.5–5.1)
Sodium: 142 mmol/L (ref 135–145)

## 2020-07-27 LAB — CBC
HCT: 38.9 % (ref 36.0–46.0)
Hemoglobin: 12.8 g/dL (ref 12.0–15.0)
MCH: 29 pg (ref 26.0–34.0)
MCHC: 32.9 g/dL (ref 30.0–36.0)
MCV: 88 fL (ref 80.0–100.0)
Platelets: 162 10*3/uL (ref 150–400)
RBC: 4.42 MIL/uL (ref 3.87–5.11)
RDW: 12.7 % (ref 11.5–15.5)
WBC: 5.9 10*3/uL (ref 4.0–10.5)
nRBC: 0 % (ref 0.0–0.2)

## 2020-07-27 LAB — TROPONIN I (HIGH SENSITIVITY)
Troponin I (High Sensitivity): 5 ng/L (ref <18)
Troponin I (High Sensitivity): 5 ng/L (ref <18)

## 2020-07-27 MED ORDER — NAPROXEN 375 MG PO TABS: 375.0000 mg | ORAL_TABLET | Freq: Two times a day (BID) | ORAL | 0 refills | Status: AC

## 2020-07-27 NOTE — ED Notes (Addendum)
Pt presents to ED with c/o of having R sided chest pain that started last night while pt was laying down to sleep. Pt denies HX of MI or any other cardiac issues other than hypertension. Pt states today pain is still there but "much better", currently rating pain 3/10 and desribes it as a squeezing. Pt denies cough or SOB. Pt denies fevers or chills. Pt is A&Ox4. Pt states similar episode 5 years ago and was put on omeprazole for GERD and states any other issues than new onset yesterday. Pt denies any recent heavy lifting out of the ordinary.

## 2020-07-27 NOTE — Discharge Instructions (Addendum)
Take the prescribed anti-inflammatory  Try to minimize recurrent use of your R arm at work for the next week, to allow your chest wall to heal  For your blood pressure, keep track of your pressures at home. If you are consistently in the 120s systolic (on top), I'd recommend staying at your prior amlodipine dose of 2.5 mg, prescribed by Dr. Yetta Barre  Return to the ER as needed

## 2020-07-27 NOTE — ED Triage Notes (Signed)
Pt comes into the ED via POV c/o right side chest pain with some radiation into the right shoulder blade.  Pt denies any nausea, SHOB, or dizziness.  Pt denies any cardiac history.  Pt states the pain started last night and is described as a tightness or squeezing.  Pt currently has even and unlabored respirations and is in NAD.

## 2020-07-27 NOTE — ED Provider Notes (Signed)
[facility] Emergency Department Provider Note  ____________________________________________   Event Date/Time   First MD Initiated Contact with Patient 07/27/20 1137     (approximate)  I have reviewed the triage vital signs and the nursing notes.   HISTORY  Chief Complaint Chest Pain    HPI Frances Harmon is a 75 y.o. female with history of hypertension, hyperlipidemia, diabetes, here with right-sided chest pain.  The patient states that her symptoms started last night when she was lying down to go to bed.  She noticed an aching, throbbing, right lower chest wall pain.  The pain is worse with palpation and movements.  The pain has happened before, and she was told it was related to GI issues, that she has had no nausea.  No associated shortness of breath.  No diaphoresis.  No history of cardiac disease, and she does see cardiology fairly regularly.  She does note her atorvastatin was recently increased and her blood pressure medication, but she has not been taking the high blood pressure medicine.  No fevers or chills.  No other complaints.  No recent trauma.  Of note, she does work in a [employer] and frequently skips food with her right arm.  Symptoms do seem worse with some more movement.        Past Medical History:  Diagnosis Date  . Diabetes mellitus without complication (HCC)   . GERD (gastroesophageal reflux disease)   . Hyperlipidemia   . Hypertension     Patient Active Problem List   Diagnosis Date Noted  . Thrombocytopenia (H[facility]) 04/23/2019  . Type 2 diabetes mellitus with complication, without long-term current use of insulin ([facility]) 04/28/2015  . Essential hypertension 04/28/2015  . Hyperlipidemia 04/28/2015  . Esophageal reflux 04/28/2015  . Vitamin D deficiency 04/28/2015    Past Surgical History:  Procedure Laterality Date  . COLONOSCOPY  2009   repeat in 5 years- Dr Porfirio Mylar  . COLONOSCOPY WITH PROPOFOL N/A 09/09/2015    Procedure: COLONOSCOPY WITH PROPOFOL;  Surgeon: Wallace Cullens, MD;  Location: [facility] ENDOSCOPY;  Service: Gastroenterology;  Laterality: N/A;  . FOOT SURGERY Bilateral    Bunion removal  . INCONTINENCE SURGERY    . VAGINAL HYSTERECTOMY    . VENTRAL HERNIA REPAIR N/A 05/06/2015   Procedure: HERNIA REPAIR VENTRAL ADULT;  Surgeon: Nadeen Landau, MD;  Location: [facility] ORS;  Service: General;  Laterality: N/A;    Prior to Admission medications   Medication Sig Start Date End Date Taking? Authorizing Provider  naproxen (NAPROSYN) 375 MG tablet Take 1 tablet (375 mg total) by mouth 2 (two) times daily with a meal for 5 days. 07/27/20 08/01/20 Yes Shaune Pollack, MD  amLODipine (NORVASC) 2.5 MG tablet Take 1 tablet (2.5 mg total) by mouth daily. 05/02/20   Duanne Limerick, MD  amLODipine (NORVASC) 5 MG tablet Take by mouth. 07/20/20 07/20/21  [provider]  amoxicillin (AMOXIL) 500 MG capsule Take 1 capsule (500 mg total) by mouth 3 (three) times daily. 07/13/20   Duanne Limerick, MD  atorvastatin (LIPITOR) 10 MG tablet 1 daily 05/02/20   Duanne Limerick, MD  atorvastatin (LIPITOR) 20 MG tablet Take by mouth. 07/20/20 07/20/21  [provider]  cyclobenzaprine (FLEXERIL) 5 MG tablet Take 1 tablet (5 mg total) by mouth 3 (three) times daily as needed for muscle spasms. 01/01/20   Duanne Limerick, MD  glipiZIDE (GLUCOTROL XL) 2.5 MG 24 hr tablet TAKE (1) TABLET BY MOUTH EVERY  DAY AT THE SAME TIME EVERY MORNING 05/02/20   Juline Patch, MD  guaiFENesin-codeine [facility]) 100-10 MG/5ML syrup Take 5 mLs by mouth 4 (four) times daily as needed for cough. 07/13/20   Juline Patch, MD  losartan-hydrochlorothiazide (HYZAAR) 100-25 MG tablet TAKE (1) TABLET BY MOUTH EVERY DAY AT 6 IN THE MORNING 05/02/20   Juline Patch, MD  metFORMIN (GLUCOPHAGE) 500 MG tablet Take 1 tab BID 05/02/20   Juline Patch, MD  omeprazole (PRILOSEC) 20 MG capsule TAKE ONE CAPSULE EACH MORNING AT THE SAME  TIME 05/02/20   Juline Patch, MD    Allergies Patient has no known allergies.  Family History  Problem Relation Age of Onset  . Breast cancer Daughter 67  . Breast cancer Mother 16  . Hypertension Brother   . Diabetes Brother   . Arthritis Brother     Social History Social History   Tobacco Use  . Smoking status: Never Smoker  . Smokeless tobacco: Never Used  . Tobacco comment: smokin cessation materials not required  Vaping Use  . Vaping Use: Never used  Substance Use Topics  . Alcohol use: No    Alcohol/week: 0.0 standard drinks  . Drug use: No    Review of Systems  Review of Systems  Constitutional: Negative for fatigue and fever.  HENT: Negative for congestion and sore throat.   Eyes: Negative for visual disturbance.  Respiratory: Negative for cough and shortness of breath.   Cardiovascular: Positive for chest pain.  Gastrointestinal: Negative for abdominal pain, diarrhea, nausea and vomiting.  Genitourinary: Negative for flank pain.  Musculoskeletal: Negative for back pain and neck pain.  Skin: Negative for rash and wound.  Neurological: Negative for weakness.  All other systems reviewed and are negative.    ____________________________________________  PHYSICAL EXAM:      VITAL SIGNS: ED Triage Vitals  Enc Vitals Group     BP 07/27/20 0918 (!) 186/85     Pulse Rate 07/27/20 0918 77     Resp 07/27/20 0918 17     Temp 07/27/20 0918 98.9 F (37.2 C)     Temp Source 07/27/20 0918 Oral     SpO2 07/27/20 0918 98 %     Weight 07/27/20 0916 199 lb (90.3 kg)     Height 07/27/20 0916 5\' 8"  (1.727 m)     Head Circumference --      Peak Flow --      Pain Score 07/27/20 0916 1     Pain Loc --      Pain Edu? --      Excl. in [place]? --      Physical Exam Vitals and nursing note reviewed.  Constitutional:      General: She is not in acute distress.    Appearance: She is well-developed.  HENT:     Head: Normocephalic and atraumatic.  Eyes:      Conjunctiva/sclera: Conjunctivae normal.  Cardiovascular:     Rate and Rhythm: Normal rate and regular rhythm.     Heart sounds: Normal heart sounds. No murmur heard. No friction rub.  Pulmonary:     Effort: Pulmonary effort is normal. No respiratory distress.     Breath sounds: Normal breath sounds. No wheezing or rales.  Chest:     Comments: Moderate tenderness to palpation of the right parasternal chest wall. Abdominal:     General: There is no distension.     Palpations: Abdomen is soft.  Tenderness: There is no abdominal tenderness.  Musculoskeletal:     Cervical back: Neck supple.  Skin:    General: Skin is warm.     Capillary Refill: Capillary refill takes less than 2 seconds.  Neurological:     Mental Status: She is alert and oriented to person, place, and time.     Motor: No abnormal muscle tone.       ____________________________________________   LABS (all labs ordered are listed, but only abnormal results are displayed)  Labs Reviewed  BASIC METABOLIC PANEL - Abnormal; Notable for the following components:      Result Value   Glucose, Bld 106 (*)    All other components within normal limits  CBC  TROPONIN I (HIGH SENSITIVITY)  TROPONIN I (HIGH SENSITIVITY)    ____________________________________________  EKG: Normal sinus rhythm, ventricular rate 82.  PR 176, QRS 92, QTc 436.  No acute ST elevation or depression.  No acute evidence of acute ischemia or infarct. ________________________________________  RADIOLOGY All imaging, including plain films, CT scans, and ultrasounds, independently reviewed by me, and interpretations confirmed via formal radiology reads.  ED MD interpretation:   CXR: Bibasilar airspace disease c/f PNA  Official radiology report(s): DG Chest 2 View  Result Date: 07/27/2020 CLINICAL DATA:  Chest pain, RIGHT-side chest pain since last night when lying down to sleep, history hypertension, diabetes mellitus, GERD EXAM: CHEST - 2  VIEW COMPARISON:  09/02/2008 FINDINGS: Normal heart size, mediastinal contours, and pulmonary vascularity. Atherosclerotic calcification aorta. Patchy opacities at the lung bases, new since prior exam, suspicious for pneumonia. Lungs minimally hyperinflated but otherwise clear. No pleural effusion or pneumothorax. Bones demineralized with mild endplate spur formation thoracic spine. IMPRESSION: Patchy bibasilar opacities suspicious for pneumonia. Aortic Atherosclerosis (ICD10-I70.0). Electronically Signed   By: Lavonia Dana M.D.   On: 07/27/2020 12:40    ____________________________________________  PROCEDURES   Procedure(s) performed (including Critical Care):  Procedures  ____________________________________________  INITIAL IMPRESSION / MDM / [place] / ED COURSE  As part of my medical decision making, I reviewed the following data within the [place] notes reviewed and incorporated, Old chart reviewed, Notes from prior ED visits, and [place] Controlled Substance Database       *ARELLA TREMBLE was evaluated in Emergency Department on 07/27/2020 for the symptoms described in the history of present illness. She was evaluated in the context of the global COVID-19 pandemic, which necessitated consideration that the patient might be at risk for infection with the SARS-CoV-2 virus that causes COVID-19. Institutional protocols and algorithms that pertain to the evaluation of patients at risk for COVID-19 are in a state of rapid change based on information released by regulatory bodies including the CDC and federal and state organizations. These policies and algorithms were followed during the patient's care in the ED.  Some ED evaluations and interventions may be delayed as a result of limited staffing during the pandemic.*     Medical Decision Making:  75 yo F here with positional, reproducible right-sided chest pain. On arrival, EKG is nonischemic Troponin  negative x 2 with low-risk HEART score, do not suspect ACS. Pt has no cough, SOB, tachycardia, tachypnea, or signs to suggest PE. Pain is not concerning for dissection. Incidentally, CXR read as possible PNA. Pt just had a sinusitis/cough and recently completed a full course of antibiotics. She has a normal WBC, no SOB, no hypoxia, no tachypnea, or signs to suggest ongoing PNA. Suspect this is resolving infection.  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 Will have her f/u with her PCP.  ____________________________________________  FINAL CLINICAL IMPRESSION(S) / ED DIAGNOSES  Final diagnoses:  Atypical chest pain  Chest wall pain     MEDICATIONS GIVEN DURING THIS VISIT:  Medications - No data to display   ED Discharge Orders         Ordered    naproxen (NAPROSYN) 375 MG tablet  2 times daily with meals        07/27/20 1255           Note:  This document was prepared using Dragon voice recognition software and may include unintentional dictation errors.   Duffy Bruce, MD 07/27/20 [phone]

## 2020-07-27 NOTE — ED Notes (Signed)
D/C and /fup discussed with pt, pt verbalized understanding. NAD noted.

## 2020-07-28 ENCOUNTER — Other Ambulatory Visit: Payer: Self-pay | Admitting: Family Medicine

## 2020-07-28 DIAGNOSIS — I10 Essential (primary) hypertension: Secondary | ICD-10-CM

## 2020-08-11 ENCOUNTER — Encounter: Payer: Self-pay | Admitting: Family Medicine

## 2020-08-11 ENCOUNTER — Other Ambulatory Visit: Payer: Self-pay

## 2020-08-11 ENCOUNTER — Ambulatory Visit
Admission: RE | Admit: 2020-08-11 | Discharge: 2020-08-11 | Disposition: A | Discharge status: Home / Self Care | Payer: Medicare Other | Source: Ambulatory Visit | Attending: Family Medicine | Admitting: Family Medicine

## 2020-08-11 ENCOUNTER — Ambulatory Visit
Admission: RE | Admit: 2020-08-11 | Discharge: 2020-08-11 | Disposition: A | Discharge status: Home / Self Care | Payer: Medicare Other | Attending: Family Medicine | Admitting: Family Medicine

## 2020-08-11 ENCOUNTER — Ambulatory Visit (INDEPENDENT_AMBULATORY_CARE_PROVIDER_SITE_OTHER): Payer: Medicare Other | Admitting: Family Medicine

## 2020-08-11 VITALS — BP 144/100 | HR 74 | Ht 68.0 in | Wt 202.0 lb

## 2020-08-11 DIAGNOSIS — R9389 Abnormal findings on diagnostic imaging of other specified body structures: Secondary | ICD-10-CM | POA: Diagnosis not present

## 2020-08-11 NOTE — Progress Notes (Signed)
Date:  08/11/2020   Name:  Frances Harmon   DOB:  1945/11/14   MRN:  [id number]   Chief Complaint: Follow-up (Pneumonia- repeat chest xray)  Pneumonia There is no chest tightness, cough, difficulty breathing, frequent throat clearing, hemoptysis, hoarse voice, shortness of breath, sputum production or wheezing. This is a new problem. The current episode started 1 to 4 weeks ago. The problem has been gradually improving. Pertinent negatives include no appetite change, chest pain, dyspnea on exertion, ear congestion, ear pain, fever, headaches, heartburn, malaise/fatigue, myalgias, nasal congestion, orthopnea, PND, postnasal drip, rhinorrhea, sneezing, sore throat, sweats, trouble swallowing or weight loss.    Lab Results  Component Value Date   CREATININE 0.81 07/27/2020   BUN 13 07/27/2020   NA 142 07/27/2020   K 4.4 07/27/2020   CL 104 07/27/2020   CO2 27 07/27/2020   Lab Results  Component Value Date   CHOL 152 10/22/2019   HDL 49 10/22/2019   LDLCALC 78 10/22/2019   TRIG 142 10/22/2019   CHOLHDL 2.4 09/12/2017   No results found for: TSH Lab Results  Component Value Date   HGBA1C 5.8 (H) 05/02/2020   Lab Results  Component Value Date   WBC 5.9 07/27/2020   HGB 12.8 07/27/2020   HCT 38.9 07/27/2020   MCV 88.0 07/27/2020   PLT 162 07/27/2020   Lab Results  Component Value Date   ALT 11 10/22/2019   AST 13 10/22/2019   ALKPHOS 90 10/22/2019   BILITOT 0.5 10/22/2019     Review of Systems  Constitutional: Negative for appetite change, fever, malaise/fatigue and weight loss.  HENT: Negative for ear pain, hoarse voice, postnasal drip, rhinorrhea, sneezing, sore throat and trouble swallowing.   Respiratory: Negative for cough, hemoptysis, sputum production, shortness of breath and wheezing.   Cardiovascular: Negative for chest pain, dyspnea on exertion and PND.  Gastrointestinal: Negative for heartburn.  Musculoskeletal: Negative for myalgias.  Neurological:  Negative for headaches.    Patient Active Problem List   Diagnosis Date Noted  . Thrombocytopenia ([place]) 04/23/2019  . Type 2 diabetes mellitus with complication, without long-term current use of insulin ([place]) 04/28/2015  . Essential hypertension 04/28/2015  . Hyperlipidemia 04/28/2015  . Esophageal reflux 04/28/2015  . Vitamin D deficiency 04/28/2015    No Known Allergies  Past Surgical History:  Procedure Laterality Date  . COLONOSCOPY  2009   repeat in 5 years- Dr Bary Leriche  . COLONOSCOPY WITH PROPOFOL N/A 09/09/2015   Procedure: COLONOSCOPY WITH PROPOFOL;  Surgeon: Hulen Luster, MD;  Location: [facility] ENDOSCOPY;  Service: Gastroenterology;  Laterality: N/A;  . FOOT SURGERY Bilateral    Bunion removal  . INCONTINENCE SURGERY    . VAGINAL HYSTERECTOMY    . VENTRAL HERNIA REPAIR N/A 05/06/2015   Procedure: HERNIA REPAIR VENTRAL ADULT;  Surgeon: Leonie Green, MD;  Location: [facility] ORS;  Service: General;  Laterality: N/A;    Social History   Tobacco Use  . Smoking status: Never Smoker  . Smokeless tobacco: Never Used  . Tobacco comment: smokin cessation materials not required  Vaping Use  . Vaping Use: Never used  Substance Use Topics  . Alcohol use: No    Alcohol/week: 0.0 standard drinks  . Drug use: No     Medication list has been reviewed and updated.  Current Meds  Medication Sig  . amLODipine (NORVASC) 5 MG tablet Take by mouth.  Marland Kitchen atorvastatin (LIPITOR) 20 MG tablet Take by mouth.  Marland Kitchen glipiZIDE (  GLUCOTROL XL) 2.5 MG 24 hr tablet TAKE (1) TABLET BY MOUTH EVERY DAY AT THE SAME TIME EVERY MORNING  . losartan-hydrochlorothiazide (HYZAAR) 100-25 MG tablet TAKE (1) TABLET BY MOUTH EVERY DAY AT 6 IN THE MORNING  . metFORMIN (GLUCOPHAGE) 500 MG tablet Take 1 tab BID  . omeprazole (PRILOSEC) 20 MG capsule TAKE ONE CAPSULE EACH MORNING AT THE SAME TIME  . [DISCONTINUED] guaiFENesin-codeine (ROBITUSSIN AC) 100-10 MG/5ML syrup Take 5 mLs by mouth 4 (four) times daily as  needed for cough.    PHQ 2/9 Scores 05/02/2020 04/04/2020 03/02/2020 01/01/2020  PHQ - 2 Score 0 0 0 0  PHQ- 9 Score 0 0 - 0    GAD 7 : Generalized Anxiety Score 05/02/2020 04/04/2020 01/01/2020 10/22/2019  Nervous, Anxious, on Edge 0 0 0 0  Control/stop worrying 0 0 0 0  Worry too much - different things 0 0 0 0  Trouble relaxing 0 0 0 0  Restless 0 0 0 0  Easily annoyed or irritable 0 0 0 0  Afraid - awful might happen 0 0 0 0  Total GAD 7 Score 0 0 0 0  Anxiety Difficulty - - - Not difficult at all    BP Readings from Last 3 Encounters:  08/11/20 (!) 144/100  07/27/20 (!) 175/82  07/13/20 128/64    Physical Exam  Wt Readings from Last 3 Encounters:  08/11/20 202 lb (91.6 kg)  07/27/20 199 lb (90.3 kg)  07/13/20 201 lb (91.2 kg)    BP (!) 144/100   Pulse 74   Ht 5\' 8"  (1.727 m)   Wt 202 lb (91.6 kg)   SpO2 99%   BMI 30.71 kg/m   Assessment and Plan:   1. Abnormal chest x-ray New onset.  Improving.  Stable.  Patient was treated for sinus infection with amoxicillin on 07/13/2020.  During the course of the treatment she developed atypical chest pain for which she went to the ER and it was noted that on chest x-ray she had bilateral opacities of the lungs consistent with pneumonia.  Patient was not having symptoms of pneumonia but it was thought that maybe she had had a pneumonia when she was seen for her respiratory concern earlier and that was in the process of relapse resolving and patient is here today for a recheck of her pulmonary exam and follow-up chest x-ray.  We will obtain a chest x-ray to see if this is cleared up and proceed accordingly. - DG Chest 2 View; Future

## 2020-08-12 ENCOUNTER — Other Ambulatory Visit: Payer: Self-pay

## 2020-08-12 DIAGNOSIS — I10 Essential (primary) hypertension: Secondary | ICD-10-CM

## 2020-08-12 MED ORDER — HYDROCHLOROTHIAZIDE 25 MG PO TABS: 25.0000 mg | ORAL_TABLET | Freq: Every day | ORAL | 0 refills | Status: DC

## 2020-08-12 MED ORDER — LOSARTAN POTASSIUM 100 MG PO TABS: 100.0000 mg | ORAL_TABLET | Freq: Every day | ORAL | 0 refills | Status: DC

## 2020-09-06 ENCOUNTER — Other Ambulatory Visit: Payer: Self-pay

## 2020-09-06 ENCOUNTER — Ambulatory Visit (INDEPENDENT_AMBULATORY_CARE_PROVIDER_SITE_OTHER): Payer: Medicare Other | Admitting: Family Medicine

## 2020-09-06 ENCOUNTER — Encounter: Payer: Self-pay | Admitting: Family Medicine

## 2020-09-06 VITALS — BP 138/80 | HR 80 | Ht 68.0 in | Wt 203.0 lb

## 2020-09-06 DIAGNOSIS — Z9079 Acquired absence of other genital organ(s): Secondary | ICD-10-CM

## 2020-09-06 DIAGNOSIS — E119 Type 2 diabetes mellitus without complications: Secondary | ICD-10-CM | POA: Diagnosis not present

## 2020-09-06 DIAGNOSIS — Z90710 Acquired absence of both cervix and uterus: Secondary | ICD-10-CM

## 2020-09-06 DIAGNOSIS — E785 Hyperlipidemia, unspecified: Secondary | ICD-10-CM | POA: Diagnosis not present

## 2020-09-06 DIAGNOSIS — L2084 Intrinsic (allergic) eczema: Secondary | ICD-10-CM

## 2020-09-06 DIAGNOSIS — Z90722 Acquired absence of ovaries, bilateral: Secondary | ICD-10-CM

## 2020-09-06 MED ORDER — TRIAMCINOLONE ACETONIDE 0.1 % EX CREA: 1.0000 "application " | TOPICAL_CREAM | Freq: Two times a day (BID) | CUTANEOUS | 0 refills | Status: DC

## 2020-09-06 NOTE — Progress Notes (Signed)
Date:  09/06/2020   Name:  Frances Harmon   DOB:  1945/10/04   MRN:  [id number]   Chief Complaint: Diabetes  Diabetes She presents for her follow-up diabetic visit. She has type 2 diabetes mellitus. Her disease course has been stable. There are no hypoglycemic associated symptoms. Pertinent negatives for hypoglycemia include no dizziness, headaches or nervousness/anxiousness. There are no diabetic associated symptoms. Pertinent negatives for diabetes include no fatigue, no polydipsia, no polyphagia, no polyuria and no weakness. There are no hypoglycemic complications. Symptoms are stable. There are no diabetic complications. Risk factors for coronary artery disease include hypertension, dyslipidemia and diabetes mellitus. She is compliant with treatment all of the time. Her home blood glucose trend is fluctuating minimally. An ACE inhibitor/angiotensin II receptor blocker is being taken.  Hyperlipidemia This is a chronic problem. The current episode started more than 1 year ago. The problem is controlled. Recent lipid tests were reviewed and are normal. She has no history of chronic renal disease, diabetes, hypothyroidism, liver disease, obesity or nephrotic syndrome. Factors aggravating her hyperlipidemia include thiazides. Pertinent negatives include no myalgias or shortness of breath. Current antihyperlipidemic treatment includes statins. The current treatment provides moderate improvement of lipids. There are no compliance problems.   Rash This is a new problem. The current episode started in the past 7 days. The problem is unchanged. The affected locations include the back. The rash is characterized by itchiness. She was exposed to nothing. Pertinent negatives include no congestion, cough, diarrhea, eye pain, fatigue, fever, rhinorrhea, shortness of breath, sore throat or vomiting.    Lab Results  Component Value Date   CREATININE 0.81 07/27/2020   BUN 13 07/27/2020   NA 142 07/27/2020    K 4.4 07/27/2020   CL 104 07/27/2020   CO2 27 07/27/2020   Lab Results  Component Value Date   CHOL 152 10/22/2019   HDL 49 10/22/2019   LDLCALC 78 10/22/2019   TRIG 142 10/22/2019   CHOLHDL 2.4 09/12/2017   No results found for: TSH Lab Results  Component Value Date   HGBA1C 5.8 (H) 05/02/2020   Lab Results  Component Value Date   WBC 5.9 07/27/2020   HGB 12.8 07/27/2020   HCT 38.9 07/27/2020   MCV 88.0 07/27/2020   PLT 162 07/27/2020   Lab Results  Component Value Date   ALT 11 10/22/2019   AST 13 10/22/2019   ALKPHOS 90 10/22/2019   BILITOT 0.5 10/22/2019     Review of Systems  Constitutional: Negative.  Negative for chills, fatigue, fever and unexpected weight change.  HENT: Negative for congestion, ear discharge, ear pain, rhinorrhea, sinus pressure, sneezing and sore throat.   Eyes: Negative for double vision, photophobia, pain, discharge, redness and itching.  Respiratory: Negative for cough, shortness of breath, wheezing and stridor.   Gastrointestinal: Negative for abdominal pain, blood in stool, constipation, diarrhea, nausea and vomiting.  Endocrine: Negative for cold intolerance, heat intolerance, polydipsia, polyphagia and polyuria.  Genitourinary: Negative for dysuria, flank pain, frequency, hematuria, menstrual problem, pelvic pain, urgency, vaginal bleeding and vaginal discharge.  Musculoskeletal: Negative for arthralgias, back pain and myalgias.  Skin: Negative for rash.  Allergic/Immunologic: Negative for environmental allergies and food allergies.  Neurological: Negative for dizziness, weakness, light-headedness, numbness and headaches.  Hematological: Negative for adenopathy. Does not bruise/bleed easily.  Psychiatric/Behavioral: Negative for dysphoric mood. The patient is not nervous/anxious.     Patient Active Problem List   Diagnosis Date Noted  . H/O total  hysterectomy with bilateral salpingo-oophorectomy (BSO) 09/06/2020  .  Thrombocytopenia ([place]) 04/23/2019  . Type 2 diabetes mellitus with complication, without long-term current use of insulin ([place]) 04/28/2015  . Essential hypertension 04/28/2015  . Hyperlipidemia 04/28/2015  . Esophageal reflux 04/28/2015  . Vitamin D deficiency 04/28/2015    No Known Allergies  Past Surgical History:  Procedure Laterality Date  . COLONOSCOPY  2009   repeat in 5 years- Dr Bary Leriche  . COLONOSCOPY WITH PROPOFOL N/A 09/09/2015   Procedure: COLONOSCOPY WITH PROPOFOL;  Surgeon: Hulen Luster, MD;  Location: [facility] ENDOSCOPY;  Service: Gastroenterology;  Laterality: N/A;  . FOOT SURGERY Bilateral    Bunion removal  . INCONTINENCE SURGERY    . VAGINAL HYSTERECTOMY    . VENTRAL HERNIA REPAIR N/A 05/06/2015   Procedure: HERNIA REPAIR VENTRAL ADULT;  Surgeon: Leonie Green, MD;  Location: [facility] ORS;  Service: General;  Laterality: N/A;    Social History   Tobacco Use  . Smoking status: Never Smoker  . Smokeless tobacco: Never Used  . Tobacco comment: smokin cessation materials not required  Vaping Use  . Vaping Use: Never used  Substance Use Topics  . Alcohol use: No    Alcohol/week: 0.0 standard drinks  . Drug use: No     Medication list has been reviewed and updated.  Current Meds  Medication Sig  . amLODipine (NORVASC) 5 MG tablet Take by mouth.  Marland Kitchen atorvastatin (LIPITOR) 20 MG tablet Take by mouth.  Marland Kitchen glipiZIDE (GLUCOTROL XL) 2.5 MG 24 hr tablet TAKE (1) TABLET BY MOUTH EVERY DAY AT THE SAME TIME EVERY MORNING  . hydrochlorothiazide (HYDRODIURIL) 25 MG tablet Take 1 tablet (25 mg total) by mouth daily.  Marland Kitchen losartan (COZAAR) 100 MG tablet Take 1 tablet (100 mg total) by mouth daily.  . metFORMIN (GLUCOPHAGE) 500 MG tablet Take 1 tab BID  . omeprazole (PRILOSEC) 20 MG capsule TAKE ONE CAPSULE EACH MORNING AT THE SAME TIME    [facility] 2/9 Scores 09/06/2020 05/02/2020 04/04/2020 03/02/2020  PHQ - 2 Score 0 0 0 0  PHQ- 9 Score 0 0 0 -    GAD 7 : Generalized Anxiety  Score 09/06/2020 05/02/2020 04/04/2020 01/01/2020  Nervous, Anxious, on Edge 0 0 0 0  Control/stop worrying 0 0 0 0  Worry too much - different things 0 0 0 0  Trouble relaxing 0 0 0 0  Restless 0 0 0 0  Easily annoyed or irritable 0 0 0 0  Afraid - awful might happen 0 0 0 0  Total GAD 7 Score 0 0 0 0  Anxiety Difficulty - - - -    BP Readings from Last 3 Encounters:  09/06/20 138/80  08/11/20 (!) 144/100  07/27/20 (!) 175/82    Physical Exam Vitals and nursing note reviewed.  Constitutional:      Appearance: She is well-developed and well-nourished.  HENT:     Head: Normocephalic.     Right Ear: Tympanic membrane, ear canal and external ear normal. There is no impacted cerumen.     Left Ear: Tympanic membrane, ear canal and external ear normal. There is no impacted cerumen.     Nose: Nose normal.     Mouth/Throat:     Mouth: Oropharynx is clear and moist. Mucous membranes are moist.  Eyes:     General: Lids are everted, no foreign bodies appreciated. No scleral icterus.       Left eye: No foreign body or hordeolum.  Extraocular Movements: EOM normal.     Conjunctiva/sclera: Conjunctivae normal.     Right eye: Right conjunctiva is not injected.     Left eye: Left conjunctiva is not injected.     Pupils: Pupils are equal, round, and reactive to light.  Neck:     Thyroid: No thyromegaly.     Vascular: No JVD.     Trachea: No tracheal deviation.  Cardiovascular:     Rate and Rhythm: Normal rate and regular rhythm.     Pulses: Intact distal pulses.     Heart sounds: Normal heart sounds. No murmur heard. No friction rub. No gallop.   Pulmonary:     Effort: Pulmonary effort is normal. No respiratory distress.     Breath sounds: Normal breath sounds. No wheezing or rales.  Abdominal:     General: Bowel sounds are normal.     Palpations: Abdomen is soft. There is no hepatosplenomegaly or mass.     Tenderness: There is no abdominal tenderness. There is no guarding or  rebound.  Musculoskeletal:        General: No tenderness or edema. Normal range of motion.     Cervical back: Normal range of motion and neck supple.  Lymphadenopathy:     Cervical: No cervical adenopathy.  Skin:    General: Skin is warm.     Findings: Rash present. Rash is crusting and macular.  Neurological:     Mental Status: She is alert and oriented to person, place, and time.     Cranial Nerves: No cranial nerve deficit.     Deep Tendon Reflexes: Strength normal. Reflexes normal.  Psychiatric:        Mood and Affect: Mood and affect normal. Mood is not anxious or depressed.     Wt Readings from Last 3 Encounters:  09/06/20 203 lb (92.1 kg)  08/11/20 202 lb (91.6 kg)  07/27/20 199 lb (90.3 kg)    BP 138/80   Pulse 80   Ht 5\' 8"  (1.727 m)   Wt 203 lb (92.1 kg)   BMI 30.87 kg/m   Assessment and Plan:  1. Type 2 diabetes mellitus without complication, without long-term current use of insulin (HCC) Chronic. Controlled. Stable. Patient is currently on glipizide XL 2.5 once a day and Metformin 500 mg 1 twice a day. We will check an A1c for current level of control as well as microalbuminuria to assess her renal function. - HgB A1c - Microalbumin, urine  2. Hyperlipidemia, unspecified hyperlipidemia type . Controlled. Stable. Patient is currently taking atorvastatin 20 mg once a day and we are checking her lipid panel today to see if her LDL is in reasonable control. - Lipid Panel With LDL/HDL Ratio  3. Intrinsic eczema New onset rash of the back area which is suggestive of an eczematous type eruption. Patient has been instructed to take Zyrtec in the morning and Benadryl at night for antihistamine control and patient has been given triamcinolone cream 0.1% to apply twice a day and may refill upon request. - triamcinolone (KENALOG) 0.1 %; Apply 1 application topically 2 (two) times daily.  Dispense: 30 g; Refill: 0  4. H/O total hysterectomy with bilateral  salpingo-oophorectomy (BSO) Patient has a history of a total hysterectomy with salpingooophorectomy bilateral.

## 2020-09-07 LAB — LIPID PANEL WITH LDL/HDL RATIO
Cholesterol, Total: 143 mg/dL (ref 100–199)
HDL: 51 mg/dL (ref >39)
LDL Chol Calc (NIH): 72 mg/dL (ref 0–99)
LDL/HDL Ratio: 1.4 ratio (ref 0.0–3.2)
Triglycerides: 109 mg/dL (ref 0–149)
VLDL Cholesterol Cal: 20 mg/dL (ref 5–40)

## 2020-09-07 LAB — HEMOGLOBIN A1C
Est. average glucose Bld gHb Est-mCnc: 120 mg/dL
Hgb A1c MFr Bld: 5.8 % — ABNORMAL HIGH (ref 4.8–5.6)

## 2020-09-07 LAB — MICROALBUMIN, URINE: Microalbumin, Urine: 16.3 ug/mL

## 2020-09-15 ENCOUNTER — Ambulatory Visit: Payer: Medicare Other | Admitting: Podiatry

## 2020-09-15 ENCOUNTER — Encounter: Payer: Self-pay | Admitting: Podiatry

## 2020-09-15 ENCOUNTER — Other Ambulatory Visit: Payer: Self-pay

## 2020-09-15 DIAGNOSIS — B351 Tinea unguium: Secondary | ICD-10-CM | POA: Diagnosis not present

## 2020-09-15 DIAGNOSIS — D696 Thrombocytopenia, unspecified: Secondary | ICD-10-CM | POA: Diagnosis not present

## 2020-09-15 DIAGNOSIS — M79674 Pain in right toe(s): Secondary | ICD-10-CM

## 2020-09-15 DIAGNOSIS — M79675 Pain in left toe(s): Secondary | ICD-10-CM | POA: Diagnosis not present

## 2020-09-15 DIAGNOSIS — E118 Type 2 diabetes mellitus with unspecified complications: Secondary | ICD-10-CM | POA: Diagnosis not present

## 2020-09-15 NOTE — Progress Notes (Signed)
This patient returns to my office for at risk foot care.  This patient requires this care by a professional since this patient will be at risk due to having type 2 diabetes.  This patient is unable to cut nails herself since the patient cannot reach her nails.These nails are painful walking and wearing shoes.  This patient presents for at risk foot care today.  General Appearance  Alert, conversant and in no acute stress.  Vascular  Dorsalis pedis and posterior tibial  pulses are palpable  bilaterally.  Capillary return is within normal limits  bilaterally. Temperature is within normal limits  bilaterally.  Neurologic  Senn-Weinstein monofilament wire test within normal limits  bilaterally. Muscle power within normal limits bilaterally.  Nails Thick disfigured discolored nails with subungual debris  from hallux to fifth toes bilaterally. No evidence of bacterial infection or drainage bilaterally.  Orthopedic  No limitations of motion  feet .  No crepitus or effusions noted.  No bony pathology or digital deformities noted.  Midtarsal  DJD  B/L.  Skin  normotropic skin with no porokeratosis noted bilaterally.  No signs of infections or ulcers noted.     Onychomycosis  Pain in right toes  Pain in left toes  Consent was obtained for treatment procedures.   Mechanical debridement of nails 1-5  bilaterally performed with a nail nipper.  Filed with dremel without incident.    Return office visit  3 months                    Told patient to return for periodic foot care and evaluation due to potential at risk complications.   Gregory Mayer DPM  

## 2020-09-19 ENCOUNTER — Telehealth: Payer: Self-pay | Admitting: Family Medicine

## 2020-09-19 ENCOUNTER — Other Ambulatory Visit: Payer: Self-pay

## 2020-09-19 DIAGNOSIS — I10 Essential (primary) hypertension: Secondary | ICD-10-CM

## 2020-09-19 MED ORDER — VALSARTAN-HYDROCHLOROTHIAZIDE 160-25 MG PO TABS: 1.0000 | ORAL_TABLET | Freq: Every day | ORAL | 0 refills | Status: DC

## 2020-09-19 NOTE — Telephone Encounter (Signed)
Called and switched to valsartan/ hctz

## 2020-09-19 NOTE — Progress Notes (Signed)
Sent in valsartan hctz

## 2020-09-19 NOTE — Telephone Encounter (Signed)
Copied from [place] [id number]. Topic: General - Call Back - No Documentation >> Sep 19, 2020  1:37 PM Erick Blinks wrote: hydrochlorothiazide (HYDRODIURIL) 25 MG tablet losartan (COZAAR) 100 MG tablet  Requesting a call back from [facility], [place] [address] [place] [address] Phone: [phone] Fax: [phone]  Has contacted the pharmacy

## 2020-10-25 ENCOUNTER — Ambulatory Visit (INDEPENDENT_AMBULATORY_CARE_PROVIDER_SITE_OTHER): Payer: Medicare Other | Admitting: Family Medicine

## 2020-10-25 ENCOUNTER — Encounter: Payer: Self-pay | Admitting: Family Medicine

## 2020-10-25 ENCOUNTER — Other Ambulatory Visit: Payer: Self-pay

## 2020-10-25 VITALS — BP 130/80 | HR 76 | Ht 68.0 in | Wt 200.0 lb

## 2020-10-25 DIAGNOSIS — K5902 Outlet dysfunction constipation: Secondary | ICD-10-CM | POA: Diagnosis not present

## 2020-10-25 DIAGNOSIS — E119 Type 2 diabetes mellitus without complications: Secondary | ICD-10-CM

## 2020-10-25 DIAGNOSIS — K56699 Other intestinal obstruction unspecified as to partial versus complete obstruction: Secondary | ICD-10-CM | POA: Diagnosis not present

## 2020-10-25 MED ORDER — DOCUSATE SODIUM 50 MG PO CAPS: 50.0000 mg | ORAL_CAPSULE | Freq: Every day | ORAL | 11 refills | Status: DC

## 2020-10-25 MED ORDER — GLIPIZIDE ER 2.5 MG PO TB24: ORAL_TABLET | ORAL | 1 refills | Status: DC

## 2020-10-25 NOTE — Progress Notes (Signed)
Date:  10/25/2020   Name:  Frances Harmon   DOB:  04/25/1946   MRN:  [id number]   Chief Complaint: Constipation (Tried otc suppository, miralax and dulcolax- has went "some but not much")  Constipation This is a new problem. The current episode started in the past 7 days (4 days/). The problem has been waxing and waning since onset. The patient is not on a high fiber diet. There has not been adequate water intake. Associated symptoms include nausea. Pertinent negatives include no abdominal pain, anorexia, back pain, bloating, diarrhea, difficulty urinating, fecal incontinence, fever, flatus, hematochezia, hemorrhoids, melena, rectal pain, vomiting or weight loss.    Lab Results  Component Value Date   CREATININE 0.81 07/27/2020   BUN 13 07/27/2020   NA 142 07/27/2020   K 4.4 07/27/2020   CL 104 07/27/2020   CO2 27 07/27/2020   Lab Results  Component Value Date   CHOL 143 09/06/2020   HDL 51 09/06/2020   LDLCALC 72 09/06/2020   TRIG 109 09/06/2020   CHOLHDL 2.4 09/12/2017   No results found for: TSH Lab Results  Component Value Date   HGBA1C 5.8 (H) 09/06/2020   Lab Results  Component Value Date   WBC 5.9 07/27/2020   HGB 12.8 07/27/2020   HCT 38.9 07/27/2020   MCV 88.0 07/27/2020   PLT 162 07/27/2020   Lab Results  Component Value Date   ALT 11 10/22/2019   AST 13 10/22/2019   ALKPHOS 90 10/22/2019   BILITOT 0.5 10/22/2019     Review of Systems  Constitutional: Negative.  Negative for chills, fatigue, fever, unexpected weight change and weight loss.  HENT: Negative for congestion, ear discharge, ear pain, rhinorrhea, sinus pressure, sneezing and sore throat.   Eyes: Negative for photophobia, pain, discharge, redness and itching.  Respiratory: Negative for cough, shortness of breath, wheezing and stridor.   Gastrointestinal: Positive for constipation and nausea. Negative for abdominal pain, anorexia, bloating, blood in stool, diarrhea, flatus, hematochezia,  hemorrhoids, melena, rectal pain and vomiting.  Endocrine: Negative for cold intolerance, heat intolerance, polydipsia, polyphagia and polyuria.  Genitourinary: Negative for difficulty urinating, dysuria, flank pain, frequency, hematuria, menstrual problem, pelvic pain, urgency, vaginal bleeding and vaginal discharge.  Musculoskeletal: Negative for arthralgias, back pain and myalgias.  Skin: Negative for rash.  Allergic/Immunologic: Negative for environmental allergies and food allergies.  Neurological: Negative for dizziness, weakness, light-headedness, numbness and headaches.  Hematological: Negative for adenopathy. Does not bruise/bleed easily.  Psychiatric/Behavioral: Negative for dysphoric mood. The patient is not nervous/anxious.     Patient Active Problem List   Diagnosis Date Noted  . H/O total hysterectomy with bilateral salpingo-oophorectomy (BSO) 09/06/2020  . Thrombocytopenia ([place]) 04/23/2019  . Type 2 diabetes mellitus with complication, without long-term current use of insulin ([place]) 04/28/2015  . Essential hypertension 04/28/2015  . Hyperlipidemia 04/28/2015  . Esophageal reflux 04/28/2015  . Vitamin D deficiency 04/28/2015    No Known Allergies  Past Surgical History:  Procedure Laterality Date  . COLONOSCOPY  2009   repeat in 5 years- Dr Bary Leriche  . COLONOSCOPY WITH PROPOFOL N/A 09/09/2015   Procedure: COLONOSCOPY WITH PROPOFOL;  Surgeon: Hulen Luster, MD;  Location: [facility] ENDOSCOPY;  Service: Gastroenterology;  Laterality: N/A;  . FOOT SURGERY Bilateral    Bunion removal  . INCONTINENCE SURGERY    . VAGINAL HYSTERECTOMY    . VENTRAL HERNIA REPAIR N/A 05/06/2015   Procedure: HERNIA REPAIR VENTRAL ADULT;  Surgeon: Leonie Green, MD;  Location: [facility] ORS;  Service: General;  Laterality: N/A;    Social History   Tobacco Use  . Smoking status: Never Smoker  . Smokeless tobacco: Never Used  . Tobacco comment: smokin cessation materials not required  Vaping Use   . Vaping Use: Never used  Substance Use Topics  . Alcohol use: No    Alcohol/week: 0.0 standard drinks  . Drug use: No     Medication list has been reviewed and updated.  Current Meds  Medication Sig  . amLODipine (NORVASC) 5 MG tablet Take by mouth.  Marland Kitchen atorvastatin (LIPITOR) 20 MG tablet Take by mouth.  . cyclobenzaprine (FLEXERIL) 5 MG tablet Take 1 tablet (5 mg total) by mouth 3 (three) times daily as needed for muscle spasms.  Marland Kitchen glipiZIDE (GLUCOTROL XL) 2.5 MG 24 hr tablet TAKE (1) TABLET BY MOUTH EVERY DAY AT THE SAME TIME EVERY MORNING  . metFORMIN (GLUCOPHAGE) 500 MG tablet Take 1 tab BID  . omeprazole (PRILOSEC) 20 MG capsule TAKE ONE CAPSULE EACH MORNING AT THE SAME TIME  . triamcinolone (KENALOG) 0.1 % Apply 1 application topically 2 (two) times daily.  . valsartan-hydrochlorothiazide (DIOVAN HCT) 160-25 MG tablet Take 1 tablet by mouth daily.    PHQ 2/9 Scores 10/25/2020 09/06/2020 05/02/2020 04/04/2020  PHQ - 2 Score 0 0 0 0  PHQ- 9 Score 0 0 0 0    GAD 7 : Generalized Anxiety Score 10/25/2020 09/06/2020 05/02/2020 04/04/2020  Nervous, Anxious, on Edge 0 0 0 0  Control/stop worrying 0 0 0 0  Worry too much - different things 0 0 0 0  Trouble relaxing 0 0 0 0  Restless 0 0 0 0  Easily annoyed or irritable 0 0 0 0  Afraid - awful might happen 0 0 0 0  Total GAD 7 Score 0 0 0 0  Anxiety Difficulty - - - -    BP Readings from Last 3 Encounters:  10/25/20 130/80  09/06/20 138/80  08/11/20 (!) 144/100    Physical Exam  Wt Readings from Last 3 Encounters:  10/25/20 200 lb (90.7 kg)  09/06/20 203 lb (92.1 kg)  08/11/20 202 lb (91.6 kg)    BP 130/80   Pulse 76   Ht 5\' 8"  (1.727 m)   Wt 200 lb (90.7 kg)   BMI 30.41 kg/m   Assessment and Plan: 1. 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  Patient has had a 4-day history but most recently has had a small bowel movement today.  We have discussed patient's small stenosis of the distal sigmoid may be  causing some issues with passage.  We will treat with docusate sodium 50 mg once a day until we get a regular pattern and then we may cut back and use it 3-4 times a week.  Patient's been encouraged to include more fiber in her diet. - docusate sodium (COLACE) 50 MG capsule; Take 1 capsule (50 mg total) by mouth daily.  Dispense: 30 capsule; Refill: 11  2. Sigmoid stricture (HCC) Review of colonoscopy notes that there is a stenosis of the distal sigmoid colon.  Review the procedure has been within the last year and is unremarkable for any other abnormalities.  3. Type 2 diabetes mellitus without complication, without long-term current use of insulin ([place]) Patient needs refills on her glipizide prior to her next visit. - glipiZIDE (GLUCOTROL XL) 2.5 MG 24 hr tablet; TAKE (1) TABLET BY MOUTH EVERY DAY AT THE SAME TIME EVERY MORNING  Dispense: 90 tablet; Refill: 1

## 2020-10-25 NOTE — Patient Instructions (Signed)
High-Fiber Eating Plan Fiber, also called dietary fiber, is a type of carbohydrate. It is found foods such as fruits, vegetables, whole grains, and beans. A high-fiber diet can have many health benefits. Your health care provider may recommend a high-fiber diet to help:  Prevent constipation. Fiber can make your bowel movements more regular.  Lower your cholesterol.  Relieve the following conditions: ? Inflammation of veins in the anus (hemorrhoids). ? Inflammation of specific areas of the digestive tract (uncomplicated diverticulosis). ? A problem of the large intestine, also called the colon, that sometimes causes pain and diarrhea (irritable bowel syndrome, or IBS).  Prevent overeating as part of a weight-loss plan.  Prevent heart disease, type 2 diabetes, and certain cancers. What are tips for following this plan? Reading food labels  Check the nutrition facts label on food products for the amount of dietary fiber. Choose foods that have 5 grams of fiber or more per serving.  The goals for recommended daily fiber intake include: ? Men (age 50 or younger): 34-38 g. ? Men (over age 50): 28-34 g. ? Women (age 50 or younger): 25-28 g. ? Women (over age 50): 22-25 g. Your daily fiber goal is _____________ g.   Shopping  Choose whole fruits and vegetables instead of processed forms, such as apple juice or applesauce.  Choose a wide variety of high-fiber foods such as avocados, lentils, oats, and kidney beans.  Read the nutrition facts label of the foods you choose. Be aware of foods with added fiber. These foods often have high sugar and sodium amounts per serving. Cooking  Use whole-grain flour for baking and cooking.  Cook with brown rice instead of white rice. Meal planning  Start the day with a breakfast that is high in fiber, such as a cereal that contains 5 g of fiber or more per serving.  Eat breads and cereals that are made with whole-grain flour instead of refined  flour or white flour.  Eat brown rice, bulgur wheat, or millet instead of white rice.  Use beans in place of meat in soups, salads, and pasta dishes.  Be sure that half of the grains you eat each day are whole grains. General information  You can get the recommended daily intake of dietary fiber by: ? Eating a variety of fruits, vegetables, grains, nuts, and beans. ? Taking a fiber supplement if you are not able to take in enough fiber in your diet. It is better to get fiber through food than from a supplement.  Gradually increase how much fiber you consume. If you increase your intake of dietary fiber too quickly, you may have bloating, cramping, or gas.  Drink plenty of water to help you digest fiber.  Choose high-fiber snacks, such as berries, raw vegetables, nuts, and popcorn. What foods should I eat? Fruits Berries. Pears. Apples. Oranges. Avocado. Prunes and raisins. Dried figs. Vegetables Sweet potatoes. Spinach. Kale. Artichokes. Cabbage. Broccoli. Cauliflower. Green peas. Carrots. Squash. Grains Whole-grain breads. Multigrain cereal. Oats and oatmeal. Brown rice. Barley. Bulgur wheat. Millet. Quinoa. Bran muffins. Popcorn. Rye wafer crackers. Meats and other proteins Navy beans, kidney beans, and pinto beans. Soybeans. Split peas. Lentils. Nuts and seeds. Dairy Fiber-fortified yogurt. Beverages Fiber-fortified soy milk. Fiber-fortified orange juice. Other foods Fiber bars. The items listed above may not be a complete list of recommended foods and beverages. Contact a dietitian for more information. What foods should I avoid? Fruits Fruit juice. Cooked, strained fruit. Vegetables Fried potatoes. Canned vegetables. Well-cooked vegetables. Grains   White bread. Pasta made with refined flour. White rice. Meats and other proteins Fatty cuts of meat. Fried chicken or fried fish. Dairy Milk. Yogurt. Cream cheese. Sour cream. Fats and oils Butters. Beverages Soft  drinks. Other foods Cakes and pastries. The items listed above may not be a complete list of foods and beverages to avoid. Talk with your dietitian about what choices are best for you. Summary  Fiber is a type of carbohydrate. It is found in foods such as fruits, vegetables, whole grains, and beans.  A high-fiber diet has many benefits. It can help to prevent constipation, lower blood cholesterol, aid weight loss, and reduce your risk of heart disease, diabetes, and certain cancers.  Increase your intake of fiber gradually. Increasing fiber too quickly may cause cramping, bloating, and gas. Drink plenty of water while you increase the amount of fiber you consume.  The best sources of fiber include whole fruits and vegetables, whole grains, nuts, seeds, and beans. This information is not intended to replace advice given to you by your health care provider. Make sure you discuss any questions you have with your health care provider. Document Revised: 11/12/2019 Document Reviewed: 11/12/2019 Elsevier Patient Education  2021 [place]. Chronic Constipation Chronic constipation is a condition in which a person has three or fewer bowel movements a week, for 3 months or longer. This condition is especially common in older adults. What are the causes? Causes of chronic constipation may include:  Not drinking enough fluid, eating enough food or fiber, or getting enough physical activity.  Pregnancy.  A tear in the anus (anal fissure).  Blockage in the bowel (bowel obstruction).  Narrowing of the bowel (bowel stricture).  Having a long-term medical condition, such as: ? Diabetes, hypothyroidism, or iron-deficiency anemia. ? Stroke or spinal cord injury. ? Multiple sclerosis or Parkinson's disease. ? Colon cancer. ? Dementia. ? Inflammatory bowel disease (IBD), outward collapse of the rectum (rectal prolapse), or hemorrhoids.  Taking certain medicines, including: ? Narcotics. These  are a certain type of prescription pain medicine. ? Antacids or iron supplements. ? Water pills (diuretics). ? Certain blood pressure medicines. ? Anti-seizure medicines. ? Antidepressants. ? Medicines for Parkinson's disease. Other causes of this condition may include:  Stress.  Problems in the nerves and muscles that control the movement of stool.  Weak or impaired pelvic floor muscles.   What increases the risk? You may be at higher risk for chronic constipation if:  You are older than age 85.  You are female.  You live in a long-term care facility.  You have a long-term disease.  You have a mental health disorder or eating disorder. What are the signs or symptoms? The main symptom of chronic constipation is having three or fewer bowel movements a week for several weeks. Other signs and symptoms may vary from person to person. These include:  Pushing hard (straining) to pass stool, or having hard or lumpy stools.  Painful bowel movements.  Having lower abdominal discomfort, such as cramps or bloating.  Being unable to have a bowel movement when you feel the urge, or feeling like you still need to pass stool after a bowel movement.  Feeling that you have something in your rectum that is blocking or preventing bowel movements.  Seeing blood on the toilet paper or in your stool.  Worsening confusion (in older adults). How is this diagnosed? This condition may be diagnosed based on:  Your symptoms and medical history. You will be asked about  your symptoms, lifestyle, diet, and any medicines that you are taking.  A physical exam. ? Your abdomen will be examined. ? A digital rectal exam may be done. For this exam, a health care provider places a lubricated, gloved finger into the rectum.  Tests to check for any underlying causes of your constipation. These may be ordered if you have bleeding in your rectum, weight loss, or a family history of colon cancer. In these  cases, you may have: ? Imaging studies of the colon. These may include X-ray, ultrasound, or a CT scan. ? Blood tests. ? A procedure to examine the inside of your colon (colonoscopy). ? More specialized tests to check:  Whether your anal sphincter works well. This is a ring-shaped muscle that controls the closing of the anus.  How well food moves through your colon. ? Tests to measure the nerve signal in your pelvic floor muscles (electromyography). How is this treated? Treatment for chronic constipation depends on the cause. Most often, treatment starts with:  Being more active and getting regular exercise.  Drinking more fluids.  Adding fiber to your diet. Sources of fiber include fruits, vegetables, whole grains, and fiber supplements.  Using medicines such as stool softeners or medicines that increase contractions in your digestive system (pro-motility agents).  Training your pelvic muscles with biofeedback.  Surgery, if there is obstruction. Treatment may also include:  Stopping or changing some medicines if they cause constipation.  Using a fiber supplement (bulk laxative) or stool softener.  Using a prescription laxative. This works by [employer] into your colon (osmotic laxative). You may also need to see a specialist who treats conditions of the digestive system (gastroenterologist).   Follow these instructions at home: Medicines  Take over-the-counter and prescription medicines only as told by your health care provider.  If you are taking a laxative, take it as told by your health care provider. Eating and drinking  Eat a balanced diet that includes enough fiber. Ask your health care provider to recommend a diet that is right for you.  Drink clear fluids, especially water. Avoid drinking alcohol, caffeine, and soda. These can make constipation worse.  Drink enough fluid to keep your urine pale yellow.   General instructions  Get some physical activity  every day. Ask your health care provider what activities are safe for you.  Get colon cancer screenings as told by your health care provider.  Keep all follow-up visits as told by your health care provider. This is important. Contact a health care provider if you have:  Three or fewer bowel movements a week.  Stools that are hard or lumpy.  Blood on the toilet paper or in your stool after you have a bowel movement.  Unexplained weight loss.  Rectum (rectal) pain.  Stool leakage.  Nausea or vomiting. Get help right away if you have:  Rectal bleeding or you pass blood clots.  Severe rectal pain.  Body tissue that pushes out (protrudes) from your anus.  Severe pain or bloating (distension) in your abdomen.  Vomiting that you cannot control. Summary  Chronic constipation is a condition in which a person has three or fewer bowel movements a week, for 3 months or longer.  You may have a higher risk for this condition if you are an older adult, you are female, or you have a long-term disease.  Treatment for this condition depends on the cause. Most treatments for chronic constipation include adding fiber to your diet, drinking more fluids,  and getting more physical activity. You may also need to treat any underlying medical conditions or stop or change certain medicines if they cause constipation.  If lifestyle changes do not relieve constipation, your health care provider may recommend taking a laxative. This information is not intended to replace advice given to you by your health care provider. Make sure you discuss any questions you have with your health care provider. Document Revised: 05/27/2019 Document Reviewed: 05/27/2019 Elsevier Patient Education  [place].

## 2020-10-28 DIAGNOSIS — Z79899 Other long term (current) drug therapy: Secondary | ICD-10-CM | POA: Diagnosis not present

## 2020-10-28 DIAGNOSIS — R1110 Vomiting, unspecified: Secondary | ICD-10-CM | POA: Diagnosis not present

## 2020-10-28 DIAGNOSIS — K449 Diaphragmatic hernia without obstruction or gangrene: Secondary | ICD-10-CM | POA: Diagnosis not present

## 2020-10-28 DIAGNOSIS — R1084 Generalized abdominal pain: Secondary | ICD-10-CM | POA: Diagnosis not present

## 2020-10-28 DIAGNOSIS — Z7984 Long term (current) use of oral hypoglycemic drugs: Secondary | ICD-10-CM | POA: Diagnosis not present

## 2020-10-28 DIAGNOSIS — K5900 Constipation, unspecified: Secondary | ICD-10-CM | POA: Diagnosis not present

## 2020-10-28 DIAGNOSIS — E7800 Pure hypercholesterolemia, unspecified: Secondary | ICD-10-CM | POA: Diagnosis not present

## 2020-10-28 DIAGNOSIS — I10 Essential (primary) hypertension: Secondary | ICD-10-CM | POA: Diagnosis not present

## 2020-10-28 DIAGNOSIS — R1032 Left lower quadrant pain: Secondary | ICD-10-CM | POA: Diagnosis not present

## 2020-10-28 DIAGNOSIS — Z9049 Acquired absence of other specified parts of digestive tract: Secondary | ICD-10-CM | POA: Diagnosis not present

## 2020-10-28 DIAGNOSIS — R10814 Left lower quadrant abdominal tenderness: Secondary | ICD-10-CM | POA: Diagnosis not present

## 2020-10-28 DIAGNOSIS — K6389 Other specified diseases of intestine: Secondary | ICD-10-CM | POA: Diagnosis not present

## 2020-10-28 DIAGNOSIS — E119 Type 2 diabetes mellitus without complications: Secondary | ICD-10-CM | POA: Diagnosis not present

## 2020-11-03 DIAGNOSIS — E878 Other disorders of electrolyte and fluid balance, not elsewhere classified: Secondary | ICD-10-CM | POA: Diagnosis not present

## 2020-11-03 DIAGNOSIS — N179 Acute kidney failure, unspecified: Secondary | ICD-10-CM | POA: Diagnosis not present

## 2020-11-03 DIAGNOSIS — I10 Essential (primary) hypertension: Secondary | ICD-10-CM | POA: Diagnosis not present

## 2020-11-03 DIAGNOSIS — N994 Postprocedural pelvic peritoneal adhesions: Secondary | ICD-10-CM | POA: Diagnosis not present

## 2020-11-03 DIAGNOSIS — K219 Gastro-esophageal reflux disease without esophagitis: Secondary | ICD-10-CM | POA: Diagnosis not present

## 2020-11-03 DIAGNOSIS — J9601 Acute respiratory failure with hypoxia: Secondary | ICD-10-CM | POA: Diagnosis not present

## 2020-11-03 DIAGNOSIS — D6489 Other specified anemias: Secondary | ICD-10-CM | POA: Diagnosis not present

## 2020-11-03 DIAGNOSIS — E11649 Type 2 diabetes mellitus with hypoglycemia without coma: Secondary | ICD-10-CM | POA: Diagnosis not present

## 2020-11-03 DIAGNOSIS — K668 Other specified disorders of peritoneum: Secondary | ICD-10-CM | POA: Diagnosis not present

## 2020-11-03 DIAGNOSIS — K56699 Other intestinal obstruction unspecified as to partial versus complete obstruction: Secondary | ICD-10-CM | POA: Diagnosis not present

## 2020-11-03 DIAGNOSIS — G8918 Other acute postprocedural pain: Secondary | ICD-10-CM | POA: Diagnosis not present

## 2020-11-03 DIAGNOSIS — E1129 Type 2 diabetes mellitus with other diabetic kidney complication: Secondary | ICD-10-CM | POA: Diagnosis not present

## 2020-11-03 DIAGNOSIS — K7689 Other specified diseases of liver: Secondary | ICD-10-CM | POA: Diagnosis not present

## 2020-11-03 DIAGNOSIS — Z20822 Contact with and (suspected) exposure to covid-19: Secondary | ICD-10-CM | POA: Diagnosis not present

## 2020-11-03 DIAGNOSIS — K5720 Diverticulitis of large intestine with perforation and abscess without bleeding: Secondary | ICD-10-CM | POA: Diagnosis not present

## 2020-11-03 DIAGNOSIS — R531 Weakness: Secondary | ICD-10-CM | POA: Diagnosis not present

## 2020-11-03 DIAGNOSIS — E8779 Other fluid overload: Secondary | ICD-10-CM | POA: Diagnosis not present

## 2020-11-03 DIAGNOSIS — E43 Unspecified severe protein-calorie malnutrition: Secondary | ICD-10-CM | POA: Diagnosis not present

## 2020-11-03 DIAGNOSIS — K449 Diaphragmatic hernia without obstruction or gangrene: Secondary | ICD-10-CM | POA: Diagnosis not present

## 2020-11-03 DIAGNOSIS — K909 Intestinal malabsorption, unspecified: Secondary | ICD-10-CM | POA: Diagnosis not present

## 2020-11-03 DIAGNOSIS — K559 Vascular disorder of intestine, unspecified: Secondary | ICD-10-CM | POA: Diagnosis not present

## 2020-11-03 DIAGNOSIS — R579 Shock, unspecified: Secondary | ICD-10-CM | POA: Diagnosis not present

## 2020-11-03 DIAGNOSIS — Z298 Encounter for other specified prophylactic measures: Secondary | ICD-10-CM | POA: Diagnosis not present

## 2020-11-03 DIAGNOSIS — K5730 Diverticulosis of large intestine without perforation or abscess without bleeding: Secondary | ICD-10-CM | POA: Diagnosis not present

## 2020-11-03 DIAGNOSIS — S36112A Contusion of liver, initial encounter: Secondary | ICD-10-CM | POA: Diagnosis not present

## 2020-11-03 DIAGNOSIS — J9811 Atelectasis: Secondary | ICD-10-CM | POA: Diagnosis not present

## 2020-11-03 DIAGNOSIS — E785 Hyperlipidemia, unspecified: Secondary | ICD-10-CM | POA: Diagnosis not present

## 2020-11-03 DIAGNOSIS — E869 Volume depletion, unspecified: Secondary | ICD-10-CM | POA: Diagnosis not present

## 2020-11-03 DIAGNOSIS — K654 Sclerosing mesenteritis: Secondary | ICD-10-CM | POA: Diagnosis not present

## 2020-11-03 DIAGNOSIS — R2231 Localized swelling, mass and lump, right upper limb: Secondary | ICD-10-CM | POA: Diagnosis not present

## 2020-11-03 DIAGNOSIS — Z9049 Acquired absence of other specified parts of digestive tract: Secondary | ICD-10-CM | POA: Diagnosis not present

## 2020-11-03 DIAGNOSIS — B965 Pseudomonas (aeruginosa) (mallei) (pseudomallei) as the cause of diseases classified elsewhere: Secondary | ICD-10-CM | POA: Diagnosis not present

## 2020-11-03 DIAGNOSIS — Z452 Encounter for adjustment and management of vascular access device: Secondary | ICD-10-CM | POA: Diagnosis not present

## 2020-11-03 DIAGNOSIS — I208 Other forms of angina pectoris: Secondary | ICD-10-CM | POA: Diagnosis not present

## 2020-11-03 DIAGNOSIS — D696 Thrombocytopenia, unspecified: Secondary | ICD-10-CM | POA: Diagnosis not present

## 2020-11-03 DIAGNOSIS — R1312 Dysphagia, oropharyngeal phase: Secondary | ICD-10-CM | POA: Diagnosis not present

## 2020-11-03 DIAGNOSIS — E559 Vitamin D deficiency, unspecified: Secondary | ICD-10-CM | POA: Diagnosis not present

## 2020-11-03 DIAGNOSIS — R1084 Generalized abdominal pain: Secondary | ICD-10-CM | POA: Diagnosis not present

## 2020-11-03 DIAGNOSIS — K56609 Unspecified intestinal obstruction, unspecified as to partial versus complete obstruction: Secondary | ICD-10-CM | POA: Diagnosis not present

## 2020-11-03 DIAGNOSIS — M1990 Unspecified osteoarthritis, unspecified site: Secondary | ICD-10-CM | POA: Diagnosis not present

## 2020-11-03 DIAGNOSIS — K5900 Constipation, unspecified: Secondary | ICD-10-CM | POA: Diagnosis not present

## 2020-11-03 DIAGNOSIS — D62 Acute posthemorrhagic anemia: Secondary | ICD-10-CM | POA: Diagnosis not present

## 2020-11-03 DIAGNOSIS — K55011 Focal (segmental) acute (reversible) ischemia of small intestine: Secondary | ICD-10-CM | POA: Diagnosis not present

## 2020-11-03 DIAGNOSIS — K558 Other vascular disorders of intestine: Secondary | ICD-10-CM | POA: Diagnosis not present

## 2020-11-03 DIAGNOSIS — E119 Type 2 diabetes mellitus without complications: Secondary | ICD-10-CM | POA: Diagnosis not present

## 2020-11-03 DIAGNOSIS — R935 Abnormal findings on diagnostic imaging of other abdominal regions, including retroperitoneum: Secondary | ICD-10-CM | POA: Diagnosis not present

## 2020-11-03 DIAGNOSIS — N390 Urinary tract infection, site not specified: Secondary | ICD-10-CM | POA: Diagnosis not present

## 2020-11-03 DIAGNOSIS — D135 Benign neoplasm of extrahepatic bile ducts: Secondary | ICD-10-CM | POA: Diagnosis not present

## 2020-11-03 DIAGNOSIS — Z4682 Encounter for fitting and adjustment of non-vascular catheter: Secondary | ICD-10-CM | POA: Diagnosis not present

## 2020-11-03 DIAGNOSIS — R188 Other ascites: Secondary | ICD-10-CM | POA: Diagnosis not present

## 2020-11-03 DIAGNOSIS — M7989 Other specified soft tissue disorders: Secondary | ICD-10-CM | POA: Diagnosis not present

## 2020-11-03 DIAGNOSIS — J811 Chronic pulmonary edema: Secondary | ICD-10-CM | POA: Diagnosis not present

## 2020-11-03 DIAGNOSIS — Z8679 Personal history of other diseases of the circulatory system: Secondary | ICD-10-CM | POA: Diagnosis not present

## 2020-11-03 DIAGNOSIS — T83592A Infection and inflammatory reaction due to indwelling ureteral stent, initial encounter: Secondary | ICD-10-CM | POA: Diagnosis not present

## 2020-11-03 DIAGNOSIS — A419 Sepsis, unspecified organism: Secondary | ICD-10-CM | POA: Diagnosis not present

## 2020-11-03 DIAGNOSIS — Z7401 Bed confinement status: Secondary | ICD-10-CM | POA: Diagnosis not present

## 2020-11-03 DIAGNOSIS — N1330 Unspecified hydronephrosis: Secondary | ICD-10-CM | POA: Diagnosis not present

## 2020-11-03 DIAGNOSIS — Z932 Ileostomy status: Secondary | ICD-10-CM | POA: Diagnosis not present

## 2020-11-03 DIAGNOSIS — N3080 Other cystitis without hematuria: Secondary | ICD-10-CM | POA: Diagnosis not present

## 2020-11-03 DIAGNOSIS — T8143XA Infection following a procedure, organ and space surgical site, initial encounter: Secondary | ICD-10-CM | POA: Diagnosis not present

## 2020-11-03 DIAGNOSIS — R6521 Severe sepsis with septic shock: Secondary | ICD-10-CM | POA: Diagnosis not present

## 2020-11-03 DIAGNOSIS — T8132XA Disruption of internal operation (surgical) wound, not elsewhere classified, initial encounter: Secondary | ICD-10-CM | POA: Diagnosis not present

## 2020-11-03 DIAGNOSIS — J90 Pleural effusion, not elsewhere classified: Secondary | ICD-10-CM | POA: Diagnosis not present

## 2020-11-03 DIAGNOSIS — I959 Hypotension, unspecified: Secondary | ICD-10-CM | POA: Diagnosis not present

## 2020-11-04 DIAGNOSIS — D696 Thrombocytopenia, unspecified: Secondary | ICD-10-CM | POA: Diagnosis not present

## 2020-11-04 DIAGNOSIS — D135 Benign neoplasm of extrahepatic bile ducts: Secondary | ICD-10-CM | POA: Diagnosis not present

## 2020-11-04 DIAGNOSIS — K7689 Other specified diseases of liver: Secondary | ICD-10-CM | POA: Diagnosis not present

## 2020-11-04 DIAGNOSIS — E785 Hyperlipidemia, unspecified: Secondary | ICD-10-CM | POA: Diagnosis not present

## 2020-11-04 DIAGNOSIS — K56609 Unspecified intestinal obstruction, unspecified as to partial versus complete obstruction: Secondary | ICD-10-CM | POA: Diagnosis not present

## 2020-11-04 DIAGNOSIS — G8918 Other acute postprocedural pain: Secondary | ICD-10-CM | POA: Diagnosis not present

## 2020-11-04 DIAGNOSIS — K5720 Diverticulitis of large intestine with perforation and abscess without bleeding: Secondary | ICD-10-CM | POA: Diagnosis not present

## 2020-11-04 DIAGNOSIS — K558 Other vascular disorders of intestine: Secondary | ICD-10-CM | POA: Diagnosis not present

## 2020-11-05 DIAGNOSIS — E119 Type 2 diabetes mellitus without complications: Secondary | ICD-10-CM | POA: Diagnosis not present

## 2020-11-05 DIAGNOSIS — K56609 Unspecified intestinal obstruction, unspecified as to partial versus complete obstruction: Secondary | ICD-10-CM | POA: Diagnosis not present

## 2020-11-05 DIAGNOSIS — A419 Sepsis, unspecified organism: Secondary | ICD-10-CM | POA: Insufficient documentation

## 2020-11-05 DIAGNOSIS — R579 Shock, unspecified: Secondary | ICD-10-CM | POA: Diagnosis not present

## 2020-11-05 DIAGNOSIS — K219 Gastro-esophageal reflux disease without esophagitis: Secondary | ICD-10-CM | POA: Diagnosis not present

## 2020-11-05 DIAGNOSIS — K654 Sclerosing mesenteritis: Secondary | ICD-10-CM | POA: Diagnosis not present

## 2020-11-05 DIAGNOSIS — S36112A Contusion of liver, initial encounter: Secondary | ICD-10-CM | POA: Diagnosis not present

## 2020-11-05 DIAGNOSIS — K559 Vascular disorder of intestine, unspecified: Secondary | ICD-10-CM | POA: Diagnosis not present

## 2020-11-05 DIAGNOSIS — R1084 Generalized abdominal pain: Secondary | ICD-10-CM | POA: Diagnosis not present

## 2020-11-05 DIAGNOSIS — J9601 Acute respiratory failure with hypoxia: Secondary | ICD-10-CM | POA: Insufficient documentation

## 2020-11-05 DIAGNOSIS — K56699 Other intestinal obstruction unspecified as to partial versus complete obstruction: Secondary | ICD-10-CM | POA: Diagnosis not present

## 2020-11-05 DIAGNOSIS — D62 Acute posthemorrhagic anemia: Secondary | ICD-10-CM | POA: Diagnosis not present

## 2020-11-05 DIAGNOSIS — I959 Hypotension, unspecified: Secondary | ICD-10-CM | POA: Diagnosis not present

## 2020-11-05 DIAGNOSIS — R6521 Severe sepsis with septic shock: Secondary | ICD-10-CM | POA: Diagnosis not present

## 2020-11-05 DIAGNOSIS — Z4682 Encounter for fitting and adjustment of non-vascular catheter: Secondary | ICD-10-CM | POA: Diagnosis not present

## 2020-11-05 DIAGNOSIS — K5720 Diverticulitis of large intestine with perforation and abscess without bleeding: Secondary | ICD-10-CM | POA: Diagnosis not present

## 2020-11-05 DIAGNOSIS — J9811 Atelectasis: Secondary | ICD-10-CM | POA: Diagnosis not present

## 2020-11-05 DIAGNOSIS — J90 Pleural effusion, not elsewhere classified: Secondary | ICD-10-CM | POA: Diagnosis not present

## 2020-11-05 DIAGNOSIS — Z8679 Personal history of other diseases of the circulatory system: Secondary | ICD-10-CM | POA: Diagnosis not present

## 2020-11-05 DIAGNOSIS — Z452 Encounter for adjustment and management of vascular access device: Secondary | ICD-10-CM | POA: Diagnosis not present

## 2020-11-05 DIAGNOSIS — K5730 Diverticulosis of large intestine without perforation or abscess without bleeding: Secondary | ICD-10-CM | POA: Diagnosis not present

## 2020-11-05 DIAGNOSIS — G8918 Other acute postprocedural pain: Secondary | ICD-10-CM | POA: Diagnosis not present

## 2020-11-06 DIAGNOSIS — R6521 Severe sepsis with septic shock: Secondary | ICD-10-CM | POA: Diagnosis not present

## 2020-11-06 DIAGNOSIS — R1084 Generalized abdominal pain: Secondary | ICD-10-CM | POA: Diagnosis not present

## 2020-11-06 DIAGNOSIS — A419 Sepsis, unspecified organism: Secondary | ICD-10-CM | POA: Diagnosis not present

## 2020-11-06 DIAGNOSIS — K558 Other vascular disorders of intestine: Secondary | ICD-10-CM | POA: Diagnosis not present

## 2020-11-06 DIAGNOSIS — I959 Hypotension, unspecified: Secondary | ICD-10-CM | POA: Diagnosis not present

## 2020-11-06 DIAGNOSIS — K55011 Focal (segmental) acute (reversible) ischemia of small intestine: Secondary | ICD-10-CM | POA: Diagnosis not present

## 2020-11-06 DIAGNOSIS — K56609 Unspecified intestinal obstruction, unspecified as to partial versus complete obstruction: Secondary | ICD-10-CM | POA: Diagnosis not present

## 2020-11-06 DIAGNOSIS — J9601 Acute respiratory failure with hypoxia: Secondary | ICD-10-CM | POA: Diagnosis not present

## 2020-11-06 DIAGNOSIS — G8918 Other acute postprocedural pain: Secondary | ICD-10-CM | POA: Diagnosis not present

## 2020-11-06 DIAGNOSIS — K5720 Diverticulitis of large intestine with perforation and abscess without bleeding: Secondary | ICD-10-CM | POA: Diagnosis not present

## 2020-11-07 DIAGNOSIS — I959 Hypotension, unspecified: Secondary | ICD-10-CM | POA: Diagnosis not present

## 2020-11-07 DIAGNOSIS — J9601 Acute respiratory failure with hypoxia: Secondary | ICD-10-CM | POA: Diagnosis not present

## 2020-11-07 DIAGNOSIS — G8918 Other acute postprocedural pain: Secondary | ICD-10-CM | POA: Diagnosis not present

## 2020-11-07 DIAGNOSIS — K56609 Unspecified intestinal obstruction, unspecified as to partial versus complete obstruction: Secondary | ICD-10-CM | POA: Diagnosis not present

## 2020-11-07 DIAGNOSIS — A419 Sepsis, unspecified organism: Secondary | ICD-10-CM | POA: Diagnosis not present

## 2020-11-07 DIAGNOSIS — J90 Pleural effusion, not elsewhere classified: Secondary | ICD-10-CM | POA: Diagnosis not present

## 2020-11-07 DIAGNOSIS — R6521 Severe sepsis with septic shock: Secondary | ICD-10-CM | POA: Diagnosis not present

## 2020-11-07 DIAGNOSIS — R1084 Generalized abdominal pain: Secondary | ICD-10-CM | POA: Diagnosis not present

## 2020-11-08 DIAGNOSIS — K56699 Other intestinal obstruction unspecified as to partial versus complete obstruction: Secondary | ICD-10-CM | POA: Diagnosis not present

## 2020-11-08 DIAGNOSIS — J90 Pleural effusion, not elsewhere classified: Secondary | ICD-10-CM | POA: Diagnosis not present

## 2020-11-08 DIAGNOSIS — G8918 Other acute postprocedural pain: Secondary | ICD-10-CM | POA: Diagnosis not present

## 2020-11-08 DIAGNOSIS — K5720 Diverticulitis of large intestine with perforation and abscess without bleeding: Secondary | ICD-10-CM | POA: Diagnosis not present

## 2020-11-08 DIAGNOSIS — I959 Hypotension, unspecified: Secondary | ICD-10-CM | POA: Diagnosis not present

## 2020-11-08 DIAGNOSIS — Z8679 Personal history of other diseases of the circulatory system: Secondary | ICD-10-CM | POA: Diagnosis not present

## 2020-11-08 DIAGNOSIS — A419 Sepsis, unspecified organism: Secondary | ICD-10-CM | POA: Diagnosis not present

## 2020-11-08 DIAGNOSIS — K56609 Unspecified intestinal obstruction, unspecified as to partial versus complete obstruction: Secondary | ICD-10-CM | POA: Diagnosis not present

## 2020-11-08 DIAGNOSIS — J9601 Acute respiratory failure with hypoxia: Secondary | ICD-10-CM | POA: Diagnosis not present

## 2020-11-08 DIAGNOSIS — R1084 Generalized abdominal pain: Secondary | ICD-10-CM | POA: Diagnosis not present

## 2020-11-08 DIAGNOSIS — J9811 Atelectasis: Secondary | ICD-10-CM | POA: Diagnosis not present

## 2020-11-08 DIAGNOSIS — K55011 Focal (segmental) acute (reversible) ischemia of small intestine: Secondary | ICD-10-CM | POA: Diagnosis not present

## 2020-11-08 DIAGNOSIS — D135 Benign neoplasm of extrahepatic bile ducts: Secondary | ICD-10-CM | POA: Diagnosis not present

## 2020-11-08 DIAGNOSIS — R6521 Severe sepsis with septic shock: Secondary | ICD-10-CM | POA: Diagnosis not present

## 2020-11-09 DIAGNOSIS — J90 Pleural effusion, not elsewhere classified: Secondary | ICD-10-CM | POA: Diagnosis not present

## 2020-11-09 DIAGNOSIS — A419 Sepsis, unspecified organism: Secondary | ICD-10-CM | POA: Diagnosis not present

## 2020-11-09 DIAGNOSIS — R6521 Severe sepsis with septic shock: Secondary | ICD-10-CM | POA: Diagnosis not present

## 2020-11-09 DIAGNOSIS — J9811 Atelectasis: Secondary | ICD-10-CM | POA: Diagnosis not present

## 2020-11-09 DIAGNOSIS — J9601 Acute respiratory failure with hypoxia: Secondary | ICD-10-CM | POA: Diagnosis not present

## 2020-11-09 DIAGNOSIS — G8918 Other acute postprocedural pain: Secondary | ICD-10-CM | POA: Diagnosis not present

## 2020-11-09 DIAGNOSIS — I959 Hypotension, unspecified: Secondary | ICD-10-CM | POA: Diagnosis not present

## 2020-11-09 DIAGNOSIS — R1084 Generalized abdominal pain: Secondary | ICD-10-CM | POA: Diagnosis not present

## 2020-11-09 DIAGNOSIS — K56609 Unspecified intestinal obstruction, unspecified as to partial versus complete obstruction: Secondary | ICD-10-CM | POA: Diagnosis not present

## 2020-11-10 DIAGNOSIS — K559 Vascular disorder of intestine, unspecified: Secondary | ICD-10-CM | POA: Diagnosis not present

## 2020-11-10 DIAGNOSIS — R579 Shock, unspecified: Secondary | ICD-10-CM | POA: Diagnosis not present

## 2020-11-10 DIAGNOSIS — A419 Sepsis, unspecified organism: Secondary | ICD-10-CM | POA: Diagnosis not present

## 2020-11-10 DIAGNOSIS — G8918 Other acute postprocedural pain: Secondary | ICD-10-CM | POA: Diagnosis not present

## 2020-11-10 DIAGNOSIS — R1084 Generalized abdominal pain: Secondary | ICD-10-CM | POA: Diagnosis not present

## 2020-11-10 DIAGNOSIS — R1312 Dysphagia, oropharyngeal phase: Secondary | ICD-10-CM | POA: Diagnosis not present

## 2020-11-10 DIAGNOSIS — R6521 Severe sepsis with septic shock: Secondary | ICD-10-CM | POA: Diagnosis not present

## 2020-11-10 DIAGNOSIS — K56609 Unspecified intestinal obstruction, unspecified as to partial versus complete obstruction: Secondary | ICD-10-CM | POA: Diagnosis not present

## 2020-11-10 DIAGNOSIS — J9601 Acute respiratory failure with hypoxia: Secondary | ICD-10-CM | POA: Diagnosis not present

## 2020-11-11 DIAGNOSIS — Z452 Encounter for adjustment and management of vascular access device: Secondary | ICD-10-CM | POA: Diagnosis not present

## 2020-11-11 DIAGNOSIS — K56609 Unspecified intestinal obstruction, unspecified as to partial versus complete obstruction: Secondary | ICD-10-CM | POA: Diagnosis not present

## 2020-11-12 DIAGNOSIS — R1312 Dysphagia, oropharyngeal phase: Secondary | ICD-10-CM | POA: Diagnosis not present

## 2020-11-12 DIAGNOSIS — A419 Sepsis, unspecified organism: Secondary | ICD-10-CM | POA: Diagnosis not present

## 2020-11-12 DIAGNOSIS — R6521 Severe sepsis with septic shock: Secondary | ICD-10-CM | POA: Diagnosis not present

## 2020-11-12 DIAGNOSIS — K668 Other specified disorders of peritoneum: Secondary | ICD-10-CM | POA: Diagnosis not present

## 2020-11-12 DIAGNOSIS — K7689 Other specified diseases of liver: Secondary | ICD-10-CM | POA: Diagnosis not present

## 2020-11-13 DIAGNOSIS — R188 Other ascites: Secondary | ICD-10-CM | POA: Diagnosis not present

## 2020-11-14 DIAGNOSIS — K56609 Unspecified intestinal obstruction, unspecified as to partial versus complete obstruction: Secondary | ICD-10-CM | POA: Insufficient documentation

## 2020-11-16 DIAGNOSIS — K56609 Unspecified intestinal obstruction, unspecified as to partial versus complete obstruction: Secondary | ICD-10-CM | POA: Diagnosis not present

## 2020-11-16 DIAGNOSIS — J811 Chronic pulmonary edema: Secondary | ICD-10-CM | POA: Diagnosis not present

## 2020-11-16 DIAGNOSIS — M7989 Other specified soft tissue disorders: Secondary | ICD-10-CM | POA: Diagnosis not present

## 2020-11-16 DIAGNOSIS — J90 Pleural effusion, not elsewhere classified: Secondary | ICD-10-CM | POA: Diagnosis not present

## 2020-11-16 DIAGNOSIS — R1312 Dysphagia, oropharyngeal phase: Secondary | ICD-10-CM | POA: Diagnosis not present

## 2020-11-16 DIAGNOSIS — J9811 Atelectasis: Secondary | ICD-10-CM | POA: Diagnosis not present

## 2020-11-17 DIAGNOSIS — R5381 Other malaise: Secondary | ICD-10-CM | POA: Insufficient documentation

## 2020-11-17 DIAGNOSIS — K668 Other specified disorders of peritoneum: Secondary | ICD-10-CM | POA: Diagnosis not present

## 2020-11-17 DIAGNOSIS — R188 Other ascites: Secondary | ICD-10-CM | POA: Diagnosis not present

## 2020-11-18 DIAGNOSIS — R1084 Generalized abdominal pain: Secondary | ICD-10-CM | POA: Diagnosis not present

## 2020-11-18 DIAGNOSIS — K668 Other specified disorders of peritoneum: Secondary | ICD-10-CM | POA: Diagnosis not present

## 2020-11-23 DIAGNOSIS — R188 Other ascites: Secondary | ICD-10-CM | POA: Diagnosis not present

## 2020-11-24 DIAGNOSIS — N1330 Unspecified hydronephrosis: Secondary | ICD-10-CM | POA: Diagnosis not present

## 2020-11-24 DIAGNOSIS — K7689 Other specified diseases of liver: Secondary | ICD-10-CM | POA: Diagnosis not present

## 2020-11-29 DIAGNOSIS — N1330 Unspecified hydronephrosis: Secondary | ICD-10-CM | POA: Diagnosis not present

## 2020-11-29 DIAGNOSIS — K56609 Unspecified intestinal obstruction, unspecified as to partial versus complete obstruction: Secondary | ICD-10-CM | POA: Diagnosis not present

## 2020-11-29 DIAGNOSIS — K7689 Other specified diseases of liver: Secondary | ICD-10-CM | POA: Diagnosis not present

## 2020-12-01 DIAGNOSIS — T8143XA Infection following a procedure, organ and space surgical site, initial encounter: Secondary | ICD-10-CM | POA: Diagnosis not present

## 2020-12-01 DIAGNOSIS — T8132XA Disruption of internal operation (surgical) wound, not elsewhere classified, initial encounter: Secondary | ICD-10-CM | POA: Diagnosis not present

## 2020-12-01 DIAGNOSIS — E785 Hyperlipidemia, unspecified: Secondary | ICD-10-CM | POA: Diagnosis not present

## 2020-12-06 DIAGNOSIS — K651 Peritoneal abscess: Secondary | ICD-10-CM | POA: Diagnosis not present

## 2020-12-06 DIAGNOSIS — E559 Vitamin D deficiency, unspecified: Secondary | ICD-10-CM | POA: Diagnosis not present

## 2020-12-06 DIAGNOSIS — R531 Weakness: Secondary | ICD-10-CM | POA: Diagnosis not present

## 2020-12-06 DIAGNOSIS — R579 Shock, unspecified: Secondary | ICD-10-CM | POA: Diagnosis not present

## 2020-12-06 DIAGNOSIS — N3080 Other cystitis without hematuria: Secondary | ICD-10-CM | POA: Diagnosis not present

## 2020-12-06 DIAGNOSIS — D649 Anemia, unspecified: Secondary | ICD-10-CM | POA: Diagnosis not present

## 2020-12-06 DIAGNOSIS — K55059 Acute (reversible) ischemia of intestine, part and extent unspecified: Secondary | ICD-10-CM | POA: Diagnosis not present

## 2020-12-06 DIAGNOSIS — I9589 Other hypotension: Secondary | ICD-10-CM | POA: Diagnosis not present

## 2020-12-06 DIAGNOSIS — D696 Thrombocytopenia, unspecified: Secondary | ICD-10-CM | POA: Diagnosis not present

## 2020-12-06 DIAGNOSIS — E860 Dehydration: Secondary | ICD-10-CM | POA: Diagnosis not present

## 2020-12-06 DIAGNOSIS — S3710XD Unspecified injury of ureter, subsequent encounter: Secondary | ICD-10-CM | POA: Diagnosis not present

## 2020-12-06 DIAGNOSIS — R54 Age-related physical debility: Secondary | ICD-10-CM | POA: Diagnosis not present

## 2020-12-06 DIAGNOSIS — G4700 Insomnia, unspecified: Secondary | ICD-10-CM | POA: Diagnosis not present

## 2020-12-06 DIAGNOSIS — R197 Diarrhea, unspecified: Secondary | ICD-10-CM | POA: Diagnosis not present

## 2020-12-06 DIAGNOSIS — Z4801 Encounter for change or removal of surgical wound dressing: Secondary | ICD-10-CM | POA: Diagnosis not present

## 2020-12-06 DIAGNOSIS — N1330 Unspecified hydronephrosis: Secondary | ICD-10-CM | POA: Diagnosis not present

## 2020-12-06 DIAGNOSIS — E1129 Type 2 diabetes mellitus with other diabetic kidney complication: Secondary | ICD-10-CM | POA: Diagnosis not present

## 2020-12-06 DIAGNOSIS — Z7984 Long term (current) use of oral hypoglycemic drugs: Secondary | ICD-10-CM | POA: Diagnosis not present

## 2020-12-06 DIAGNOSIS — Z932 Ileostomy status: Secondary | ICD-10-CM | POA: Diagnosis not present

## 2020-12-06 DIAGNOSIS — I208 Other forms of angina pectoris: Secondary | ICD-10-CM | POA: Diagnosis not present

## 2020-12-06 DIAGNOSIS — E861 Hypovolemia: Secondary | ICD-10-CM | POA: Diagnosis not present

## 2020-12-06 DIAGNOSIS — R262 Difficulty in walking, not elsewhere classified: Secondary | ICD-10-CM | POA: Diagnosis not present

## 2020-12-06 DIAGNOSIS — K5900 Constipation, unspecified: Secondary | ICD-10-CM | POA: Diagnosis not present

## 2020-12-06 DIAGNOSIS — M1990 Unspecified osteoarthritis, unspecified site: Secondary | ICD-10-CM | POA: Diagnosis not present

## 2020-12-06 DIAGNOSIS — K559 Vascular disorder of intestine, unspecified: Secondary | ICD-10-CM | POA: Diagnosis not present

## 2020-12-06 DIAGNOSIS — E118 Type 2 diabetes mellitus with unspecified complications: Secondary | ICD-10-CM | POA: Diagnosis not present

## 2020-12-06 DIAGNOSIS — Z7401 Bed confinement status: Secondary | ICD-10-CM | POA: Diagnosis not present

## 2020-12-06 DIAGNOSIS — I10 Essential (primary) hypertension: Secondary | ICD-10-CM | POA: Diagnosis not present

## 2020-12-06 DIAGNOSIS — Z980 Intestinal bypass and anastomosis status: Secondary | ICD-10-CM | POA: Diagnosis not present

## 2020-12-06 DIAGNOSIS — Z298 Encounter for other specified prophylactic measures: Secondary | ICD-10-CM | POA: Diagnosis not present

## 2020-12-06 DIAGNOSIS — E119 Type 2 diabetes mellitus without complications: Secondary | ICD-10-CM | POA: Diagnosis not present

## 2020-12-06 DIAGNOSIS — K219 Gastro-esophageal reflux disease without esophagitis: Secondary | ICD-10-CM | POA: Diagnosis not present

## 2020-12-06 DIAGNOSIS — Z9049 Acquired absence of other specified parts of digestive tract: Secondary | ICD-10-CM | POA: Diagnosis not present

## 2020-12-06 DIAGNOSIS — R110 Nausea: Secondary | ICD-10-CM | POA: Diagnosis not present

## 2020-12-06 DIAGNOSIS — R7989 Other specified abnormal findings of blood chemistry: Secondary | ICD-10-CM | POA: Diagnosis not present

## 2020-12-06 DIAGNOSIS — A419 Sepsis, unspecified organism: Secondary | ICD-10-CM | POA: Diagnosis not present

## 2020-12-06 DIAGNOSIS — E785 Hyperlipidemia, unspecified: Secondary | ICD-10-CM | POA: Diagnosis not present

## 2020-12-06 DIAGNOSIS — Z20822 Contact with and (suspected) exposure to covid-19: Secondary | ICD-10-CM | POA: Diagnosis not present

## 2020-12-06 DIAGNOSIS — K56609 Unspecified intestinal obstruction, unspecified as to partial versus complete obstruction: Secondary | ICD-10-CM | POA: Diagnosis not present

## 2020-12-06 DIAGNOSIS — I959 Hypotension, unspecified: Secondary | ICD-10-CM | POA: Diagnosis not present

## 2020-12-06 DIAGNOSIS — R339 Retention of urine, unspecified: Secondary | ICD-10-CM | POA: Diagnosis not present

## 2020-12-06 DIAGNOSIS — K5730 Diverticulosis of large intestine without perforation or abscess without bleeding: Secondary | ICD-10-CM | POA: Diagnosis not present

## 2020-12-06 DIAGNOSIS — N390 Urinary tract infection, site not specified: Secondary | ICD-10-CM | POA: Diagnosis not present

## 2020-12-07 DIAGNOSIS — R339 Retention of urine, unspecified: Secondary | ICD-10-CM | POA: Diagnosis not present

## 2020-12-07 DIAGNOSIS — R54 Age-related physical debility: Secondary | ICD-10-CM | POA: Diagnosis not present

## 2020-12-07 DIAGNOSIS — N390 Urinary tract infection, site not specified: Secondary | ICD-10-CM | POA: Diagnosis not present

## 2020-12-07 DIAGNOSIS — K56609 Unspecified intestinal obstruction, unspecified as to partial versus complete obstruction: Secondary | ICD-10-CM | POA: Diagnosis not present

## 2020-12-10 DIAGNOSIS — K651 Peritoneal abscess: Secondary | ICD-10-CM | POA: Diagnosis not present

## 2020-12-10 DIAGNOSIS — R531 Weakness: Secondary | ICD-10-CM | POA: Diagnosis not present

## 2020-12-10 DIAGNOSIS — R197 Diarrhea, unspecified: Secondary | ICD-10-CM | POA: Diagnosis not present

## 2020-12-10 DIAGNOSIS — K56609 Unspecified intestinal obstruction, unspecified as to partial versus complete obstruction: Secondary | ICD-10-CM | POA: Diagnosis not present

## 2020-12-12 DIAGNOSIS — Z9049 Acquired absence of other specified parts of digestive tract: Secondary | ICD-10-CM | POA: Diagnosis not present

## 2020-12-12 DIAGNOSIS — K56609 Unspecified intestinal obstruction, unspecified as to partial versus complete obstruction: Secondary | ICD-10-CM | POA: Diagnosis not present

## 2020-12-12 DIAGNOSIS — K55059 Acute (reversible) ischemia of intestine, part and extent unspecified: Secondary | ICD-10-CM | POA: Diagnosis not present

## 2020-12-12 DIAGNOSIS — Z932 Ileostomy status: Secondary | ICD-10-CM | POA: Diagnosis not present

## 2020-12-12 DIAGNOSIS — R110 Nausea: Secondary | ICD-10-CM | POA: Diagnosis not present

## 2020-12-13 DIAGNOSIS — G4700 Insomnia, unspecified: Secondary | ICD-10-CM | POA: Diagnosis not present

## 2020-12-13 DIAGNOSIS — E119 Type 2 diabetes mellitus without complications: Secondary | ICD-10-CM | POA: Diagnosis not present

## 2020-12-13 DIAGNOSIS — I10 Essential (primary) hypertension: Secondary | ICD-10-CM | POA: Diagnosis not present

## 2020-12-13 DIAGNOSIS — R197 Diarrhea, unspecified: Secondary | ICD-10-CM | POA: Diagnosis not present

## 2020-12-15 ENCOUNTER — Ambulatory Visit: Payer: Medicare Other | Admitting: Podiatry

## 2020-12-18 DIAGNOSIS — K651 Peritoneal abscess: Secondary | ICD-10-CM | POA: Diagnosis not present

## 2020-12-18 DIAGNOSIS — K56609 Unspecified intestinal obstruction, unspecified as to partial versus complete obstruction: Secondary | ICD-10-CM | POA: Diagnosis not present

## 2020-12-18 DIAGNOSIS — R262 Difficulty in walking, not elsewhere classified: Secondary | ICD-10-CM | POA: Diagnosis not present

## 2020-12-18 DIAGNOSIS — Z9049 Acquired absence of other specified parts of digestive tract: Secondary | ICD-10-CM | POA: Diagnosis not present

## 2020-12-20 DIAGNOSIS — D696 Thrombocytopenia, unspecified: Secondary | ICD-10-CM | POA: Diagnosis not present

## 2020-12-20 DIAGNOSIS — E860 Dehydration: Secondary | ICD-10-CM | POA: Diagnosis not present

## 2020-12-20 DIAGNOSIS — K56609 Unspecified intestinal obstruction, unspecified as to partial versus complete obstruction: Secondary | ICD-10-CM | POA: Diagnosis not present

## 2020-12-20 DIAGNOSIS — R7989 Other specified abnormal findings of blood chemistry: Secondary | ICD-10-CM | POA: Diagnosis not present

## 2020-12-20 DIAGNOSIS — M1990 Unspecified osteoarthritis, unspecified site: Secondary | ICD-10-CM | POA: Diagnosis not present

## 2020-12-20 DIAGNOSIS — E861 Hypovolemia: Secondary | ICD-10-CM | POA: Diagnosis not present

## 2020-12-20 DIAGNOSIS — Z7401 Bed confinement status: Secondary | ICD-10-CM | POA: Diagnosis not present

## 2020-12-20 DIAGNOSIS — S3710XD Unspecified injury of ureter, subsequent encounter: Secondary | ICD-10-CM | POA: Diagnosis not present

## 2020-12-20 DIAGNOSIS — Z4801 Encounter for change or removal of surgical wound dressing: Secondary | ICD-10-CM | POA: Diagnosis not present

## 2020-12-20 DIAGNOSIS — I959 Hypotension, unspecified: Secondary | ICD-10-CM | POA: Diagnosis not present

## 2020-12-20 DIAGNOSIS — K551 Chronic vascular disorders of intestine: Secondary | ICD-10-CM | POA: Diagnosis not present

## 2020-12-20 DIAGNOSIS — E43 Unspecified severe protein-calorie malnutrition: Secondary | ICD-10-CM | POA: Diagnosis not present

## 2020-12-20 DIAGNOSIS — N3080 Other cystitis without hematuria: Secondary | ICD-10-CM | POA: Diagnosis not present

## 2020-12-20 DIAGNOSIS — N1330 Unspecified hydronephrosis: Secondary | ICD-10-CM | POA: Diagnosis not present

## 2020-12-20 DIAGNOSIS — K5900 Constipation, unspecified: Secondary | ICD-10-CM | POA: Diagnosis not present

## 2020-12-20 DIAGNOSIS — E119 Type 2 diabetes mellitus without complications: Secondary | ICD-10-CM | POA: Diagnosis not present

## 2020-12-20 DIAGNOSIS — K559 Vascular disorder of intestine, unspecified: Secondary | ICD-10-CM | POA: Diagnosis not present

## 2020-12-20 DIAGNOSIS — R579 Shock, unspecified: Secondary | ICD-10-CM | POA: Diagnosis not present

## 2020-12-20 DIAGNOSIS — Z980 Intestinal bypass and anastomosis status: Secondary | ICD-10-CM | POA: Diagnosis not present

## 2020-12-20 DIAGNOSIS — Z298 Encounter for other specified prophylactic measures: Secondary | ICD-10-CM | POA: Diagnosis not present

## 2020-12-20 DIAGNOSIS — K5730 Diverticulosis of large intestine without perforation or abscess without bleeding: Secondary | ICD-10-CM | POA: Diagnosis not present

## 2020-12-20 DIAGNOSIS — I700 Atherosclerosis of aorta: Secondary | ICD-10-CM | POA: Diagnosis not present

## 2020-12-20 DIAGNOSIS — E118 Type 2 diabetes mellitus with unspecified complications: Secondary | ICD-10-CM | POA: Diagnosis not present

## 2020-12-20 DIAGNOSIS — I208 Other forms of angina pectoris: Secondary | ICD-10-CM | POA: Diagnosis not present

## 2020-12-20 DIAGNOSIS — E785 Hyperlipidemia, unspecified: Secondary | ICD-10-CM | POA: Diagnosis not present

## 2020-12-20 DIAGNOSIS — E559 Vitamin D deficiency, unspecified: Secondary | ICD-10-CM | POA: Diagnosis not present

## 2020-12-20 DIAGNOSIS — Z7984 Long term (current) use of oral hypoglycemic drugs: Secondary | ICD-10-CM | POA: Diagnosis not present

## 2020-12-20 DIAGNOSIS — A419 Sepsis, unspecified organism: Secondary | ICD-10-CM | POA: Diagnosis not present

## 2020-12-20 DIAGNOSIS — E875 Hyperkalemia: Secondary | ICD-10-CM | POA: Diagnosis not present

## 2020-12-20 DIAGNOSIS — Z466 Encounter for fitting and adjustment of urinary device: Secondary | ICD-10-CM | POA: Diagnosis not present

## 2020-12-20 DIAGNOSIS — Z932 Ileostomy status: Secondary | ICD-10-CM | POA: Diagnosis not present

## 2020-12-20 DIAGNOSIS — I10 Essential (primary) hypertension: Secondary | ICD-10-CM | POA: Diagnosis not present

## 2020-12-20 DIAGNOSIS — I9589 Other hypotension: Secondary | ICD-10-CM | POA: Diagnosis not present

## 2020-12-20 DIAGNOSIS — R531 Weakness: Secondary | ICD-10-CM | POA: Diagnosis not present

## 2020-12-20 DIAGNOSIS — N390 Urinary tract infection, site not specified: Secondary | ICD-10-CM | POA: Diagnosis not present

## 2020-12-20 DIAGNOSIS — Z9049 Acquired absence of other specified parts of digestive tract: Secondary | ICD-10-CM | POA: Diagnosis not present

## 2020-12-20 DIAGNOSIS — Z20822 Contact with and (suspected) exposure to covid-19: Secondary | ICD-10-CM | POA: Diagnosis not present

## 2020-12-20 DIAGNOSIS — K219 Gastro-esophageal reflux disease without esophagitis: Secondary | ICD-10-CM | POA: Diagnosis not present

## 2020-12-20 DIAGNOSIS — E1129 Type 2 diabetes mellitus with other diabetic kidney complication: Secondary | ICD-10-CM | POA: Diagnosis not present

## 2020-12-21 DIAGNOSIS — Z932 Ileostomy status: Secondary | ICD-10-CM | POA: Insufficient documentation

## 2020-12-21 DIAGNOSIS — E860 Dehydration: Secondary | ICD-10-CM | POA: Insufficient documentation

## 2020-12-22 ENCOUNTER — Ambulatory Visit: Payer: Medicare Other | Admitting: Family Medicine

## 2020-12-23 ENCOUNTER — Other Ambulatory Visit: Payer: Self-pay | Admitting: Family Medicine

## 2020-12-23 DIAGNOSIS — I10 Essential (primary) hypertension: Secondary | ICD-10-CM

## 2020-12-27 DIAGNOSIS — Z932 Ileostomy status: Secondary | ICD-10-CM | POA: Diagnosis not present

## 2020-12-27 DIAGNOSIS — Z933 Colostomy status: Secondary | ICD-10-CM | POA: Diagnosis not present

## 2020-12-27 DIAGNOSIS — E43 Unspecified severe protein-calorie malnutrition: Secondary | ICD-10-CM | POA: Diagnosis not present

## 2020-12-27 DIAGNOSIS — E860 Dehydration: Secondary | ICD-10-CM | POA: Diagnosis not present

## 2020-12-27 DIAGNOSIS — M1990 Unspecified osteoarthritis, unspecified site: Secondary | ICD-10-CM | POA: Diagnosis not present

## 2020-12-27 DIAGNOSIS — E1129 Type 2 diabetes mellitus with other diabetic kidney complication: Secondary | ICD-10-CM | POA: Diagnosis not present

## 2020-12-27 DIAGNOSIS — E119 Type 2 diabetes mellitus without complications: Secondary | ICD-10-CM | POA: Diagnosis not present

## 2020-12-27 DIAGNOSIS — N390 Urinary tract infection, site not specified: Secondary | ICD-10-CM | POA: Diagnosis not present

## 2020-12-27 DIAGNOSIS — I208 Other forms of angina pectoris: Secondary | ICD-10-CM | POA: Diagnosis not present

## 2020-12-27 DIAGNOSIS — E559 Vitamin D deficiency, unspecified: Secondary | ICD-10-CM | POA: Diagnosis not present

## 2020-12-27 DIAGNOSIS — M6281 Muscle weakness (generalized): Secondary | ICD-10-CM | POA: Diagnosis not present

## 2020-12-27 DIAGNOSIS — R197 Diarrhea, unspecified: Secondary | ICD-10-CM | POA: Diagnosis not present

## 2020-12-27 DIAGNOSIS — E872 Acidosis: Secondary | ICD-10-CM | POA: Diagnosis not present

## 2020-12-27 DIAGNOSIS — Z7401 Bed confinement status: Secondary | ICD-10-CM | POA: Diagnosis not present

## 2020-12-27 DIAGNOSIS — Z7409 Other reduced mobility: Secondary | ICD-10-CM | POA: Diagnosis not present

## 2020-12-27 DIAGNOSIS — Z298 Encounter for other specified prophylactic measures: Secondary | ICD-10-CM | POA: Diagnosis not present

## 2020-12-27 DIAGNOSIS — K5900 Constipation, unspecified: Secondary | ICD-10-CM | POA: Diagnosis not present

## 2020-12-27 DIAGNOSIS — D649 Anemia, unspecified: Secondary | ICD-10-CM | POA: Diagnosis not present

## 2020-12-27 DIAGNOSIS — I959 Hypotension, unspecified: Secondary | ICD-10-CM | POA: Diagnosis not present

## 2020-12-27 DIAGNOSIS — K5730 Diverticulosis of large intestine without perforation or abscess without bleeding: Secondary | ICD-10-CM | POA: Diagnosis not present

## 2020-12-27 DIAGNOSIS — K651 Peritoneal abscess: Secondary | ICD-10-CM | POA: Diagnosis not present

## 2020-12-27 DIAGNOSIS — A419 Sepsis, unspecified organism: Secondary | ICD-10-CM | POA: Diagnosis not present

## 2020-12-27 DIAGNOSIS — E871 Hypo-osmolality and hyponatremia: Secondary | ICD-10-CM | POA: Diagnosis not present

## 2020-12-27 DIAGNOSIS — I10 Essential (primary) hypertension: Secondary | ICD-10-CM | POA: Diagnosis not present

## 2020-12-27 DIAGNOSIS — E875 Hyperkalemia: Secondary | ICD-10-CM | POA: Diagnosis not present

## 2020-12-27 DIAGNOSIS — K219 Gastro-esophageal reflux disease without esophagitis: Secondary | ICD-10-CM | POA: Diagnosis not present

## 2020-12-27 DIAGNOSIS — Z9049 Acquired absence of other specified parts of digestive tract: Secondary | ICD-10-CM | POA: Diagnosis not present

## 2020-12-27 DIAGNOSIS — N3080 Other cystitis without hematuria: Secondary | ICD-10-CM | POA: Diagnosis not present

## 2020-12-27 DIAGNOSIS — K56609 Unspecified intestinal obstruction, unspecified as to partial versus complete obstruction: Secondary | ICD-10-CM | POA: Diagnosis not present

## 2020-12-27 DIAGNOSIS — R579 Shock, unspecified: Secondary | ICD-10-CM | POA: Diagnosis not present

## 2020-12-27 DIAGNOSIS — S31109A Unspecified open wound of abdominal wall, unspecified quadrant without penetration into peritoneal cavity, initial encounter: Secondary | ICD-10-CM | POA: Diagnosis not present

## 2020-12-27 DIAGNOSIS — R531 Weakness: Secondary | ICD-10-CM | POA: Diagnosis not present

## 2020-12-27 DIAGNOSIS — K9419 Other complications of enterostomy: Secondary | ICD-10-CM | POA: Diagnosis not present

## 2020-12-27 DIAGNOSIS — K559 Vascular disorder of intestine, unspecified: Secondary | ICD-10-CM | POA: Diagnosis not present

## 2020-12-27 DIAGNOSIS — D696 Thrombocytopenia, unspecified: Secondary | ICD-10-CM | POA: Diagnosis not present

## 2020-12-27 DIAGNOSIS — E785 Hyperlipidemia, unspecified: Secondary | ICD-10-CM | POA: Diagnosis not present

## 2020-12-28 DIAGNOSIS — Z7409 Other reduced mobility: Secondary | ICD-10-CM | POA: Diagnosis not present

## 2020-12-28 DIAGNOSIS — K56609 Unspecified intestinal obstruction, unspecified as to partial versus complete obstruction: Secondary | ICD-10-CM | POA: Diagnosis not present

## 2020-12-28 DIAGNOSIS — I959 Hypotension, unspecified: Secondary | ICD-10-CM | POA: Diagnosis not present

## 2020-12-28 DIAGNOSIS — K9419 Other complications of enterostomy: Secondary | ICD-10-CM | POA: Diagnosis not present

## 2020-12-31 DIAGNOSIS — K9419 Other complications of enterostomy: Secondary | ICD-10-CM | POA: Diagnosis not present

## 2020-12-31 DIAGNOSIS — R197 Diarrhea, unspecified: Secondary | ICD-10-CM | POA: Diagnosis not present

## 2020-12-31 DIAGNOSIS — Z9049 Acquired absence of other specified parts of digestive tract: Secondary | ICD-10-CM | POA: Diagnosis not present

## 2020-12-31 DIAGNOSIS — E871 Hypo-osmolality and hyponatremia: Secondary | ICD-10-CM | POA: Diagnosis not present

## 2021-01-02 DIAGNOSIS — K9419 Other complications of enterostomy: Secondary | ICD-10-CM | POA: Diagnosis not present

## 2021-01-02 DIAGNOSIS — R531 Weakness: Secondary | ICD-10-CM | POA: Diagnosis not present

## 2021-01-02 DIAGNOSIS — E871 Hypo-osmolality and hyponatremia: Secondary | ICD-10-CM | POA: Diagnosis not present

## 2021-01-02 DIAGNOSIS — I959 Hypotension, unspecified: Secondary | ICD-10-CM | POA: Diagnosis not present

## 2021-01-02 DIAGNOSIS — E860 Dehydration: Secondary | ICD-10-CM | POA: Diagnosis not present

## 2021-01-04 DIAGNOSIS — E872 Acidosis: Secondary | ICD-10-CM | POA: Diagnosis not present

## 2021-01-04 DIAGNOSIS — E871 Hypo-osmolality and hyponatremia: Secondary | ICD-10-CM | POA: Diagnosis not present

## 2021-01-04 DIAGNOSIS — D696 Thrombocytopenia, unspecified: Secondary | ICD-10-CM | POA: Diagnosis not present

## 2021-01-04 DIAGNOSIS — E860 Dehydration: Secondary | ICD-10-CM | POA: Diagnosis not present

## 2021-01-05 DIAGNOSIS — E871 Hypo-osmolality and hyponatremia: Secondary | ICD-10-CM | POA: Diagnosis not present

## 2021-01-07 DIAGNOSIS — E860 Dehydration: Secondary | ICD-10-CM | POA: Diagnosis not present

## 2021-01-07 DIAGNOSIS — K56609 Unspecified intestinal obstruction, unspecified as to partial versus complete obstruction: Secondary | ICD-10-CM | POA: Diagnosis not present

## 2021-01-07 DIAGNOSIS — E871 Hypo-osmolality and hyponatremia: Secondary | ICD-10-CM | POA: Diagnosis not present

## 2021-01-11 DIAGNOSIS — M6281 Muscle weakness (generalized): Secondary | ICD-10-CM | POA: Diagnosis not present

## 2021-01-11 DIAGNOSIS — K56609 Unspecified intestinal obstruction, unspecified as to partial versus complete obstruction: Secondary | ICD-10-CM | POA: Diagnosis not present

## 2021-01-11 DIAGNOSIS — E872 Acidosis: Secondary | ICD-10-CM | POA: Diagnosis not present

## 2021-01-11 DIAGNOSIS — S31109A Unspecified open wound of abdominal wall, unspecified quadrant without penetration into peritoneal cavity, initial encounter: Secondary | ICD-10-CM | POA: Diagnosis not present

## 2021-01-11 DIAGNOSIS — E860 Dehydration: Secondary | ICD-10-CM | POA: Diagnosis not present

## 2021-01-11 DIAGNOSIS — K651 Peritoneal abscess: Secondary | ICD-10-CM | POA: Diagnosis not present

## 2021-01-11 DIAGNOSIS — K5730 Diverticulosis of large intestine without perforation or abscess without bleeding: Secondary | ICD-10-CM | POA: Diagnosis not present

## 2021-01-11 DIAGNOSIS — E875 Hyperkalemia: Secondary | ICD-10-CM | POA: Diagnosis not present

## 2021-01-11 DIAGNOSIS — Z933 Colostomy status: Secondary | ICD-10-CM | POA: Diagnosis not present

## 2021-01-19 DIAGNOSIS — R531 Weakness: Secondary | ICD-10-CM | POA: Diagnosis not present

## 2021-01-19 DIAGNOSIS — E871 Hypo-osmolality and hyponatremia: Secondary | ICD-10-CM | POA: Diagnosis not present

## 2021-01-19 DIAGNOSIS — K56609 Unspecified intestinal obstruction, unspecified as to partial versus complete obstruction: Secondary | ICD-10-CM | POA: Diagnosis not present

## 2021-01-19 DIAGNOSIS — K9419 Other complications of enterostomy: Secondary | ICD-10-CM | POA: Diagnosis not present

## 2021-01-19 DIAGNOSIS — Z932 Ileostomy status: Secondary | ICD-10-CM | POA: Diagnosis not present

## 2021-01-23 DIAGNOSIS — K559 Vascular disorder of intestine, unspecified: Secondary | ICD-10-CM | POA: Diagnosis not present

## 2021-01-23 DIAGNOSIS — M1990 Unspecified osteoarthritis, unspecified site: Secondary | ICD-10-CM | POA: Diagnosis not present

## 2021-01-23 DIAGNOSIS — N3080 Other cystitis without hematuria: Secondary | ICD-10-CM | POA: Diagnosis not present

## 2021-01-23 DIAGNOSIS — D696 Thrombocytopenia, unspecified: Secondary | ICD-10-CM | POA: Diagnosis not present

## 2021-01-23 DIAGNOSIS — Z932 Ileostomy status: Secondary | ICD-10-CM | POA: Diagnosis not present

## 2021-01-23 DIAGNOSIS — R579 Shock, unspecified: Secondary | ICD-10-CM | POA: Diagnosis not present

## 2021-01-23 DIAGNOSIS — E860 Dehydration: Secondary | ICD-10-CM | POA: Diagnosis not present

## 2021-01-23 DIAGNOSIS — K219 Gastro-esophageal reflux disease without esophagitis: Secondary | ICD-10-CM | POA: Diagnosis not present

## 2021-01-23 DIAGNOSIS — I208 Other forms of angina pectoris: Secondary | ICD-10-CM | POA: Diagnosis not present

## 2021-01-23 DIAGNOSIS — I959 Hypotension, unspecified: Secondary | ICD-10-CM | POA: Diagnosis not present

## 2021-01-23 DIAGNOSIS — M6281 Muscle weakness (generalized): Secondary | ICD-10-CM | POA: Diagnosis not present

## 2021-01-23 DIAGNOSIS — I10 Essential (primary) hypertension: Secondary | ICD-10-CM | POA: Diagnosis not present

## 2021-01-23 DIAGNOSIS — E1129 Type 2 diabetes mellitus with other diabetic kidney complication: Secondary | ICD-10-CM | POA: Diagnosis not present

## 2021-01-24 DIAGNOSIS — K559 Vascular disorder of intestine, unspecified: Secondary | ICD-10-CM | POA: Diagnosis not present

## 2021-01-24 DIAGNOSIS — M1990 Unspecified osteoarthritis, unspecified site: Secondary | ICD-10-CM | POA: Diagnosis not present

## 2021-01-24 DIAGNOSIS — I959 Hypotension, unspecified: Secondary | ICD-10-CM | POA: Diagnosis not present

## 2021-01-24 DIAGNOSIS — R579 Shock, unspecified: Secondary | ICD-10-CM | POA: Diagnosis not present

## 2021-01-24 DIAGNOSIS — N3080 Other cystitis without hematuria: Secondary | ICD-10-CM | POA: Diagnosis not present

## 2021-01-24 DIAGNOSIS — Z932 Ileostomy status: Secondary | ICD-10-CM | POA: Diagnosis not present

## 2021-01-24 DIAGNOSIS — M6281 Muscle weakness (generalized): Secondary | ICD-10-CM | POA: Diagnosis not present

## 2021-01-24 DIAGNOSIS — I208 Other forms of angina pectoris: Secondary | ICD-10-CM | POA: Diagnosis not present

## 2021-01-24 DIAGNOSIS — E860 Dehydration: Secondary | ICD-10-CM | POA: Diagnosis not present

## 2021-01-24 DIAGNOSIS — E1129 Type 2 diabetes mellitus with other diabetic kidney complication: Secondary | ICD-10-CM | POA: Diagnosis not present

## 2021-01-24 DIAGNOSIS — D696 Thrombocytopenia, unspecified: Secondary | ICD-10-CM | POA: Diagnosis not present

## 2021-01-24 DIAGNOSIS — K219 Gastro-esophageal reflux disease without esophagitis: Secondary | ICD-10-CM | POA: Diagnosis not present

## 2021-01-24 DIAGNOSIS — I10 Essential (primary) hypertension: Secondary | ICD-10-CM | POA: Diagnosis not present

## 2021-01-25 DIAGNOSIS — N3080 Other cystitis without hematuria: Secondary | ICD-10-CM | POA: Diagnosis not present

## 2021-01-25 DIAGNOSIS — I10 Essential (primary) hypertension: Secondary | ICD-10-CM | POA: Diagnosis not present

## 2021-01-25 DIAGNOSIS — R579 Shock, unspecified: Secondary | ICD-10-CM | POA: Diagnosis not present

## 2021-01-25 DIAGNOSIS — K219 Gastro-esophageal reflux disease without esophagitis: Secondary | ICD-10-CM | POA: Diagnosis not present

## 2021-01-25 DIAGNOSIS — M6281 Muscle weakness (generalized): Secondary | ICD-10-CM | POA: Diagnosis not present

## 2021-01-25 DIAGNOSIS — E1129 Type 2 diabetes mellitus with other diabetic kidney complication: Secondary | ICD-10-CM | POA: Diagnosis not present

## 2021-01-25 DIAGNOSIS — D696 Thrombocytopenia, unspecified: Secondary | ICD-10-CM | POA: Diagnosis not present

## 2021-01-25 DIAGNOSIS — Z932 Ileostomy status: Secondary | ICD-10-CM | POA: Diagnosis not present

## 2021-01-25 DIAGNOSIS — K651 Peritoneal abscess: Secondary | ICD-10-CM | POA: Diagnosis not present

## 2021-01-25 DIAGNOSIS — I959 Hypotension, unspecified: Secondary | ICD-10-CM | POA: Diagnosis not present

## 2021-01-25 DIAGNOSIS — K559 Vascular disorder of intestine, unspecified: Secondary | ICD-10-CM | POA: Diagnosis not present

## 2021-01-25 DIAGNOSIS — M1990 Unspecified osteoarthritis, unspecified site: Secondary | ICD-10-CM | POA: Diagnosis not present

## 2021-01-25 DIAGNOSIS — E860 Dehydration: Secondary | ICD-10-CM | POA: Diagnosis not present

## 2021-01-25 DIAGNOSIS — K56609 Unspecified intestinal obstruction, unspecified as to partial versus complete obstruction: Secondary | ICD-10-CM | POA: Diagnosis not present

## 2021-01-25 DIAGNOSIS — I208 Other forms of angina pectoris: Secondary | ICD-10-CM | POA: Diagnosis not present

## 2021-01-26 DIAGNOSIS — M6281 Muscle weakness (generalized): Secondary | ICD-10-CM | POA: Diagnosis not present

## 2021-01-26 DIAGNOSIS — E1129 Type 2 diabetes mellitus with other diabetic kidney complication: Secondary | ICD-10-CM | POA: Diagnosis not present

## 2021-01-26 DIAGNOSIS — K219 Gastro-esophageal reflux disease without esophagitis: Secondary | ICD-10-CM | POA: Diagnosis not present

## 2021-01-26 DIAGNOSIS — K559 Vascular disorder of intestine, unspecified: Secondary | ICD-10-CM | POA: Diagnosis not present

## 2021-01-26 DIAGNOSIS — T8189XA Other complications of procedures, not elsewhere classified, initial encounter: Secondary | ICD-10-CM | POA: Diagnosis not present

## 2021-01-26 DIAGNOSIS — I208 Other forms of angina pectoris: Secondary | ICD-10-CM | POA: Diagnosis not present

## 2021-01-26 DIAGNOSIS — R579 Shock, unspecified: Secondary | ICD-10-CM | POA: Diagnosis not present

## 2021-01-26 DIAGNOSIS — I10 Essential (primary) hypertension: Secondary | ICD-10-CM | POA: Diagnosis not present

## 2021-01-26 DIAGNOSIS — D696 Thrombocytopenia, unspecified: Secondary | ICD-10-CM | POA: Diagnosis not present

## 2021-01-26 DIAGNOSIS — N3080 Other cystitis without hematuria: Secondary | ICD-10-CM | POA: Diagnosis not present

## 2021-01-26 DIAGNOSIS — I959 Hypotension, unspecified: Secondary | ICD-10-CM | POA: Diagnosis not present

## 2021-01-26 DIAGNOSIS — Z932 Ileostomy status: Secondary | ICD-10-CM | POA: Diagnosis not present

## 2021-01-26 DIAGNOSIS — M1990 Unspecified osteoarthritis, unspecified site: Secondary | ICD-10-CM | POA: Diagnosis not present

## 2021-01-26 DIAGNOSIS — E860 Dehydration: Secondary | ICD-10-CM | POA: Diagnosis not present

## 2021-01-27 DIAGNOSIS — Z432 Encounter for attention to ileostomy: Secondary | ICD-10-CM | POA: Diagnosis not present

## 2021-01-27 DIAGNOSIS — D649 Anemia, unspecified: Secondary | ICD-10-CM | POA: Diagnosis not present

## 2021-01-27 DIAGNOSIS — I10 Essential (primary) hypertension: Secondary | ICD-10-CM | POA: Diagnosis not present

## 2021-01-27 DIAGNOSIS — Z9049 Acquired absence of other specified parts of digestive tract: Secondary | ICD-10-CM | POA: Diagnosis not present

## 2021-01-27 DIAGNOSIS — K219 Gastro-esophageal reflux disease without esophagitis: Secondary | ICD-10-CM | POA: Diagnosis not present

## 2021-01-27 DIAGNOSIS — Z48815 Encounter for surgical aftercare following surgery on the digestive system: Secondary | ICD-10-CM | POA: Diagnosis not present

## 2021-01-27 DIAGNOSIS — M1990 Unspecified osteoarthritis, unspecified site: Secondary | ICD-10-CM | POA: Diagnosis not present

## 2021-01-27 DIAGNOSIS — E119 Type 2 diabetes mellitus without complications: Secondary | ICD-10-CM | POA: Diagnosis not present

## 2021-01-27 DIAGNOSIS — D696 Thrombocytopenia, unspecified: Secondary | ICD-10-CM | POA: Diagnosis not present

## 2021-01-27 DIAGNOSIS — E785 Hyperlipidemia, unspecified: Secondary | ICD-10-CM | POA: Diagnosis not present

## 2021-01-27 DIAGNOSIS — K5730 Diverticulosis of large intestine without perforation or abscess without bleeding: Secondary | ICD-10-CM | POA: Diagnosis not present

## 2021-01-30 ENCOUNTER — Ambulatory Visit: Payer: Medicare Other | Admitting: Family Medicine

## 2021-01-30 DIAGNOSIS — K5730 Diverticulosis of large intestine without perforation or abscess without bleeding: Secondary | ICD-10-CM | POA: Diagnosis not present

## 2021-01-30 DIAGNOSIS — Z9049 Acquired absence of other specified parts of digestive tract: Secondary | ICD-10-CM | POA: Diagnosis not present

## 2021-01-30 DIAGNOSIS — D649 Anemia, unspecified: Secondary | ICD-10-CM | POA: Diagnosis not present

## 2021-01-30 DIAGNOSIS — E785 Hyperlipidemia, unspecified: Secondary | ICD-10-CM | POA: Diagnosis not present

## 2021-01-30 DIAGNOSIS — E119 Type 2 diabetes mellitus without complications: Secondary | ICD-10-CM | POA: Diagnosis not present

## 2021-01-30 DIAGNOSIS — M1990 Unspecified osteoarthritis, unspecified site: Secondary | ICD-10-CM | POA: Diagnosis not present

## 2021-01-30 DIAGNOSIS — Z48815 Encounter for surgical aftercare following surgery on the digestive system: Secondary | ICD-10-CM | POA: Diagnosis not present

## 2021-01-30 DIAGNOSIS — Z432 Encounter for attention to ileostomy: Secondary | ICD-10-CM | POA: Diagnosis not present

## 2021-01-30 DIAGNOSIS — K219 Gastro-esophageal reflux disease without esophagitis: Secondary | ICD-10-CM | POA: Diagnosis not present

## 2021-01-30 DIAGNOSIS — I10 Essential (primary) hypertension: Secondary | ICD-10-CM | POA: Diagnosis not present

## 2021-01-30 DIAGNOSIS — D696 Thrombocytopenia, unspecified: Secondary | ICD-10-CM | POA: Diagnosis not present

## 2021-01-31 DIAGNOSIS — M1990 Unspecified osteoarthritis, unspecified site: Secondary | ICD-10-CM | POA: Diagnosis not present

## 2021-01-31 DIAGNOSIS — E785 Hyperlipidemia, unspecified: Secondary | ICD-10-CM | POA: Diagnosis not present

## 2021-01-31 DIAGNOSIS — Z48815 Encounter for surgical aftercare following surgery on the digestive system: Secondary | ICD-10-CM | POA: Diagnosis not present

## 2021-01-31 DIAGNOSIS — I10 Essential (primary) hypertension: Secondary | ICD-10-CM | POA: Diagnosis not present

## 2021-01-31 DIAGNOSIS — Z432 Encounter for attention to ileostomy: Secondary | ICD-10-CM | POA: Diagnosis not present

## 2021-01-31 DIAGNOSIS — K219 Gastro-esophageal reflux disease without esophagitis: Secondary | ICD-10-CM | POA: Diagnosis not present

## 2021-01-31 DIAGNOSIS — Z9049 Acquired absence of other specified parts of digestive tract: Secondary | ICD-10-CM | POA: Diagnosis not present

## 2021-01-31 DIAGNOSIS — D649 Anemia, unspecified: Secondary | ICD-10-CM | POA: Diagnosis not present

## 2021-01-31 DIAGNOSIS — E119 Type 2 diabetes mellitus without complications: Secondary | ICD-10-CM | POA: Diagnosis not present

## 2021-01-31 DIAGNOSIS — K5730 Diverticulosis of large intestine without perforation or abscess without bleeding: Secondary | ICD-10-CM | POA: Diagnosis not present

## 2021-01-31 DIAGNOSIS — D696 Thrombocytopenia, unspecified: Secondary | ICD-10-CM | POA: Diagnosis not present

## 2021-02-01 DIAGNOSIS — M1990 Unspecified osteoarthritis, unspecified site: Secondary | ICD-10-CM | POA: Diagnosis not present

## 2021-02-01 DIAGNOSIS — Z432 Encounter for attention to ileostomy: Secondary | ICD-10-CM | POA: Diagnosis not present

## 2021-02-01 DIAGNOSIS — K5730 Diverticulosis of large intestine without perforation or abscess without bleeding: Secondary | ICD-10-CM | POA: Diagnosis not present

## 2021-02-01 DIAGNOSIS — Z48815 Encounter for surgical aftercare following surgery on the digestive system: Secondary | ICD-10-CM | POA: Diagnosis not present

## 2021-02-01 DIAGNOSIS — E785 Hyperlipidemia, unspecified: Secondary | ICD-10-CM | POA: Diagnosis not present

## 2021-02-01 DIAGNOSIS — D649 Anemia, unspecified: Secondary | ICD-10-CM | POA: Diagnosis not present

## 2021-02-01 DIAGNOSIS — K219 Gastro-esophageal reflux disease without esophagitis: Secondary | ICD-10-CM | POA: Diagnosis not present

## 2021-02-01 DIAGNOSIS — D696 Thrombocytopenia, unspecified: Secondary | ICD-10-CM | POA: Diagnosis not present

## 2021-02-01 DIAGNOSIS — E119 Type 2 diabetes mellitus without complications: Secondary | ICD-10-CM | POA: Diagnosis not present

## 2021-02-01 DIAGNOSIS — Z9049 Acquired absence of other specified parts of digestive tract: Secondary | ICD-10-CM | POA: Diagnosis not present

## 2021-02-01 DIAGNOSIS — I10 Essential (primary) hypertension: Secondary | ICD-10-CM | POA: Diagnosis not present

## 2021-02-03 DIAGNOSIS — K5730 Diverticulosis of large intestine without perforation or abscess without bleeding: Secondary | ICD-10-CM | POA: Diagnosis not present

## 2021-02-03 DIAGNOSIS — Z9049 Acquired absence of other specified parts of digestive tract: Secondary | ICD-10-CM | POA: Diagnosis not present

## 2021-02-03 DIAGNOSIS — D649 Anemia, unspecified: Secondary | ICD-10-CM | POA: Diagnosis not present

## 2021-02-03 DIAGNOSIS — I10 Essential (primary) hypertension: Secondary | ICD-10-CM | POA: Diagnosis not present

## 2021-02-03 DIAGNOSIS — D696 Thrombocytopenia, unspecified: Secondary | ICD-10-CM | POA: Diagnosis not present

## 2021-02-03 DIAGNOSIS — E119 Type 2 diabetes mellitus without complications: Secondary | ICD-10-CM | POA: Diagnosis not present

## 2021-02-03 DIAGNOSIS — E785 Hyperlipidemia, unspecified: Secondary | ICD-10-CM | POA: Diagnosis not present

## 2021-02-03 DIAGNOSIS — K219 Gastro-esophageal reflux disease without esophagitis: Secondary | ICD-10-CM | POA: Diagnosis not present

## 2021-02-03 DIAGNOSIS — Z48815 Encounter for surgical aftercare following surgery on the digestive system: Secondary | ICD-10-CM | POA: Diagnosis not present

## 2021-02-03 DIAGNOSIS — Z432 Encounter for attention to ileostomy: Secondary | ICD-10-CM | POA: Diagnosis not present

## 2021-02-03 DIAGNOSIS — M1990 Unspecified osteoarthritis, unspecified site: Secondary | ICD-10-CM | POA: Diagnosis not present

## 2021-02-06 DIAGNOSIS — I10 Essential (primary) hypertension: Secondary | ICD-10-CM | POA: Diagnosis not present

## 2021-02-06 DIAGNOSIS — E119 Type 2 diabetes mellitus without complications: Secondary | ICD-10-CM | POA: Diagnosis not present

## 2021-02-06 DIAGNOSIS — K5730 Diverticulosis of large intestine without perforation or abscess without bleeding: Secondary | ICD-10-CM | POA: Diagnosis not present

## 2021-02-06 DIAGNOSIS — D696 Thrombocytopenia, unspecified: Secondary | ICD-10-CM | POA: Diagnosis not present

## 2021-02-06 DIAGNOSIS — E785 Hyperlipidemia, unspecified: Secondary | ICD-10-CM | POA: Diagnosis not present

## 2021-02-06 DIAGNOSIS — Z432 Encounter for attention to ileostomy: Secondary | ICD-10-CM | POA: Diagnosis not present

## 2021-02-06 DIAGNOSIS — Z48815 Encounter for surgical aftercare following surgery on the digestive system: Secondary | ICD-10-CM | POA: Diagnosis not present

## 2021-02-06 DIAGNOSIS — D649 Anemia, unspecified: Secondary | ICD-10-CM | POA: Diagnosis not present

## 2021-02-06 DIAGNOSIS — K219 Gastro-esophageal reflux disease without esophagitis: Secondary | ICD-10-CM | POA: Diagnosis not present

## 2021-02-06 DIAGNOSIS — Z9049 Acquired absence of other specified parts of digestive tract: Secondary | ICD-10-CM | POA: Diagnosis not present

## 2021-02-06 DIAGNOSIS — M1990 Unspecified osteoarthritis, unspecified site: Secondary | ICD-10-CM | POA: Diagnosis not present

## 2021-02-08 DIAGNOSIS — E785 Hyperlipidemia, unspecified: Secondary | ICD-10-CM | POA: Diagnosis not present

## 2021-02-08 DIAGNOSIS — Z9049 Acquired absence of other specified parts of digestive tract: Secondary | ICD-10-CM | POA: Diagnosis not present

## 2021-02-08 DIAGNOSIS — I10 Essential (primary) hypertension: Secondary | ICD-10-CM | POA: Diagnosis not present

## 2021-02-08 DIAGNOSIS — K219 Gastro-esophageal reflux disease without esophagitis: Secondary | ICD-10-CM | POA: Diagnosis not present

## 2021-02-08 DIAGNOSIS — E119 Type 2 diabetes mellitus without complications: Secondary | ICD-10-CM | POA: Diagnosis not present

## 2021-02-08 DIAGNOSIS — M1990 Unspecified osteoarthritis, unspecified site: Secondary | ICD-10-CM | POA: Diagnosis not present

## 2021-02-08 DIAGNOSIS — D696 Thrombocytopenia, unspecified: Secondary | ICD-10-CM | POA: Diagnosis not present

## 2021-02-08 DIAGNOSIS — D649 Anemia, unspecified: Secondary | ICD-10-CM | POA: Diagnosis not present

## 2021-02-08 DIAGNOSIS — K631 Perforation of intestine (nontraumatic): Secondary | ICD-10-CM | POA: Diagnosis not present

## 2021-02-08 DIAGNOSIS — Z48815 Encounter for surgical aftercare following surgery on the digestive system: Secondary | ICD-10-CM | POA: Diagnosis not present

## 2021-02-08 DIAGNOSIS — K5730 Diverticulosis of large intestine without perforation or abscess without bleeding: Secondary | ICD-10-CM | POA: Diagnosis not present

## 2021-02-08 DIAGNOSIS — Z432 Encounter for attention to ileostomy: Secondary | ICD-10-CM | POA: Diagnosis not present

## 2021-02-09 DIAGNOSIS — Z432 Encounter for attention to ileostomy: Secondary | ICD-10-CM | POA: Diagnosis not present

## 2021-02-09 DIAGNOSIS — I10 Essential (primary) hypertension: Secondary | ICD-10-CM | POA: Diagnosis not present

## 2021-02-09 DIAGNOSIS — D696 Thrombocytopenia, unspecified: Secondary | ICD-10-CM | POA: Diagnosis not present

## 2021-02-09 DIAGNOSIS — K5730 Diverticulosis of large intestine without perforation or abscess without bleeding: Secondary | ICD-10-CM | POA: Diagnosis not present

## 2021-02-09 DIAGNOSIS — M1990 Unspecified osteoarthritis, unspecified site: Secondary | ICD-10-CM | POA: Diagnosis not present

## 2021-02-09 DIAGNOSIS — Z48815 Encounter for surgical aftercare following surgery on the digestive system: Secondary | ICD-10-CM | POA: Diagnosis not present

## 2021-02-09 DIAGNOSIS — Z9049 Acquired absence of other specified parts of digestive tract: Secondary | ICD-10-CM | POA: Diagnosis not present

## 2021-02-09 DIAGNOSIS — E785 Hyperlipidemia, unspecified: Secondary | ICD-10-CM | POA: Diagnosis not present

## 2021-02-09 DIAGNOSIS — E119 Type 2 diabetes mellitus without complications: Secondary | ICD-10-CM | POA: Diagnosis not present

## 2021-02-09 DIAGNOSIS — K219 Gastro-esophageal reflux disease without esophagitis: Secondary | ICD-10-CM | POA: Diagnosis not present

## 2021-02-09 DIAGNOSIS — T8189XA Other complications of procedures, not elsewhere classified, initial encounter: Secondary | ICD-10-CM | POA: Diagnosis not present

## 2021-02-09 DIAGNOSIS — D649 Anemia, unspecified: Secondary | ICD-10-CM | POA: Diagnosis not present

## 2021-02-10 DIAGNOSIS — K5730 Diverticulosis of large intestine without perforation or abscess without bleeding: Secondary | ICD-10-CM | POA: Diagnosis not present

## 2021-02-10 DIAGNOSIS — E785 Hyperlipidemia, unspecified: Secondary | ICD-10-CM | POA: Diagnosis not present

## 2021-02-10 DIAGNOSIS — M1990 Unspecified osteoarthritis, unspecified site: Secondary | ICD-10-CM | POA: Diagnosis not present

## 2021-02-10 DIAGNOSIS — K219 Gastro-esophageal reflux disease without esophagitis: Secondary | ICD-10-CM | POA: Diagnosis not present

## 2021-02-10 DIAGNOSIS — I10 Essential (primary) hypertension: Secondary | ICD-10-CM | POA: Diagnosis not present

## 2021-02-10 DIAGNOSIS — D649 Anemia, unspecified: Secondary | ICD-10-CM | POA: Diagnosis not present

## 2021-02-10 DIAGNOSIS — Z48815 Encounter for surgical aftercare following surgery on the digestive system: Secondary | ICD-10-CM | POA: Diagnosis not present

## 2021-02-10 DIAGNOSIS — E119 Type 2 diabetes mellitus without complications: Secondary | ICD-10-CM | POA: Diagnosis not present

## 2021-02-10 DIAGNOSIS — Z9049 Acquired absence of other specified parts of digestive tract: Secondary | ICD-10-CM | POA: Diagnosis not present

## 2021-02-10 DIAGNOSIS — Z432 Encounter for attention to ileostomy: Secondary | ICD-10-CM | POA: Diagnosis not present

## 2021-02-10 DIAGNOSIS — D696 Thrombocytopenia, unspecified: Secondary | ICD-10-CM | POA: Diagnosis not present

## 2021-02-13 DIAGNOSIS — Z9049 Acquired absence of other specified parts of digestive tract: Secondary | ICD-10-CM | POA: Diagnosis not present

## 2021-02-13 DIAGNOSIS — E785 Hyperlipidemia, unspecified: Secondary | ICD-10-CM | POA: Diagnosis not present

## 2021-02-13 DIAGNOSIS — M1990 Unspecified osteoarthritis, unspecified site: Secondary | ICD-10-CM | POA: Diagnosis not present

## 2021-02-13 DIAGNOSIS — E119 Type 2 diabetes mellitus without complications: Secondary | ICD-10-CM | POA: Diagnosis not present

## 2021-02-13 DIAGNOSIS — K5730 Diverticulosis of large intestine without perforation or abscess without bleeding: Secondary | ICD-10-CM | POA: Diagnosis not present

## 2021-02-13 DIAGNOSIS — D696 Thrombocytopenia, unspecified: Secondary | ICD-10-CM | POA: Diagnosis not present

## 2021-02-13 DIAGNOSIS — Z432 Encounter for attention to ileostomy: Secondary | ICD-10-CM | POA: Diagnosis not present

## 2021-02-13 DIAGNOSIS — D649 Anemia, unspecified: Secondary | ICD-10-CM | POA: Diagnosis not present

## 2021-02-13 DIAGNOSIS — I10 Essential (primary) hypertension: Secondary | ICD-10-CM | POA: Diagnosis not present

## 2021-02-13 DIAGNOSIS — K219 Gastro-esophageal reflux disease without esophagitis: Secondary | ICD-10-CM | POA: Diagnosis not present

## 2021-02-13 DIAGNOSIS — Z48815 Encounter for surgical aftercare following surgery on the digestive system: Secondary | ICD-10-CM | POA: Diagnosis not present

## 2021-02-14 ENCOUNTER — Other Ambulatory Visit: Payer: Self-pay | Admitting: Family Medicine

## 2021-02-14 DIAGNOSIS — K219 Gastro-esophageal reflux disease without esophagitis: Secondary | ICD-10-CM | POA: Diagnosis not present

## 2021-02-14 DIAGNOSIS — E119 Type 2 diabetes mellitus without complications: Secondary | ICD-10-CM | POA: Diagnosis not present

## 2021-02-14 DIAGNOSIS — D649 Anemia, unspecified: Secondary | ICD-10-CM | POA: Diagnosis not present

## 2021-02-14 DIAGNOSIS — Z9049 Acquired absence of other specified parts of digestive tract: Secondary | ICD-10-CM | POA: Diagnosis not present

## 2021-02-14 DIAGNOSIS — K5730 Diverticulosis of large intestine without perforation or abscess without bleeding: Secondary | ICD-10-CM | POA: Diagnosis not present

## 2021-02-14 DIAGNOSIS — Z432 Encounter for attention to ileostomy: Secondary | ICD-10-CM | POA: Diagnosis not present

## 2021-02-14 DIAGNOSIS — D696 Thrombocytopenia, unspecified: Secondary | ICD-10-CM | POA: Diagnosis not present

## 2021-02-14 DIAGNOSIS — I10 Essential (primary) hypertension: Secondary | ICD-10-CM | POA: Diagnosis not present

## 2021-02-14 DIAGNOSIS — M1990 Unspecified osteoarthritis, unspecified site: Secondary | ICD-10-CM | POA: Diagnosis not present

## 2021-02-14 DIAGNOSIS — Z48815 Encounter for surgical aftercare following surgery on the digestive system: Secondary | ICD-10-CM | POA: Diagnosis not present

## 2021-02-14 DIAGNOSIS — E785 Hyperlipidemia, unspecified: Secondary | ICD-10-CM | POA: Diagnosis not present

## 2021-02-15 DIAGNOSIS — E785 Hyperlipidemia, unspecified: Secondary | ICD-10-CM | POA: Diagnosis not present

## 2021-02-15 DIAGNOSIS — E119 Type 2 diabetes mellitus without complications: Secondary | ICD-10-CM | POA: Diagnosis not present

## 2021-02-15 DIAGNOSIS — K5730 Diverticulosis of large intestine without perforation or abscess without bleeding: Secondary | ICD-10-CM | POA: Diagnosis not present

## 2021-02-15 DIAGNOSIS — Z432 Encounter for attention to ileostomy: Secondary | ICD-10-CM | POA: Diagnosis not present

## 2021-02-15 DIAGNOSIS — M1990 Unspecified osteoarthritis, unspecified site: Secondary | ICD-10-CM | POA: Diagnosis not present

## 2021-02-15 DIAGNOSIS — I10 Essential (primary) hypertension: Secondary | ICD-10-CM | POA: Diagnosis not present

## 2021-02-15 DIAGNOSIS — D649 Anemia, unspecified: Secondary | ICD-10-CM | POA: Diagnosis not present

## 2021-02-15 DIAGNOSIS — Z9049 Acquired absence of other specified parts of digestive tract: Secondary | ICD-10-CM | POA: Diagnosis not present

## 2021-02-15 DIAGNOSIS — Z48815 Encounter for surgical aftercare following surgery on the digestive system: Secondary | ICD-10-CM | POA: Diagnosis not present

## 2021-02-15 DIAGNOSIS — D696 Thrombocytopenia, unspecified: Secondary | ICD-10-CM | POA: Diagnosis not present

## 2021-02-15 DIAGNOSIS — K219 Gastro-esophageal reflux disease without esophagitis: Secondary | ICD-10-CM | POA: Diagnosis not present

## 2021-02-17 DIAGNOSIS — Z432 Encounter for attention to ileostomy: Secondary | ICD-10-CM | POA: Diagnosis not present

## 2021-02-17 DIAGNOSIS — I10 Essential (primary) hypertension: Secondary | ICD-10-CM | POA: Diagnosis not present

## 2021-02-17 DIAGNOSIS — K5730 Diverticulosis of large intestine without perforation or abscess without bleeding: Secondary | ICD-10-CM | POA: Diagnosis not present

## 2021-02-17 DIAGNOSIS — M1990 Unspecified osteoarthritis, unspecified site: Secondary | ICD-10-CM | POA: Diagnosis not present

## 2021-02-17 DIAGNOSIS — D649 Anemia, unspecified: Secondary | ICD-10-CM | POA: Diagnosis not present

## 2021-02-17 DIAGNOSIS — K219 Gastro-esophageal reflux disease without esophagitis: Secondary | ICD-10-CM | POA: Diagnosis not present

## 2021-02-17 DIAGNOSIS — E119 Type 2 diabetes mellitus without complications: Secondary | ICD-10-CM | POA: Diagnosis not present

## 2021-02-17 DIAGNOSIS — E785 Hyperlipidemia, unspecified: Secondary | ICD-10-CM | POA: Diagnosis not present

## 2021-02-17 DIAGNOSIS — Z48815 Encounter for surgical aftercare following surgery on the digestive system: Secondary | ICD-10-CM | POA: Diagnosis not present

## 2021-02-17 DIAGNOSIS — Z9049 Acquired absence of other specified parts of digestive tract: Secondary | ICD-10-CM | POA: Diagnosis not present

## 2021-02-17 DIAGNOSIS — D696 Thrombocytopenia, unspecified: Secondary | ICD-10-CM | POA: Diagnosis not present

## 2021-02-20 DIAGNOSIS — Z432 Encounter for attention to ileostomy: Secondary | ICD-10-CM | POA: Diagnosis not present

## 2021-02-20 DIAGNOSIS — I10 Essential (primary) hypertension: Secondary | ICD-10-CM | POA: Diagnosis not present

## 2021-02-20 DIAGNOSIS — M1990 Unspecified osteoarthritis, unspecified site: Secondary | ICD-10-CM | POA: Diagnosis not present

## 2021-02-20 DIAGNOSIS — Z48815 Encounter for surgical aftercare following surgery on the digestive system: Secondary | ICD-10-CM | POA: Diagnosis not present

## 2021-02-20 DIAGNOSIS — D649 Anemia, unspecified: Secondary | ICD-10-CM | POA: Diagnosis not present

## 2021-02-20 DIAGNOSIS — K219 Gastro-esophageal reflux disease without esophagitis: Secondary | ICD-10-CM | POA: Diagnosis not present

## 2021-02-20 DIAGNOSIS — D696 Thrombocytopenia, unspecified: Secondary | ICD-10-CM | POA: Diagnosis not present

## 2021-02-20 DIAGNOSIS — E119 Type 2 diabetes mellitus without complications: Secondary | ICD-10-CM | POA: Diagnosis not present

## 2021-02-20 DIAGNOSIS — E785 Hyperlipidemia, unspecified: Secondary | ICD-10-CM | POA: Diagnosis not present

## 2021-02-20 DIAGNOSIS — Z9049 Acquired absence of other specified parts of digestive tract: Secondary | ICD-10-CM | POA: Diagnosis not present

## 2021-02-20 DIAGNOSIS — K5730 Diverticulosis of large intestine without perforation or abscess without bleeding: Secondary | ICD-10-CM | POA: Diagnosis not present

## 2021-02-22 DIAGNOSIS — M1990 Unspecified osteoarthritis, unspecified site: Secondary | ICD-10-CM | POA: Diagnosis not present

## 2021-02-22 DIAGNOSIS — E785 Hyperlipidemia, unspecified: Secondary | ICD-10-CM | POA: Diagnosis not present

## 2021-02-22 DIAGNOSIS — E119 Type 2 diabetes mellitus without complications: Secondary | ICD-10-CM | POA: Diagnosis not present

## 2021-02-22 DIAGNOSIS — I10 Essential (primary) hypertension: Secondary | ICD-10-CM | POA: Diagnosis not present

## 2021-02-22 DIAGNOSIS — K5730 Diverticulosis of large intestine without perforation or abscess without bleeding: Secondary | ICD-10-CM | POA: Diagnosis not present

## 2021-02-22 DIAGNOSIS — D649 Anemia, unspecified: Secondary | ICD-10-CM | POA: Diagnosis not present

## 2021-02-22 DIAGNOSIS — Z48815 Encounter for surgical aftercare following surgery on the digestive system: Secondary | ICD-10-CM | POA: Diagnosis not present

## 2021-02-22 DIAGNOSIS — D696 Thrombocytopenia, unspecified: Secondary | ICD-10-CM | POA: Diagnosis not present

## 2021-02-22 DIAGNOSIS — K219 Gastro-esophageal reflux disease without esophagitis: Secondary | ICD-10-CM | POA: Diagnosis not present

## 2021-02-22 DIAGNOSIS — Z9049 Acquired absence of other specified parts of digestive tract: Secondary | ICD-10-CM | POA: Diagnosis not present

## 2021-02-22 DIAGNOSIS — Z432 Encounter for attention to ileostomy: Secondary | ICD-10-CM | POA: Diagnosis not present

## 2021-02-23 ENCOUNTER — Telehealth: Payer: Self-pay | Admitting: Family Medicine

## 2021-02-23 DIAGNOSIS — D696 Thrombocytopenia, unspecified: Secondary | ICD-10-CM | POA: Diagnosis not present

## 2021-02-23 DIAGNOSIS — E785 Hyperlipidemia, unspecified: Secondary | ICD-10-CM | POA: Diagnosis not present

## 2021-02-23 DIAGNOSIS — Z9049 Acquired absence of other specified parts of digestive tract: Secondary | ICD-10-CM | POA: Diagnosis not present

## 2021-02-23 DIAGNOSIS — I10 Essential (primary) hypertension: Secondary | ICD-10-CM | POA: Diagnosis not present

## 2021-02-23 DIAGNOSIS — Z432 Encounter for attention to ileostomy: Secondary | ICD-10-CM | POA: Diagnosis not present

## 2021-02-23 DIAGNOSIS — Z48815 Encounter for surgical aftercare following surgery on the digestive system: Secondary | ICD-10-CM | POA: Diagnosis not present

## 2021-02-23 DIAGNOSIS — K219 Gastro-esophageal reflux disease without esophagitis: Secondary | ICD-10-CM | POA: Diagnosis not present

## 2021-02-23 DIAGNOSIS — K5730 Diverticulosis of large intestine without perforation or abscess without bleeding: Secondary | ICD-10-CM | POA: Diagnosis not present

## 2021-02-23 DIAGNOSIS — M1990 Unspecified osteoarthritis, unspecified site: Secondary | ICD-10-CM | POA: Diagnosis not present

## 2021-02-23 DIAGNOSIS — D649 Anemia, unspecified: Secondary | ICD-10-CM | POA: Diagnosis not present

## 2021-02-23 DIAGNOSIS — E119 Type 2 diabetes mellitus without complications: Secondary | ICD-10-CM | POA: Diagnosis not present

## 2021-02-23 NOTE — Telephone Encounter (Signed)
Error

## 2021-02-24 DIAGNOSIS — T8189XA Other complications of procedures, not elsewhere classified, initial encounter: Secondary | ICD-10-CM | POA: Diagnosis not present

## 2021-02-24 DIAGNOSIS — M1990 Unspecified osteoarthritis, unspecified site: Secondary | ICD-10-CM | POA: Diagnosis not present

## 2021-02-24 DIAGNOSIS — E785 Hyperlipidemia, unspecified: Secondary | ICD-10-CM | POA: Diagnosis not present

## 2021-02-24 DIAGNOSIS — I10 Essential (primary) hypertension: Secondary | ICD-10-CM | POA: Diagnosis not present

## 2021-02-24 DIAGNOSIS — K219 Gastro-esophageal reflux disease without esophagitis: Secondary | ICD-10-CM | POA: Diagnosis not present

## 2021-02-24 DIAGNOSIS — Z48815 Encounter for surgical aftercare following surgery on the digestive system: Secondary | ICD-10-CM | POA: Diagnosis not present

## 2021-02-24 DIAGNOSIS — D696 Thrombocytopenia, unspecified: Secondary | ICD-10-CM | POA: Diagnosis not present

## 2021-02-24 DIAGNOSIS — S3710XD Unspecified injury of ureter, subsequent encounter: Secondary | ICD-10-CM | POA: Diagnosis not present

## 2021-02-24 DIAGNOSIS — Z432 Encounter for attention to ileostomy: Secondary | ICD-10-CM | POA: Diagnosis not present

## 2021-02-24 DIAGNOSIS — E119 Type 2 diabetes mellitus without complications: Secondary | ICD-10-CM | POA: Diagnosis not present

## 2021-02-24 DIAGNOSIS — K5730 Diverticulosis of large intestine without perforation or abscess without bleeding: Secondary | ICD-10-CM | POA: Diagnosis not present

## 2021-02-24 DIAGNOSIS — D649 Anemia, unspecified: Secondary | ICD-10-CM | POA: Diagnosis not present

## 2021-02-24 DIAGNOSIS — Z9049 Acquired absence of other specified parts of digestive tract: Secondary | ICD-10-CM | POA: Diagnosis not present

## 2021-02-26 DIAGNOSIS — T8189XA Other complications of procedures, not elsewhere classified, initial encounter: Secondary | ICD-10-CM | POA: Diagnosis not present

## 2021-02-27 ENCOUNTER — Other Ambulatory Visit: Payer: Self-pay | Admitting: Family Medicine

## 2021-02-27 ENCOUNTER — Ambulatory Visit: Payer: Self-pay | Admitting: *Deleted

## 2021-02-27 DIAGNOSIS — I10 Essential (primary) hypertension: Secondary | ICD-10-CM

## 2021-02-27 DIAGNOSIS — Z432 Encounter for attention to ileostomy: Secondary | ICD-10-CM | POA: Diagnosis not present

## 2021-02-27 DIAGNOSIS — E785 Hyperlipidemia, unspecified: Secondary | ICD-10-CM | POA: Diagnosis not present

## 2021-02-27 DIAGNOSIS — D649 Anemia, unspecified: Secondary | ICD-10-CM | POA: Diagnosis not present

## 2021-02-27 DIAGNOSIS — Z9049 Acquired absence of other specified parts of digestive tract: Secondary | ICD-10-CM | POA: Diagnosis not present

## 2021-02-27 DIAGNOSIS — K219 Gastro-esophageal reflux disease without esophagitis: Secondary | ICD-10-CM | POA: Diagnosis not present

## 2021-02-27 DIAGNOSIS — M1990 Unspecified osteoarthritis, unspecified site: Secondary | ICD-10-CM | POA: Diagnosis not present

## 2021-02-27 DIAGNOSIS — D696 Thrombocytopenia, unspecified: Secondary | ICD-10-CM | POA: Diagnosis not present

## 2021-02-27 DIAGNOSIS — E119 Type 2 diabetes mellitus without complications: Secondary | ICD-10-CM | POA: Diagnosis not present

## 2021-02-27 DIAGNOSIS — K5730 Diverticulosis of large intestine without perforation or abscess without bleeding: Secondary | ICD-10-CM | POA: Diagnosis not present

## 2021-02-27 DIAGNOSIS — Z48815 Encounter for surgical aftercare following surgery on the digestive system: Secondary | ICD-10-CM | POA: Diagnosis not present

## 2021-02-27 NOTE — Telephone Encounter (Signed)
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 Now with a wound vac and ostomy.She has been home for 3 weeks. Mr.Can reporting she has very mild edema in both ankles over the past 4 days. Denies SOB/CP. She was discharged with same list of medications except Diovan-HCT160-25 mg. He will monitor for any increase in edema and call if needed before scheduled follow-up appointment on 03/07/21.

## 2021-02-27 NOTE — Telephone Encounter (Signed)
  Notes to clinic:  The original prescription was discontinued on 09/19/2020 by Berton Mount L for the following reason: Change in therapy. Renewing this prescription may not be appropriate   Requested Prescriptions  Pending Prescriptions Disp Refills   hydrochlorothiazide (HYDRODIURIL) 25 MG tablet [Pharmacy Med Name: HYDROCHLOROTHIAZIDE 25 MG TAB] 30 tablet 0    Sig: TAKE (1) TABLET BY MOUTH EVERY DAY      Cardiovascular: Diuretics - Thiazide Passed - 02/27/2021  9:58 AM      Passed - Ca in normal range and within 360 days    Calcium  Date Value Ref Range Status  07/27/2020 9.6 8.9 - 10.3 mg/dL Final          Passed - Cr in normal range and within 360 days    Creatinine  Date Value Ref Range Status  08/06/2013 0.87 0.60 - 1.30 mg/dL Final   Creatinine, Ser  Date Value Ref Range Status  07/27/2020 0.81 0.44 - 1.00 mg/dL Final          Passed - K in normal range and within 360 days    Potassium  Date Value Ref Range Status  07/27/2020 4.4 3.5 - 5.1 mmol/L Final          Passed - Na in normal range and within 360 days    Sodium  Date Value Ref Range Status  07/27/2020 142 135 - 145 mmol/L Final  10/22/2019 145 (H) 134 - 144 mmol/L Final          Passed - Last BP in normal range    BP Readings from Last 1 Encounters:  10/25/20 130/80          Passed - Valid encounter within last 6 months    Recent Outpatient Visits           4 months ago 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

## 2021-02-28 DIAGNOSIS — K5730 Diverticulosis of large intestine without perforation or abscess without bleeding: Secondary | ICD-10-CM | POA: Diagnosis not present

## 2021-02-28 DIAGNOSIS — K219 Gastro-esophageal reflux disease without esophagitis: Secondary | ICD-10-CM | POA: Diagnosis not present

## 2021-02-28 DIAGNOSIS — Z432 Encounter for attention to ileostomy: Secondary | ICD-10-CM | POA: Diagnosis not present

## 2021-02-28 DIAGNOSIS — E785 Hyperlipidemia, unspecified: Secondary | ICD-10-CM | POA: Diagnosis not present

## 2021-02-28 DIAGNOSIS — Z48815 Encounter for surgical aftercare following surgery on the digestive system: Secondary | ICD-10-CM | POA: Diagnosis not present

## 2021-02-28 DIAGNOSIS — Z9049 Acquired absence of other specified parts of digestive tract: Secondary | ICD-10-CM | POA: Diagnosis not present

## 2021-02-28 DIAGNOSIS — E119 Type 2 diabetes mellitus without complications: Secondary | ICD-10-CM | POA: Diagnosis not present

## 2021-02-28 DIAGNOSIS — M1990 Unspecified osteoarthritis, unspecified site: Secondary | ICD-10-CM | POA: Diagnosis not present

## 2021-02-28 DIAGNOSIS — D696 Thrombocytopenia, unspecified: Secondary | ICD-10-CM | POA: Diagnosis not present

## 2021-02-28 DIAGNOSIS — I10 Essential (primary) hypertension: Secondary | ICD-10-CM | POA: Diagnosis not present

## 2021-02-28 DIAGNOSIS — D649 Anemia, unspecified: Secondary | ICD-10-CM | POA: Diagnosis not present

## 2021-03-01 DIAGNOSIS — E119 Type 2 diabetes mellitus without complications: Secondary | ICD-10-CM | POA: Diagnosis not present

## 2021-03-01 DIAGNOSIS — Z48815 Encounter for surgical aftercare following surgery on the digestive system: Secondary | ICD-10-CM | POA: Diagnosis not present

## 2021-03-01 DIAGNOSIS — Z432 Encounter for attention to ileostomy: Secondary | ICD-10-CM | POA: Diagnosis not present

## 2021-03-01 DIAGNOSIS — K219 Gastro-esophageal reflux disease without esophagitis: Secondary | ICD-10-CM | POA: Diagnosis not present

## 2021-03-01 DIAGNOSIS — Z9049 Acquired absence of other specified parts of digestive tract: Secondary | ICD-10-CM | POA: Diagnosis not present

## 2021-03-01 DIAGNOSIS — I10 Essential (primary) hypertension: Secondary | ICD-10-CM | POA: Diagnosis not present

## 2021-03-01 DIAGNOSIS — K5730 Diverticulosis of large intestine without perforation or abscess without bleeding: Secondary | ICD-10-CM | POA: Diagnosis not present

## 2021-03-01 DIAGNOSIS — D696 Thrombocytopenia, unspecified: Secondary | ICD-10-CM | POA: Diagnosis not present

## 2021-03-01 DIAGNOSIS — E785 Hyperlipidemia, unspecified: Secondary | ICD-10-CM | POA: Diagnosis not present

## 2021-03-01 DIAGNOSIS — M1990 Unspecified osteoarthritis, unspecified site: Secondary | ICD-10-CM | POA: Diagnosis not present

## 2021-03-01 DIAGNOSIS — D649 Anemia, unspecified: Secondary | ICD-10-CM | POA: Diagnosis not present

## 2021-03-03 DIAGNOSIS — D649 Anemia, unspecified: Secondary | ICD-10-CM | POA: Diagnosis not present

## 2021-03-03 DIAGNOSIS — D696 Thrombocytopenia, unspecified: Secondary | ICD-10-CM | POA: Diagnosis not present

## 2021-03-03 DIAGNOSIS — Z9049 Acquired absence of other specified parts of digestive tract: Secondary | ICD-10-CM | POA: Diagnosis not present

## 2021-03-03 DIAGNOSIS — Z48815 Encounter for surgical aftercare following surgery on the digestive system: Secondary | ICD-10-CM | POA: Diagnosis not present

## 2021-03-03 DIAGNOSIS — E785 Hyperlipidemia, unspecified: Secondary | ICD-10-CM | POA: Diagnosis not present

## 2021-03-03 DIAGNOSIS — K219 Gastro-esophageal reflux disease without esophagitis: Secondary | ICD-10-CM | POA: Diagnosis not present

## 2021-03-03 DIAGNOSIS — I10 Essential (primary) hypertension: Secondary | ICD-10-CM | POA: Diagnosis not present

## 2021-03-03 DIAGNOSIS — Z432 Encounter for attention to ileostomy: Secondary | ICD-10-CM | POA: Diagnosis not present

## 2021-03-03 DIAGNOSIS — E119 Type 2 diabetes mellitus without complications: Secondary | ICD-10-CM | POA: Diagnosis not present

## 2021-03-03 DIAGNOSIS — M1990 Unspecified osteoarthritis, unspecified site: Secondary | ICD-10-CM | POA: Diagnosis not present

## 2021-03-03 DIAGNOSIS — K5730 Diverticulosis of large intestine without perforation or abscess without bleeding: Secondary | ICD-10-CM | POA: Diagnosis not present

## 2021-03-06 ENCOUNTER — Ambulatory Visit: Payer: Medicare Other

## 2021-03-06 ENCOUNTER — Ambulatory Visit: Payer: Self-pay

## 2021-03-06 DIAGNOSIS — Z9049 Acquired absence of other specified parts of digestive tract: Secondary | ICD-10-CM | POA: Diagnosis not present

## 2021-03-06 DIAGNOSIS — M1990 Unspecified osteoarthritis, unspecified site: Secondary | ICD-10-CM | POA: Diagnosis not present

## 2021-03-06 DIAGNOSIS — Z48815 Encounter for surgical aftercare following surgery on the digestive system: Secondary | ICD-10-CM | POA: Diagnosis not present

## 2021-03-06 DIAGNOSIS — K5730 Diverticulosis of large intestine without perforation or abscess without bleeding: Secondary | ICD-10-CM | POA: Diagnosis not present

## 2021-03-06 DIAGNOSIS — D649 Anemia, unspecified: Secondary | ICD-10-CM | POA: Diagnosis not present

## 2021-03-06 DIAGNOSIS — I10 Essential (primary) hypertension: Secondary | ICD-10-CM | POA: Diagnosis not present

## 2021-03-06 DIAGNOSIS — D696 Thrombocytopenia, unspecified: Secondary | ICD-10-CM | POA: Diagnosis not present

## 2021-03-06 DIAGNOSIS — E785 Hyperlipidemia, unspecified: Secondary | ICD-10-CM | POA: Diagnosis not present

## 2021-03-06 DIAGNOSIS — Z432 Encounter for attention to ileostomy: Secondary | ICD-10-CM | POA: Diagnosis not present

## 2021-03-06 DIAGNOSIS — E119 Type 2 diabetes mellitus without complications: Secondary | ICD-10-CM | POA: Diagnosis not present

## 2021-03-06 DIAGNOSIS — K219 Gastro-esophageal reflux disease without esophagitis: Secondary | ICD-10-CM | POA: Diagnosis not present

## 2021-03-06 NOTE — Telephone Encounter (Signed)
  Pt has appt tomorrow with PCP. Pt wanted to know if she needs to fast for any potential labs? Please call pt if need to fast or not. Pt has diabetic meds that she takes with food.        Reason for Disposition  [1] Caller requesting NON-URGENT health information AND [2] PCP's office is the best resource  Answer Assessment - Initial Assessment Questions 1. REASON FOR CALL or QUESTION: "What is your reason for calling today?" or "How can I best help you?" or "What question do you have that I can help answer?"     Pt has appt tomorrow with PCP. Pt wanted to know if she needs to fast for any potential labs? Please call pt if need to fast or no  Protocols used: Information Only Call - No Triage-A-AH

## 2021-03-07 ENCOUNTER — Encounter: Payer: Self-pay | Admitting: Family Medicine

## 2021-03-07 ENCOUNTER — Telehealth: Payer: Self-pay

## 2021-03-07 ENCOUNTER — Ambulatory Visit (INDEPENDENT_AMBULATORY_CARE_PROVIDER_SITE_OTHER): Payer: Medicare Other | Admitting: Family Medicine

## 2021-03-07 ENCOUNTER — Other Ambulatory Visit: Payer: Self-pay

## 2021-03-07 ENCOUNTER — Other Ambulatory Visit: Payer: Self-pay | Admitting: Family Medicine

## 2021-03-07 VITALS — BP 120/70 | HR 80 | Ht 68.0 in | Wt 159.0 lb

## 2021-03-07 DIAGNOSIS — I10 Essential (primary) hypertension: Secondary | ICD-10-CM | POA: Diagnosis not present

## 2021-03-07 DIAGNOSIS — I872 Venous insufficiency (chronic) (peripheral): Secondary | ICD-10-CM

## 2021-03-07 DIAGNOSIS — E119 Type 2 diabetes mellitus without complications: Secondary | ICD-10-CM | POA: Diagnosis not present

## 2021-03-07 DIAGNOSIS — N3001 Acute cystitis with hematuria: Secondary | ICD-10-CM

## 2021-03-07 LAB — POCT URINALYSIS DIPSTICK
Bilirubin, UA: NEGATIVE
Glucose, UA: NEGATIVE
Ketones, UA: NEGATIVE
Nitrite, UA: NEGATIVE
Protein, UA: POSITIVE — AB
Spec Grav, UA: <=1.005 — AB (ref 1.010–1.025)
Urobilinogen, UA: 0.2 E.U./dL
pH, UA: 7 (ref 5.0–8.0)

## 2021-03-07 MED ORDER — CEPHALEXIN 500 MG PO CAPS: 500.0000 mg | ORAL_CAPSULE | Freq: Two times a day (BID) | ORAL | 0 refills | Status: DC

## 2021-03-07 NOTE — Patient Instructions (Signed)
Telogen Effluvium  Telogen effluvium is a condition in which the body sheds much hair. People describe that they are losing hair "from the roots" in excessive amounts. The hair loss is usually 3 to 6 months following an event. The inciting event may be childbirth, a surgery, an illness with a fever, a traumatic psychological event, weight loss, or the start of a new medication. Some people have no known inciting event. For most people, no treatment is necessary, and the hair will stop falling out and begin to regrow with time. Some women develop a chronic form of telogen effluvium in which they continue to lose hair at an accelerated rate.

## 2021-03-07 NOTE — Telephone Encounter (Signed)
Tried calling patient husband Marden Noble to let him know if they cannot collect patients urine and bring it back- Dr Ronnald Ramp said not to stress over it.   Unable to leave VM.

## 2021-03-07 NOTE — Telephone Encounter (Signed)
Pt's husband returned call, understands message, and plans to bring in urine specimen tomorrow morning

## 2021-03-07 NOTE — Telephone Encounter (Signed)
Noted! Thank you

## 2021-03-07 NOTE — Progress Notes (Signed)
Date:  03/07/2021   Name:  Frances Harmon   DOB:  May 31, 1946   MRN:  [id number]   Chief Complaint: Follow-up, Hypertension, and Diabetes (Change glipizide to capsule?)  Hypertension This is a chronic problem. The current episode started more than 1 year ago. The problem has been gradually improving since onset. The problem is controlled. Associated symptoms include chest pain. Pertinent negatives include no anxiety, blurred vision, headaches, malaise/fatigue, neck pain, orthopnea, palpitations, peripheral edema, PND, shortness of breath or sweats. The current treatment provides moderate improvement. There are no compliance problems.   Diabetes She presents for her follow-up diabetic visit. She has type 2 diabetes mellitus. Her disease course has been stable. Pertinent negatives for hypoglycemia include no dizziness, headaches, nervousness/anxiousness or sweats. Associated symptoms include chest pain. Pertinent negatives for diabetes include no blurred vision, no fatigue, no foot paresthesias, no foot ulcerations, no polydipsia, no polyphagia, no polyuria, no visual change, no weakness and no weight loss. Symptoms are stable. She is following a generally healthy diet. An ACE inhibitor/angiotensin II receptor blocker is not being taken.   Lab Results  Component Value Date   CREATININE 0.81 07/27/2020   BUN 13 07/27/2020   NA 142 07/27/2020   K 4.4 07/27/2020   CL 104 07/27/2020   CO2 27 07/27/2020   Lab Results  Component Value Date   CHOL 143 09/06/2020   HDL 51 09/06/2020   LDLCALC 72 09/06/2020   TRIG 109 09/06/2020   CHOLHDL 2.4 09/12/2017   No results found for: TSH Lab Results  Component Value Date   HGBA1C 5.8 (H) 09/06/2020   Lab Results  Component Value Date   WBC 5.9 07/27/2020   HGB 12.8 07/27/2020   HCT 38.9 07/27/2020   MCV 88.0 07/27/2020   PLT 162 07/27/2020   Lab Results  Component Value Date   ALT 11 10/22/2019   AST 13 10/22/2019   ALKPHOS 90  10/22/2019   BILITOT 0.5 10/22/2019     Review of Systems  Constitutional:  Negative for chills, fatigue, fever, malaise/fatigue and weight loss.  HENT:  Negative for drooling, ear discharge, ear pain and sore throat.   Eyes:  Negative for blurred vision.  Respiratory:  Negative for cough, shortness of breath and wheezing.   Cardiovascular:  Positive for chest pain. Negative for palpitations, orthopnea, leg swelling and PND.  Gastrointestinal:  Negative for abdominal pain, blood in stool, constipation, diarrhea and nausea.  Endocrine: Negative for polydipsia, polyphagia and polyuria.  Genitourinary:  Negative for dysuria, frequency, hematuria and urgency.  Musculoskeletal:  Negative for back pain, myalgias and neck pain.  Skin:  Negative for rash.  Allergic/Immunologic: Negative for environmental allergies.  Neurological:  Negative for dizziness, weakness and headaches.  Hematological:  Does not bruise/bleed easily.  Psychiatric/Behavioral:  Negative for suicidal ideas. The patient is not nervous/anxious.    Patient Active Problem List   Diagnosis Date Noted   H/O total hysterectomy with bilateral salpingo-oophorectomy (BSO) 09/06/2020   Thrombocytopenia ([place]) 04/23/2019   Type 2 diabetes mellitus with complication, without long-term current use of insulin ([place]) 04/28/2015   Essential hypertension 04/28/2015   Hyperlipidemia 04/28/2015   Esophageal reflux 04/28/2015   Vitamin D deficiency 04/28/2015    No Known Allergies  Past Surgical History:  Procedure Laterality Date   COLONOSCOPY  2009   repeat in 5 years- Dr Bary Leriche   COLONOSCOPY WITH PROPOFOL N/A 09/09/2015   Procedure: COLONOSCOPY WITH PROPOFOL;  Surgeon: Hulen Luster, MD;  Location: [facility] ENDOSCOPY;  Service: Gastroenterology;  Laterality: N/A;   FOOT SURGERY Bilateral    Bunion removal   INCONTINENCE SURGERY     VAGINAL HYSTERECTOMY     VENTRAL HERNIA REPAIR N/A 05/06/2015   Procedure: HERNIA REPAIR VENTRAL ADULT;   Surgeon: Leonie Green, MD;  Location: [facility] ORS;  Service: General;  Laterality: N/A;    Social History   Tobacco Use   Smoking status: Never   Smokeless tobacco: Never   Tobacco comments:    smokin cessation materials not required  Vaping Use   Vaping Use: Never used  Substance Use Topics   Alcohol use: No    Alcohol/week: 0.0 standard drinks   Drug use: No     Medication list has been reviewed and updated.  Current Meds  Medication Sig   amLODipine (NORVASC) 5 MG tablet Take by mouth.   atorvastatin (LIPITOR) 20 MG tablet Take by mouth.   glipiZIDE (GLUCOTROL XL) 2.5 MG 24 hr tablet TAKE (1) TABLET BY MOUTH EVERY DAY AT THE SAME TIME EVERY MORNING   metFORMIN (GLUCOPHAGE) 500 MG tablet TAKE (1) TABLET BY MOUTH TWICE DAILY   omeprazole (PRILOSEC) 20 MG capsule TAKE (1) CAPSULE BY MOUTH EVERY MORNING AT THE SAME TIME   triamcinolone (KENALOG) 0.1 % Apply 1 application topically 2 (two) times daily.    PHQ 2/9 Scores 03/07/2021 10/25/2020 09/06/2020 05/02/2020  PHQ - 2 Score 0 0 0 0  PHQ- 9 Score 2 0 0 0    GAD 7 : Generalized Anxiety Score 03/07/2021 10/25/2020 09/06/2020 05/02/2020  Nervous, Anxious, on Edge 0 0 0 0  Control/stop worrying 0 0 0 0  Worry too much - different things 0 0 0 0  Trouble relaxing 0 0 0 0  Restless 0 0 0 0  Easily annoyed or irritable 0 0 0 0  Afraid - awful might happen 0 0 0 0  Total GAD 7 Score 0 0 0 0  Anxiety Difficulty - - - -    BP Readings from Last 3 Encounters:  03/07/21 120/70  10/25/20 130/80  09/06/20 138/80    Physical Exam Vitals and nursing note reviewed.  Constitutional:      Appearance: She is well-developed.  HENT:     Head: Normocephalic.     Right Ear: Tympanic membrane, ear canal and external ear normal.     Left Ear: Tympanic membrane, ear canal and external ear normal.  Eyes:     General: Lids are everted, no foreign bodies appreciated. No scleral icterus.       Left eye: No foreign body or hordeolum.      Conjunctiva/sclera: Conjunctivae normal.     Right eye: Right conjunctiva is not injected.     Left eye: Left conjunctiva is not injected.     Pupils: Pupils are equal, round, and reactive to light.  Neck:     Thyroid: No thyromegaly.     Vascular: No JVD.     Trachea: No tracheal deviation.  Cardiovascular:     Rate and Rhythm: Normal rate and regular rhythm.     Heart sounds: Normal heart sounds, S1 normal and S2 normal. No murmur heard. No systolic murmur is present.  No diastolic murmur is present.    No friction rub. No gallop. No S3 or S4 sounds.  Pulmonary:     Effort: Pulmonary effort is normal. No respiratory distress.     Breath sounds: Normal breath sounds. No wheezing or rales.  Abdominal:  General: Bowel sounds are normal.     Palpations: Abdomen is soft. There is no mass.     Tenderness: There is no abdominal tenderness. There is no guarding or rebound.  Musculoskeletal:        General: No tenderness. Normal range of motion.     Cervical back: Normal range of motion and neck supple.  Lymphadenopathy:     Cervical: No cervical adenopathy.  Skin:    General: Skin is warm.     Findings: No rash.  Neurological:     Mental Status: She is alert and oriented to person, place, and time.     Cranial Nerves: No cranial nerve deficit.     Deep Tendon Reflexes: Reflexes normal.  Psychiatric:        Mood and Affect: Mood is not anxious or depressed.    Wt Readings from Last 3 Encounters:  03/07/21 159 lb (72.1 kg)  10/25/20 200 lb (90.7 kg)  09/06/20 203 lb (92.1 kg)    BP 120/70   Pulse 80   Ht '5\' 8"'$  (1.727 m)   Wt 159 lb (72.1 kg)   BMI 24.18 kg/m   Assessment and Plan:  1. Essential hypertension Chronic.  Controlled.  Stable.  Blood pressure 120/70.  Patient had lisinopril hydrochlorothiazide removed but is currently being controlled on amlodipine 5 mg once a day.  We will recheck patient in 3 to 4 months.  2. Type 2 diabetes mellitus without  complication, without long-term current use of insulin (HCC) Chronic.  Controlled.  Stable.  Patient is currently on glipizide 2.5 XL and metformin.  We will check A1c and adjust accordingly. - HgB A1c  3. Venous insufficiency New onset.  Patient's had swelling in her feet but she has had dependent with decreased walking as much.  Exercising of the feet has been suggested to help circulate the lower extremity accumulation and patient's been encouraged to elevate legs throughout the day.  4. Acute cystitis with hematuria Patient is having symptoms of dysuria and frequency.  Urinalysis notes white cells consistent with a UTI.  We will check a urine culture seen that she is in the hospital to make sure she does not have a resistant organism in the meantime we will treat with cephalexin 500 mg twice a day for 5 days. - POCT urinalysis dipstick - Urine Culture - cephALEXin (KEFLEX) 500 MG capsule; Take 1 capsule (500 mg total) by mouth 2 (two) times daily.  Dispense: 10 capsule; Refill: 0

## 2021-03-08 DIAGNOSIS — I10 Essential (primary) hypertension: Secondary | ICD-10-CM | POA: Diagnosis not present

## 2021-03-08 DIAGNOSIS — E785 Hyperlipidemia, unspecified: Secondary | ICD-10-CM | POA: Diagnosis not present

## 2021-03-08 DIAGNOSIS — K5730 Diverticulosis of large intestine without perforation or abscess without bleeding: Secondary | ICD-10-CM | POA: Diagnosis not present

## 2021-03-08 DIAGNOSIS — E119 Type 2 diabetes mellitus without complications: Secondary | ICD-10-CM | POA: Diagnosis not present

## 2021-03-08 DIAGNOSIS — K219 Gastro-esophageal reflux disease without esophagitis: Secondary | ICD-10-CM | POA: Diagnosis not present

## 2021-03-08 DIAGNOSIS — Z48815 Encounter for surgical aftercare following surgery on the digestive system: Secondary | ICD-10-CM | POA: Diagnosis not present

## 2021-03-08 DIAGNOSIS — D696 Thrombocytopenia, unspecified: Secondary | ICD-10-CM | POA: Diagnosis not present

## 2021-03-08 DIAGNOSIS — Z9049 Acquired absence of other specified parts of digestive tract: Secondary | ICD-10-CM | POA: Diagnosis not present

## 2021-03-08 DIAGNOSIS — M1990 Unspecified osteoarthritis, unspecified site: Secondary | ICD-10-CM | POA: Diagnosis not present

## 2021-03-08 DIAGNOSIS — Z432 Encounter for attention to ileostomy: Secondary | ICD-10-CM | POA: Diagnosis not present

## 2021-03-08 DIAGNOSIS — D649 Anemia, unspecified: Secondary | ICD-10-CM | POA: Diagnosis not present

## 2021-03-08 LAB — HEMOGLOBIN A1C
Est. average glucose Bld gHb Est-mCnc: 123 mg/dL
Hgb A1c MFr Bld: 5.9 % — ABNORMAL HIGH (ref 4.8–5.6)

## 2021-03-10 LAB — URINE CULTURE: Organism ID, Bacteria: NO GROWTH

## 2021-03-13 DIAGNOSIS — K5730 Diverticulosis of large intestine without perforation or abscess without bleeding: Secondary | ICD-10-CM | POA: Diagnosis not present

## 2021-03-13 DIAGNOSIS — D696 Thrombocytopenia, unspecified: Secondary | ICD-10-CM | POA: Diagnosis not present

## 2021-03-13 DIAGNOSIS — D649 Anemia, unspecified: Secondary | ICD-10-CM | POA: Diagnosis not present

## 2021-03-13 DIAGNOSIS — Z9049 Acquired absence of other specified parts of digestive tract: Secondary | ICD-10-CM | POA: Diagnosis not present

## 2021-03-13 DIAGNOSIS — Z432 Encounter for attention to ileostomy: Secondary | ICD-10-CM | POA: Diagnosis not present

## 2021-03-13 DIAGNOSIS — K219 Gastro-esophageal reflux disease without esophagitis: Secondary | ICD-10-CM | POA: Diagnosis not present

## 2021-03-13 DIAGNOSIS — E119 Type 2 diabetes mellitus without complications: Secondary | ICD-10-CM | POA: Diagnosis not present

## 2021-03-13 DIAGNOSIS — M1990 Unspecified osteoarthritis, unspecified site: Secondary | ICD-10-CM | POA: Diagnosis not present

## 2021-03-13 DIAGNOSIS — Z48815 Encounter for surgical aftercare following surgery on the digestive system: Secondary | ICD-10-CM | POA: Diagnosis not present

## 2021-03-13 DIAGNOSIS — E785 Hyperlipidemia, unspecified: Secondary | ICD-10-CM | POA: Diagnosis not present

## 2021-03-13 DIAGNOSIS — I10 Essential (primary) hypertension: Secondary | ICD-10-CM | POA: Diagnosis not present

## 2021-03-14 ENCOUNTER — Telehealth: Payer: Self-pay

## 2021-03-14 DIAGNOSIS — Z9889 Other specified postprocedural states: Secondary | ICD-10-CM | POA: Diagnosis not present

## 2021-03-14 DIAGNOSIS — Z9049 Acquired absence of other specified parts of digestive tract: Secondary | ICD-10-CM | POA: Diagnosis not present

## 2021-03-14 DIAGNOSIS — Z87440 Personal history of urinary (tract) infections: Secondary | ICD-10-CM | POA: Diagnosis not present

## 2021-03-14 DIAGNOSIS — N179 Acute kidney failure, unspecified: Secondary | ICD-10-CM | POA: Diagnosis not present

## 2021-03-14 DIAGNOSIS — E875 Hyperkalemia: Secondary | ICD-10-CM | POA: Diagnosis not present

## 2021-03-14 DIAGNOSIS — Z960 Presence of urogenital implants: Secondary | ICD-10-CM | POA: Diagnosis not present

## 2021-03-14 NOTE — Telephone Encounter (Signed)
Patients husband Legrand Como called saying he is concerned about his wife. She is currently at the [facility] and her potassium this morning was 6.2.   He wants a call back to discuss this, and asked if there is anything she can be prescribed to lower her potassium.  Please call husband back. Thank you.

## 2021-03-15 DIAGNOSIS — Z48815 Encounter for surgical aftercare following surgery on the digestive system: Secondary | ICD-10-CM | POA: Diagnosis not present

## 2021-03-15 DIAGNOSIS — T8189XA Other complications of procedures, not elsewhere classified, initial encounter: Secondary | ICD-10-CM | POA: Diagnosis not present

## 2021-03-15 DIAGNOSIS — Z9049 Acquired absence of other specified parts of digestive tract: Secondary | ICD-10-CM | POA: Diagnosis not present

## 2021-03-15 DIAGNOSIS — I10 Essential (primary) hypertension: Secondary | ICD-10-CM | POA: Diagnosis not present

## 2021-03-15 DIAGNOSIS — E785 Hyperlipidemia, unspecified: Secondary | ICD-10-CM | POA: Diagnosis not present

## 2021-03-15 DIAGNOSIS — D696 Thrombocytopenia, unspecified: Secondary | ICD-10-CM | POA: Diagnosis not present

## 2021-03-15 DIAGNOSIS — K219 Gastro-esophageal reflux disease without esophagitis: Secondary | ICD-10-CM | POA: Diagnosis not present

## 2021-03-15 DIAGNOSIS — Z932 Ileostomy status: Secondary | ICD-10-CM | POA: Diagnosis not present

## 2021-03-15 DIAGNOSIS — Z432 Encounter for attention to ileostomy: Secondary | ICD-10-CM | POA: Diagnosis not present

## 2021-03-15 DIAGNOSIS — E119 Type 2 diabetes mellitus without complications: Secondary | ICD-10-CM | POA: Diagnosis not present

## 2021-03-15 DIAGNOSIS — M1990 Unspecified osteoarthritis, unspecified site: Secondary | ICD-10-CM | POA: Diagnosis not present

## 2021-03-15 DIAGNOSIS — K5730 Diverticulosis of large intestine without perforation or abscess without bleeding: Secondary | ICD-10-CM | POA: Diagnosis not present

## 2021-03-15 DIAGNOSIS — D649 Anemia, unspecified: Secondary | ICD-10-CM | POA: Diagnosis not present

## 2021-03-16 DIAGNOSIS — E785 Hyperlipidemia, unspecified: Secondary | ICD-10-CM | POA: Diagnosis not present

## 2021-03-16 DIAGNOSIS — K219 Gastro-esophageal reflux disease without esophagitis: Secondary | ICD-10-CM | POA: Diagnosis not present

## 2021-03-16 DIAGNOSIS — Z9049 Acquired absence of other specified parts of digestive tract: Secondary | ICD-10-CM | POA: Diagnosis not present

## 2021-03-16 DIAGNOSIS — Z48815 Encounter for surgical aftercare following surgery on the digestive system: Secondary | ICD-10-CM | POA: Diagnosis not present

## 2021-03-16 DIAGNOSIS — M1990 Unspecified osteoarthritis, unspecified site: Secondary | ICD-10-CM | POA: Diagnosis not present

## 2021-03-16 DIAGNOSIS — Z432 Encounter for attention to ileostomy: Secondary | ICD-10-CM | POA: Diagnosis not present

## 2021-03-16 DIAGNOSIS — D649 Anemia, unspecified: Secondary | ICD-10-CM | POA: Diagnosis not present

## 2021-03-16 DIAGNOSIS — K5730 Diverticulosis of large intestine without perforation or abscess without bleeding: Secondary | ICD-10-CM | POA: Diagnosis not present

## 2021-03-16 DIAGNOSIS — D696 Thrombocytopenia, unspecified: Secondary | ICD-10-CM | POA: Diagnosis not present

## 2021-03-16 DIAGNOSIS — E119 Type 2 diabetes mellitus without complications: Secondary | ICD-10-CM | POA: Diagnosis not present

## 2021-03-16 DIAGNOSIS — I10 Essential (primary) hypertension: Secondary | ICD-10-CM | POA: Diagnosis not present

## 2021-03-17 DIAGNOSIS — Z432 Encounter for attention to ileostomy: Secondary | ICD-10-CM | POA: Diagnosis not present

## 2021-03-17 DIAGNOSIS — K219 Gastro-esophageal reflux disease without esophagitis: Secondary | ICD-10-CM | POA: Diagnosis not present

## 2021-03-17 DIAGNOSIS — M1990 Unspecified osteoarthritis, unspecified site: Secondary | ICD-10-CM | POA: Diagnosis not present

## 2021-03-17 DIAGNOSIS — E785 Hyperlipidemia, unspecified: Secondary | ICD-10-CM | POA: Diagnosis not present

## 2021-03-17 DIAGNOSIS — Z9049 Acquired absence of other specified parts of digestive tract: Secondary | ICD-10-CM | POA: Diagnosis not present

## 2021-03-17 DIAGNOSIS — Z48815 Encounter for surgical aftercare following surgery on the digestive system: Secondary | ICD-10-CM | POA: Diagnosis not present

## 2021-03-17 DIAGNOSIS — D649 Anemia, unspecified: Secondary | ICD-10-CM | POA: Diagnosis not present

## 2021-03-17 DIAGNOSIS — E119 Type 2 diabetes mellitus without complications: Secondary | ICD-10-CM | POA: Diagnosis not present

## 2021-03-17 DIAGNOSIS — I10 Essential (primary) hypertension: Secondary | ICD-10-CM | POA: Diagnosis not present

## 2021-03-17 DIAGNOSIS — K5730 Diverticulosis of large intestine without perforation or abscess without bleeding: Secondary | ICD-10-CM | POA: Diagnosis not present

## 2021-03-17 DIAGNOSIS — D696 Thrombocytopenia, unspecified: Secondary | ICD-10-CM | POA: Diagnosis not present

## 2021-03-20 ENCOUNTER — Telehealth: Payer: Self-pay

## 2021-03-20 DIAGNOSIS — Z432 Encounter for attention to ileostomy: Secondary | ICD-10-CM | POA: Diagnosis not present

## 2021-03-20 DIAGNOSIS — Z48815 Encounter for surgical aftercare following surgery on the digestive system: Secondary | ICD-10-CM | POA: Diagnosis not present

## 2021-03-20 DIAGNOSIS — K5730 Diverticulosis of large intestine without perforation or abscess without bleeding: Secondary | ICD-10-CM | POA: Diagnosis not present

## 2021-03-20 DIAGNOSIS — E785 Hyperlipidemia, unspecified: Secondary | ICD-10-CM | POA: Diagnosis not present

## 2021-03-20 DIAGNOSIS — K219 Gastro-esophageal reflux disease without esophagitis: Secondary | ICD-10-CM | POA: Diagnosis not present

## 2021-03-20 DIAGNOSIS — E119 Type 2 diabetes mellitus without complications: Secondary | ICD-10-CM | POA: Diagnosis not present

## 2021-03-20 DIAGNOSIS — M1990 Unspecified osteoarthritis, unspecified site: Secondary | ICD-10-CM | POA: Diagnosis not present

## 2021-03-20 DIAGNOSIS — Z9049 Acquired absence of other specified parts of digestive tract: Secondary | ICD-10-CM | POA: Diagnosis not present

## 2021-03-20 DIAGNOSIS — D696 Thrombocytopenia, unspecified: Secondary | ICD-10-CM | POA: Diagnosis not present

## 2021-03-20 DIAGNOSIS — I10 Essential (primary) hypertension: Secondary | ICD-10-CM | POA: Diagnosis not present

## 2021-03-20 DIAGNOSIS — D649 Anemia, unspecified: Secondary | ICD-10-CM | POA: Diagnosis not present

## 2021-03-20 NOTE — Telephone Encounter (Signed)
Copied from [place] [id number]. Topic: General - Call Back - No Documentation >> Mar 20, 2021 11:44 AM Erick Blinks wrote: Pt has questions for Baxter Flattery, declined to disclose more info. please advise  Best contact: [phone]

## 2021-03-21 ENCOUNTER — Other Ambulatory Visit: Payer: Self-pay

## 2021-03-21 ENCOUNTER — Encounter: Payer: Self-pay | Admitting: Family Medicine

## 2021-03-21 ENCOUNTER — Ambulatory Visit (INDEPENDENT_AMBULATORY_CARE_PROVIDER_SITE_OTHER): Payer: Medicare Other | Admitting: Family Medicine

## 2021-03-21 VITALS — BP 110/80 | HR 80 | Ht 68.0 in | Wt 158.0 lb

## 2021-03-21 DIAGNOSIS — E875 Hyperkalemia: Secondary | ICD-10-CM | POA: Diagnosis not present

## 2021-03-21 NOTE — Progress Notes (Signed)
Date:  03/21/2021   Name:  Frances Harmon   DOB:  1946-04-17   MRN:  [id number]   Chief Complaint: hyperkalemia (Recheck potassium level)  Patient is a 75 year old female who presents for a comprehensive physical exam. The patient reports the following problems: hyperkalemia. Health maintenance has been reviewed up to date.     Lab Results  Component Value Date   CREATININE 0.81 07/27/2020   BUN 13 07/27/2020   NA 142 07/27/2020   K 4.4 07/27/2020   CL 104 07/27/2020   CO2 27 07/27/2020   Lab Results  Component Value Date   CHOL 143 09/06/2020   HDL 51 09/06/2020   LDLCALC 72 09/06/2020   TRIG 109 09/06/2020   CHOLHDL 2.4 09/12/2017   No results found for: TSH Lab Results  Component Value Date   HGBA1C 5.9 (H) 03/07/2021   Lab Results  Component Value Date   WBC 5.9 07/27/2020   HGB 12.8 07/27/2020   HCT 38.9 07/27/2020   MCV 88.0 07/27/2020   PLT 162 07/27/2020   Lab Results  Component Value Date   ALT 11 10/22/2019   AST 13 10/22/2019   ALKPHOS 90 10/22/2019   BILITOT 0.5 10/22/2019     Review of Systems  Constitutional:  Negative for chills, fatigue and fever.  HENT:  Negative for drooling, ear discharge, ear pain and sore throat.   Respiratory:  Negative for cough, shortness of breath and wheezing.   Cardiovascular:  Negative for chest pain, palpitations and leg swelling.  Gastrointestinal:  Negative for abdominal pain, blood in stool, constipation, diarrhea and nausea.  Endocrine: Negative for polydipsia and polyuria.  Genitourinary:  Negative for dysuria, frequency, hematuria and urgency.  Musculoskeletal:  Negative for back pain, myalgias and neck pain.  Skin:  Negative for rash.  Allergic/Immunologic: Negative for environmental allergies.  Neurological:  Negative for dizziness and headaches.  Hematological:  Does not bruise/bleed easily.  Psychiatric/Behavioral:  Negative for suicidal ideas. The patient is not nervous/anxious.    Patient  Active Problem List   Diagnosis Date Noted   H/O total hysterectomy with bilateral salpingo-oophorectomy (BSO) 09/06/2020   Thrombocytopenia ([place]) 04/23/2019   Type 2 diabetes mellitus with complication, without long-term current use of insulin ([place]) 04/28/2015   Essential hypertension 04/28/2015   Hyperlipidemia 04/28/2015   Esophageal reflux 04/28/2015   Vitamin D deficiency 04/28/2015    No Known Allergies  Past Surgical History:  Procedure Laterality Date   COLONOSCOPY  2009   repeat in 5 years- Dr Bary Leriche   COLONOSCOPY WITH PROPOFOL N/A 09/09/2015   Procedure: COLONOSCOPY WITH PROPOFOL;  Surgeon: Hulen Luster, MD;  Location: [facility] ENDOSCOPY;  Service: Gastroenterology;  Laterality: N/A;   FOOT SURGERY Bilateral    Bunion removal   INCONTINENCE SURGERY     VAGINAL HYSTERECTOMY     VENTRAL HERNIA REPAIR N/A 05/06/2015   Procedure: HERNIA REPAIR VENTRAL ADULT;  Surgeon: Leonie Green, MD;  Location: [facility] ORS;  Service: General;  Laterality: N/A;    Social History   Tobacco Use   Smoking status: Never   Smokeless tobacco: Never   Tobacco comments:    smokin cessation materials not required  Vaping Use   Vaping Use: Never used  Substance Use Topics   Alcohol use: No    Alcohol/week: 0.0 standard drinks   Drug use: No     Medication list has been reviewed and updated.  Current Meds  Medication Sig   amLODipine (  NORVASC) 5 MG tablet Take by mouth.   atorvastatin (LIPITOR) 20 MG tablet Take by mouth.   glipiZIDE (GLUCOTROL XL) 2.5 MG 24 hr tablet TAKE (1) TABLET BY MOUTH EVERY DAY AT THE SAME TIME EVERY MORNING   metFORMIN (GLUCOPHAGE) 500 MG tablet TAKE (1) TABLET BY MOUTH TWICE DAILY   omeprazole (PRILOSEC) 20 MG capsule TAKE (1) CAPSULE BY MOUTH EVERY MORNING AT THE SAME TIME   triamcinolone (KENALOG) 0.1 % Apply 1 application topically 2 (two) times daily.   [DISCONTINUED] cephALEXin (KEFLEX) 500 MG capsule Take 1 capsule (500 mg total) by mouth 2 (two) times  daily.   [DISCONTINUED] docusate sodium (COLACE) 50 MG capsule Take 1 capsule (50 mg total) by mouth daily.    PHQ 2/9 Scores 03/07/2021 10/25/2020 09/06/2020 05/02/2020  PHQ - 2 Score 0 0 0 0  PHQ- 9 Score 2 0 0 0    GAD 7 : Generalized Anxiety Score 03/07/2021 10/25/2020 09/06/2020 05/02/2020  Nervous, Anxious, on Edge 0 0 0 0  Control/stop worrying 0 0 0 0  Worry too much - different things 0 0 0 0  Trouble relaxing 0 0 0 0  Restless 0 0 0 0  Easily annoyed or irritable 0 0 0 0  Afraid - awful might happen 0 0 0 0  Total GAD 7 Score 0 0 0 0  Anxiety Difficulty - - - -    BP Readings from Last 3 Encounters:  03/21/21 110/80  03/07/21 120/70  10/25/20 130/80    Physical Exam Vitals and nursing note reviewed.  Constitutional:      Appearance: She is well-developed.  HENT:     Head: Normocephalic.     Right Ear: Tympanic membrane, ear canal and external ear normal.     Left Ear: Tympanic membrane, ear canal and external ear normal.  Eyes:     General: Lids are everted, no foreign bodies appreciated. No scleral icterus.       Left eye: No foreign body or hordeolum.     Conjunctiva/sclera: Conjunctivae normal.     Right eye: Right conjunctiva is not injected.     Left eye: Left conjunctiva is not injected.     Pupils: Pupils are equal, round, and reactive to light.  Neck:     Thyroid: No thyromegaly.     Vascular: No JVD.     Trachea: No tracheal deviation.  Cardiovascular:     Rate and Rhythm: Normal rate and regular rhythm.     Pulses: Normal pulses.     Heart sounds: Normal heart sounds, S1 normal and S2 normal. No murmur heard. No systolic murmur is present.  No diastolic murmur is present.    No friction rub. No gallop. No S3 or S4 sounds.  Pulmonary:     Effort: Pulmonary effort is normal. No respiratory distress.     Breath sounds: Normal breath sounds. No wheezing or rales.  Abdominal:     General: Bowel sounds are normal.     Palpations: Abdomen is soft. There  is no hepatomegaly, splenomegaly or mass.     Tenderness: There is no abdominal tenderness. There is no guarding or rebound.  Musculoskeletal:        General: No tenderness. Normal range of motion.     Cervical back: Normal range of motion and neck supple.  Lymphadenopathy:     Cervical: No cervical adenopathy.  Skin:    General: Skin is warm.     Findings: No rash.  Neurological:  Mental Status: She is alert and oriented to person, place, and time.     Cranial Nerves: No cranial nerve deficit.     Deep Tendon Reflexes: Reflexes normal.  Psychiatric:        Mood and Affect: Mood is not anxious or depressed.    Wt Readings from Last 3 Encounters:  03/21/21 158 lb (71.7 kg)  03/07/21 159 lb (72.1 kg)  10/25/20 200 lb (90.7 kg)    BP 110/80   Pulse 80   Ht '5\' 8"'$  (1.727 m)   Wt 158 lb (71.7 kg)   BMI 24.02 kg/m   Assessment and Plan:  1. Hyperkalemia New onset.  Episodic.  Patient was evaluated a week ago admitted with elevated potassium which was relieved with hydration.  We will recheck a renal function panel for electrolytes and GFR as well. - Renal Function Panel  2. Hypomagnesemia Noted to have a low magnesium at that time as well and was provided magnesium IV.  We will check current status of magnesium and treatment accordingly. - Magnesium

## 2021-03-22 DIAGNOSIS — K5730 Diverticulosis of large intestine without perforation or abscess without bleeding: Secondary | ICD-10-CM | POA: Diagnosis not present

## 2021-03-22 DIAGNOSIS — Z9049 Acquired absence of other specified parts of digestive tract: Secondary | ICD-10-CM | POA: Diagnosis not present

## 2021-03-22 DIAGNOSIS — E119 Type 2 diabetes mellitus without complications: Secondary | ICD-10-CM | POA: Diagnosis not present

## 2021-03-22 DIAGNOSIS — D649 Anemia, unspecified: Secondary | ICD-10-CM | POA: Diagnosis not present

## 2021-03-22 DIAGNOSIS — E785 Hyperlipidemia, unspecified: Secondary | ICD-10-CM | POA: Diagnosis not present

## 2021-03-22 DIAGNOSIS — K219 Gastro-esophageal reflux disease without esophagitis: Secondary | ICD-10-CM | POA: Diagnosis not present

## 2021-03-22 DIAGNOSIS — M1990 Unspecified osteoarthritis, unspecified site: Secondary | ICD-10-CM | POA: Diagnosis not present

## 2021-03-22 DIAGNOSIS — I10 Essential (primary) hypertension: Secondary | ICD-10-CM | POA: Diagnosis not present

## 2021-03-22 DIAGNOSIS — Z432 Encounter for attention to ileostomy: Secondary | ICD-10-CM | POA: Diagnosis not present

## 2021-03-22 DIAGNOSIS — Z48815 Encounter for surgical aftercare following surgery on the digestive system: Secondary | ICD-10-CM | POA: Diagnosis not present

## 2021-03-22 DIAGNOSIS — D696 Thrombocytopenia, unspecified: Secondary | ICD-10-CM | POA: Diagnosis not present

## 2021-03-22 LAB — RENAL FUNCTION PANEL
Albumin: 4.2 g/dL (ref 3.7–4.7)
BUN/Creatinine Ratio: 17 (ref 12–28)
BUN: 21 mg/dL (ref 8–27)
CO2: 14 mmol/L — ABNORMAL LOW (ref 20–29)
Calcium: 10.6 mg/dL — ABNORMAL HIGH (ref 8.7–10.3)
Chloride: 99 mmol/L (ref 96–106)
Creatinine, Ser: 1.21 mg/dL — ABNORMAL HIGH (ref 0.57–1.00)
Glucose: 117 mg/dL — ABNORMAL HIGH (ref 65–99)
Phosphorus: 3.6 mg/dL (ref 3.0–4.3)
Potassium: 5.3 mmol/L — ABNORMAL HIGH (ref 3.5–5.2)
Sodium: 130 mmol/L — ABNORMAL LOW (ref 134–144)
eGFR: 47 mL/min/{1.73_m2} — ABNORMAL LOW (ref >59)

## 2021-03-22 LAB — MAGNESIUM: Magnesium: 1.4 mg/dL — ABNORMAL LOW (ref 1.6–2.3)

## 2021-03-24 DIAGNOSIS — D696 Thrombocytopenia, unspecified: Secondary | ICD-10-CM | POA: Diagnosis not present

## 2021-03-24 DIAGNOSIS — E119 Type 2 diabetes mellitus without complications: Secondary | ICD-10-CM | POA: Diagnosis not present

## 2021-03-24 DIAGNOSIS — D649 Anemia, unspecified: Secondary | ICD-10-CM | POA: Diagnosis not present

## 2021-03-24 DIAGNOSIS — K5730 Diverticulosis of large intestine without perforation or abscess without bleeding: Secondary | ICD-10-CM | POA: Diagnosis not present

## 2021-03-24 DIAGNOSIS — E785 Hyperlipidemia, unspecified: Secondary | ICD-10-CM | POA: Diagnosis not present

## 2021-03-24 DIAGNOSIS — K219 Gastro-esophageal reflux disease without esophagitis: Secondary | ICD-10-CM | POA: Diagnosis not present

## 2021-03-24 DIAGNOSIS — I10 Essential (primary) hypertension: Secondary | ICD-10-CM | POA: Diagnosis not present

## 2021-03-24 DIAGNOSIS — M1990 Unspecified osteoarthritis, unspecified site: Secondary | ICD-10-CM | POA: Diagnosis not present

## 2021-03-24 DIAGNOSIS — Z9049 Acquired absence of other specified parts of digestive tract: Secondary | ICD-10-CM | POA: Diagnosis not present

## 2021-03-24 DIAGNOSIS — Z432 Encounter for attention to ileostomy: Secondary | ICD-10-CM | POA: Diagnosis not present

## 2021-03-24 DIAGNOSIS — Z48815 Encounter for surgical aftercare following surgery on the digestive system: Secondary | ICD-10-CM | POA: Diagnosis not present

## 2021-03-25 ENCOUNTER — Other Ambulatory Visit: Payer: Self-pay

## 2021-03-25 ENCOUNTER — Ambulatory Visit (INDEPENDENT_AMBULATORY_CARE_PROVIDER_SITE_OTHER): Payer: Medicare Other | Admitting: *Deleted

## 2021-03-25 DIAGNOSIS — Z0000 Encounter for general adult medical examination without abnormal findings: Secondary | ICD-10-CM

## 2021-03-25 NOTE — Progress Notes (Signed)
Subjective:   Frances Harmon is a 75 y.o. female who presents for Medicare Annual (Subsequent) preventive examination.   I connected with  Frances Harmon on 03/25/21 by telephone enabled telemedicine application and verified that I am speaking with the correct person using two identifiers.   I discussed the limitations of evaluation and management by telemedicine. The patient expressed understanding and agreed to proceed.  Location of patient: Home Location of provider: Office     Objective:    Today's Vitals   03/25/21 1113  PainSc: 0-No pain   There is no height or weight on file to calculate BMI.  Advanced Directives 03/25/2021 07/27/2020 03/02/2020 03/02/2019 02/26/2018 01/21/2017 05/06/2015  Does Patient Have a Medical Advance Directive? Yes Yes Yes No Yes Yes Yes  Type of Advance Directive Living will;Healthcare Power of Attorney Living will [place];Living will - [place];Living will [place];Living will [place];Living will  Does patient want to make changes to medical advance directive? - - - - - - No - Patient declined  Copy of [place] in Chart? No - copy requested - No - copy requested - No - copy requested No - copy requested No - copy requested  Would patient like information on creating a medical advance directive? No - Patient declined - - Yes (MAU/Ambulatory/Procedural Areas - Information given) - - -    Current Medications (verified) Outpatient Encounter Medications as of 03/25/2021  Medication Sig   amLODipine (NORVASC) 5 MG tablet Take by mouth.   atorvastatin (LIPITOR) 20 MG tablet Take by mouth.   glipiZIDE (GLUCOTROL XL) 2.5 MG 24 hr tablet TAKE (1) TABLET BY MOUTH EVERY DAY AT THE SAME TIME EVERY MORNING   metFORMIN (GLUCOPHAGE) 500 MG tablet TAKE (1) TABLET BY MOUTH TWICE DAILY   omeprazole (PRILOSEC) 20 MG capsule TAKE (1) CAPSULE BY MOUTH EVERY MORNING AT  THE SAME TIME   cyclobenzaprine (FLEXERIL) 5 MG tablet Take 1 tablet (5 mg total) by mouth 3 (three) times daily as needed for muscle spasms. (Patient not taking: No sig reported)   triamcinolone (KENALOG) 0.1 % Apply 1 application topically 2 (two) times daily. (Patient not taking: Reported on 03/25/2021)   No facility-administered encounter medications on file as of 03/25/2021.    Allergies (verified) Patient has no known allergies.   History: Past Medical History:  Diagnosis Date   Diabetes mellitus without complication (HCC)    GERD (gastroesophageal reflux disease)    Hyperlipidemia    Hypertension    Past Surgical History:  Procedure Laterality Date   COLONOSCOPY  2009   repeat in 5 years- Dr Bary Leriche   COLONOSCOPY WITH PROPOFOL N/A 09/09/2015   Procedure: COLONOSCOPY WITH PROPOFOL;  Surgeon: Hulen Luster, MD;  Location[facility] ENDOSCOPY;  Service: Gastroenterology;  Laterality: N/A;   FOOT SURGERY Bilateral    Bunion removal   INCONTINENCE SURGERY     VAGINAL HYSTERECTOMY     VENTRAL HERNIA REPAIR N/A 05/06/2015   Procedure: HERNIA REPAIR VENTRAL ADULT;  Surgeon: Leonie Green, MD;  Location: [facility] ORS;  Service: General;  Laterality: N/A;   Family History  Problem Relation Age of Onset   Breast cancer Daughter 51   Breast cancer Mother 53   Hypertension Brother    Diabetes Brother    Arthritis Brother    Social History   Socioeconomic History   Marital status: Married    Spouse name: Not on  file   Number of children: 2   Years of education: Not on file   Highest education level: 12th grade  Occupational History   Occupation: Retired    Comment: works part time  Tobacco Use   Smoking status: Never   Smokeless tobacco: Never   Tobacco comments:    smokin cessation materials not required  Vaping Use   Vaping Use: Never used  Substance and Sexual Activity   Alcohol use: No    Alcohol/week: 0.0 standard drinks   Drug use: No   Sexual activity: Not Currently   Other Topics Concern   Not on file  Social History Narrative   Not on file   Social Determinants of Health   Financial Resource Strain: Low Risk    Difficulty of Paying Living Expenses: Not hard at all  Food Insecurity: No Food Insecurity   Worried About Charity fundraiser in the Last Year: Never true   [place] in the Last Year: Never true  Transportation Needs: No Transportation Needs   Lack of Transportation (Medical): No   Lack of Transportation (Non-Medical): No  Physical Activity: Sufficiently Active   Days of Exercise per Week: 7 days   Minutes of Exercise per Session: 40 min  Stress: No Stress Concern Present   Feeling of Stress : Only a little  Social Connections: Not on file    Tobacco Counseling Counseling given: Not Answered Tobacco comments: smokin cessation materials not required   Clinical Intake:  Pain : 0-10 Pain Score: 0-No pain  Nutritional Risks: None Diabetes: Yes CBG done?: No (pt have not took reading yet) Did pt. bring in CBG monitor from home?: No (telephone visit)  How often do you need to have someone help you when you read instructions, pamphlets, or other written materials from your doctor or pharmacy?: 1 - Never  Diabetic?Yes  Interpreter Needed?: No   Activities of Daily Living In your present state of health, do you have any difficulty performing the following activities: 03/25/2021 07/13/2020  Hearing? N N  Vision? N N  Comment do wear reading glasses -  Difficulty concentrating or making decisions? N N  Walking or climbing stairs? N N  Dressing or bathing? N N  Doing errands, shopping? N N  Preparing Food and eating ? N -  Using the Toilet? N -  In the past six months, have you accidently leaked urine? N -  Do you have problems with loss of bowel control? N -  Managing your Medications? N -  Managing your Finances? N -  Housekeeping or managing your Housekeeping? N -  Some recent data might be hidden    Patient  Care Team: Juline Patch, MD as PCP - General (Family Medicine)  Indicate any recent Medical Services you may have received from other than [facility] providers in the past year (date may be approximate).     Assessment:   This is a routine Medicare Wellness Visit for Frances Harmon.  Dietary issues and exercise activities discussed: Exercise limited by: Other - see comments  Depression Screen PHQ 2/9 Scores 03/25/2021 03/07/2021 10/25/2020 09/06/2020 05/02/2020 04/04/2020 03/02/2020  PHQ - 2 Score 0 0 0 0 0 0 0  PHQ- 9 Score 0 2 0 0 0 0 -    Fall Risk Fall Risk  03/25/2021 03/21/2021 03/07/2021 07/13/2020 05/02/2020  Falls in the past year? 0 0 0 0 0  Number falls in past yr: 0 0 0 0 -  Comment - - - - -  Injury with Fall? 0 0 0 0 -  Comment - - - - -  Risk for fall due to : Impaired balance/gait Impaired balance/gait Impaired balance/gait - -  Risk for fall due to: Comment - - - - -  Follow up Falls evaluation completed Falls evaluation completed Falls evaluation completed Falls evaluation completed Falls evaluation completed    [place]:  Any stairs in or around the home? No  If so, are there any without handrails?  N/A Home free of loose throw rugs in walkways, pet beds, electrical cords, etc? Yes  Adequate lighting in your home to reduce risk of falls? Yes   ASSISTIVE DEVICES UTILIZED TO PREVENT FALLS:  Life alert? No  Use of a cane, walker or w/c? No  Grab bars in the bathroom? No  Shower chair or bench in shower? No  Elevated toilet seat or a handicapped toilet? No   Cognitive Function:     6CIT Screen 03/25/2021 03/02/2020 03/02/2019 02/26/2018 01/21/2017  What Year? 0 points 0 points 0 points 0 points 0 points  What month? 0 points 0 points 0 points 0 points 0 points  What time? 0 points 0 points 0 points 0 points 0 points  Count back from 20 0 points 0 points 0 points 0 points 0 points  Months in reverse 0 points 0 points 2 points 0 points 0 points   Repeat phrase 0 points 0 points 0 points 0 points 0 points  Total Score 0 0 2 0 0    Immunizations Immunization History  Administered Date(s) Administered   Fluad Quad(high Dose 65+) 06/16/2019, 05/19/2020   Influenza, High Dose Seasonal PF 03/19/2017, 04/09/2018   Influenza,inj,Quad PF,6+ Mos 04/28/2015   Moderna Sars-Covid-2 Vaccination 09/07/2019, 09/28/2019   Pneumococcal Polysaccharide-23 04/10/2013    TDAP status: Up to date  Flu Vaccine status: Due, Education has been provided regarding the importance of this vaccine. Advised may receive this vaccine at local pharmacy or Health Dept. Aware to provide a copy of the vaccination record if obtained from local pharmacy or Health Dept. Verbalized acceptance and understanding.  Pneumococcal vaccine status: Up to date  Covid-19 vaccine status: Completed vaccines  Qualifies for Shingles Vaccine? Yes   Zostavax completed  2nd Vaccine due 06/21/2021   Shingrix Completed?: No.    Education has been provided regarding the importance of this vaccine. Patient has been advised to call insurance company to determine out of pocket expense if they have not yet received this vaccine. Advised may also receive vaccine at local pharmacy or Health Dept. Verbalized acceptance and understanding.  Screening Tests Health Maintenance  Topic Date Due   OPHTHALMOLOGY EXAM  09/20/2020   INFLUENZA VACCINE  02/20/2021   COVID-19 Vaccine (3 - Mixed Product risk series) 04/06/2021 (Originally 10/26/2019)   Zoster Vaccines- Shingrix (1 of 2) 06/21/2021 (Originally 09/25/1964)   MAMMOGRAM  03/21/2022 (Originally 01/10/2021)   URINE MICROALBUMIN  09/06/2021   HEMOGLOBIN A1C  09/07/2021   FOOT EXAM  09/15/2021   TETANUS/TDAP  12/05/2026   COLONOSCOPY (Pts 45-6yr Insurance coverage will need to be confirmed)  10/27/2029   DEXA SCAN  Completed   Hepatitis C Screening  Completed   PNA vac Low Risk Adult  Completed   HPV VACCINES  Aged Out    Health  Maintenance  Health Maintenance Due  Topic Date Due   OPHTHALMOLOGY EXAM  09/20/2020   INFLUENZA VACCINE  02/20/2021    Colorectal cancer screening: Type of  screening: Colonoscopy. Completed 10/30/2019. Repeat every 10/27/2029 years  Mammogram status: Completed 01/11/2020. Repeat every year.   Bone Density status: Completed 03/14/2020. Results reflect: Bone density results: OSTEOPENIA. Repeat every 2 years.  Lung Cancer Screening: (Low Dose CT Chest recommended if Age 27-80 years, 30 pack-year currently smoking OR have quit w/in 15years.) does qualify.   Additional Screening:  Hepatitis C Screening: does qualify; Completed 01/21/2017  Vision Screening: Recommended annual ophthalmology exams for early detection of glaucoma and other disorders of the eye. Is the patient up to date with their annual eye exam?  No, Patient was advised to call PCP office to get her DM annual eye exam.  Who is the provider or what is the name of the office in which the patient attends annual eye exams? Otilio Miu C,MD If pt is not established with a provider, would they like to be referred to a provider to establish care? No .   Dental Screening: Recommended annual dental exams for proper oral hygiene  Community Resource Referral / Chronic Care Management: CRR required this visit?  No   CCM required this visit?  No      Plan:     I have personally reviewed and noted the following in the patient's chart:   Medical and social history Use of alcohol, tobacco or illicit drugs  Current medications and supplements including opioid prescriptions.  Functional ability and status Nutritional status Physical activity Advanced directives List of other physicians Hospitalizations, surgeries, and ER visits in previous 12 months Vitals Screenings to include cognitive, depression, and falls Referrals and appointments  In addition, I have reviewed and discussed with patient certain preventive  protocols, quality metrics, and best practice recommendations. A written personalized care plan for preventive services as well as general preventive health recommendations were provided to patient.     Emilio Aspen [place], [place]   [id number]   Nurse Notes: Patient agreed to complete her Annual Medicare Wellness Visit.  No one assisted her with visit.  Patient demonstrated understanding during entire telephone visit.  Pt informed to call PCP office to schedule appt to catch up on her vaccines that are due and her DM eye exam.  Patient verbalized understanding

## 2021-03-25 NOTE — Patient Instructions (Signed)
Follow up with PCP to complete vaccines and annual medicare eye exam.

## 2021-03-27 DIAGNOSIS — D649 Anemia, unspecified: Secondary | ICD-10-CM | POA: Diagnosis not present

## 2021-03-27 DIAGNOSIS — I10 Essential (primary) hypertension: Secondary | ICD-10-CM | POA: Diagnosis not present

## 2021-03-27 DIAGNOSIS — Z9049 Acquired absence of other specified parts of digestive tract: Secondary | ICD-10-CM | POA: Diagnosis not present

## 2021-03-27 DIAGNOSIS — Z432 Encounter for attention to ileostomy: Secondary | ICD-10-CM | POA: Diagnosis not present

## 2021-03-27 DIAGNOSIS — M1990 Unspecified osteoarthritis, unspecified site: Secondary | ICD-10-CM | POA: Diagnosis not present

## 2021-03-27 DIAGNOSIS — E785 Hyperlipidemia, unspecified: Secondary | ICD-10-CM | POA: Diagnosis not present

## 2021-03-27 DIAGNOSIS — K5730 Diverticulosis of large intestine without perforation or abscess without bleeding: Secondary | ICD-10-CM | POA: Diagnosis not present

## 2021-03-27 DIAGNOSIS — E119 Type 2 diabetes mellitus without complications: Secondary | ICD-10-CM | POA: Diagnosis not present

## 2021-03-27 DIAGNOSIS — D696 Thrombocytopenia, unspecified: Secondary | ICD-10-CM | POA: Diagnosis not present

## 2021-03-27 DIAGNOSIS — Z48815 Encounter for surgical aftercare following surgery on the digestive system: Secondary | ICD-10-CM | POA: Diagnosis not present

## 2021-03-27 DIAGNOSIS — K219 Gastro-esophageal reflux disease without esophagitis: Secondary | ICD-10-CM | POA: Diagnosis not present

## 2021-03-30 ENCOUNTER — Other Ambulatory Visit: Payer: Self-pay

## 2021-03-30 DIAGNOSIS — Z9049 Acquired absence of other specified parts of digestive tract: Secondary | ICD-10-CM | POA: Diagnosis not present

## 2021-03-30 DIAGNOSIS — E875 Hyperkalemia: Secondary | ICD-10-CM | POA: Diagnosis not present

## 2021-03-30 DIAGNOSIS — D696 Thrombocytopenia, unspecified: Secondary | ICD-10-CM | POA: Diagnosis not present

## 2021-03-30 DIAGNOSIS — I10 Essential (primary) hypertension: Secondary | ICD-10-CM

## 2021-03-30 DIAGNOSIS — E785 Hyperlipidemia, unspecified: Secondary | ICD-10-CM | POA: Diagnosis not present

## 2021-03-30 DIAGNOSIS — E119 Type 2 diabetes mellitus without complications: Secondary | ICD-10-CM | POA: Diagnosis not present

## 2021-03-30 DIAGNOSIS — Z48815 Encounter for surgical aftercare following surgery on the digestive system: Secondary | ICD-10-CM | POA: Diagnosis not present

## 2021-03-30 DIAGNOSIS — K5730 Diverticulosis of large intestine without perforation or abscess without bleeding: Secondary | ICD-10-CM | POA: Diagnosis not present

## 2021-03-30 DIAGNOSIS — M1990 Unspecified osteoarthritis, unspecified site: Secondary | ICD-10-CM | POA: Diagnosis not present

## 2021-03-30 DIAGNOSIS — D649 Anemia, unspecified: Secondary | ICD-10-CM | POA: Diagnosis not present

## 2021-03-30 DIAGNOSIS — Z7984 Long term (current) use of oral hypoglycemic drugs: Secondary | ICD-10-CM | POA: Diagnosis not present

## 2021-03-30 DIAGNOSIS — K219 Gastro-esophageal reflux disease without esophagitis: Secondary | ICD-10-CM | POA: Diagnosis not present

## 2021-03-30 DIAGNOSIS — Z432 Encounter for attention to ileostomy: Secondary | ICD-10-CM | POA: Diagnosis not present

## 2021-03-31 LAB — RENAL FUNCTION PANEL
Albumin: 4.1 g/dL (ref 3.7–4.7)
BUN/Creatinine Ratio: 17 (ref 12–28)
BUN: 28 mg/dL — ABNORMAL HIGH (ref 8–27)
CO2: 14 mmol/L — ABNORMAL LOW (ref 20–29)
Calcium: 10.2 mg/dL (ref 8.7–10.3)
Chloride: 101 mmol/L (ref 96–106)
Creatinine, Ser: 1.61 mg/dL — ABNORMAL HIGH (ref 0.57–1.00)
Glucose: 118 mg/dL — ABNORMAL HIGH (ref 65–99)
Phosphorus: 4.1 mg/dL (ref 3.0–4.3)
Potassium: 4.9 mmol/L (ref 3.5–5.2)
Sodium: 130 mmol/L — ABNORMAL LOW (ref 134–144)
eGFR: 34 mL/min/{1.73_m2} — ABNORMAL LOW (ref >59)

## 2021-04-04 DIAGNOSIS — Z432 Encounter for attention to ileostomy: Secondary | ICD-10-CM | POA: Diagnosis not present

## 2021-04-04 DIAGNOSIS — M1990 Unspecified osteoarthritis, unspecified site: Secondary | ICD-10-CM | POA: Diagnosis not present

## 2021-04-04 DIAGNOSIS — I10 Essential (primary) hypertension: Secondary | ICD-10-CM | POA: Diagnosis not present

## 2021-04-04 DIAGNOSIS — E119 Type 2 diabetes mellitus without complications: Secondary | ICD-10-CM | POA: Diagnosis not present

## 2021-04-04 DIAGNOSIS — K219 Gastro-esophageal reflux disease without esophagitis: Secondary | ICD-10-CM | POA: Diagnosis not present

## 2021-04-04 DIAGNOSIS — D696 Thrombocytopenia, unspecified: Secondary | ICD-10-CM | POA: Diagnosis not present

## 2021-04-04 DIAGNOSIS — E785 Hyperlipidemia, unspecified: Secondary | ICD-10-CM | POA: Diagnosis not present

## 2021-04-04 DIAGNOSIS — Z9049 Acquired absence of other specified parts of digestive tract: Secondary | ICD-10-CM | POA: Diagnosis not present

## 2021-04-04 DIAGNOSIS — Z7984 Long term (current) use of oral hypoglycemic drugs: Secondary | ICD-10-CM | POA: Diagnosis not present

## 2021-04-04 DIAGNOSIS — Z48815 Encounter for surgical aftercare following surgery on the digestive system: Secondary | ICD-10-CM | POA: Diagnosis not present

## 2021-04-04 DIAGNOSIS — K5730 Diverticulosis of large intestine without perforation or abscess without bleeding: Secondary | ICD-10-CM | POA: Diagnosis not present

## 2021-04-04 DIAGNOSIS — D649 Anemia, unspecified: Secondary | ICD-10-CM | POA: Diagnosis not present

## 2021-04-06 ENCOUNTER — Other Ambulatory Visit: Payer: Self-pay

## 2021-04-06 DIAGNOSIS — K5730 Diverticulosis of large intestine without perforation or abscess without bleeding: Secondary | ICD-10-CM | POA: Diagnosis not present

## 2021-04-06 DIAGNOSIS — E785 Hyperlipidemia, unspecified: Secondary | ICD-10-CM | POA: Diagnosis not present

## 2021-04-06 DIAGNOSIS — Z48815 Encounter for surgical aftercare following surgery on the digestive system: Secondary | ICD-10-CM | POA: Diagnosis not present

## 2021-04-06 DIAGNOSIS — E875 Hyperkalemia: Secondary | ICD-10-CM

## 2021-04-06 DIAGNOSIS — Z432 Encounter for attention to ileostomy: Secondary | ICD-10-CM | POA: Diagnosis not present

## 2021-04-06 DIAGNOSIS — R7989 Other specified abnormal findings of blood chemistry: Secondary | ICD-10-CM

## 2021-04-06 DIAGNOSIS — E119 Type 2 diabetes mellitus without complications: Secondary | ICD-10-CM | POA: Diagnosis not present

## 2021-04-06 DIAGNOSIS — D649 Anemia, unspecified: Secondary | ICD-10-CM | POA: Diagnosis not present

## 2021-04-06 DIAGNOSIS — K219 Gastro-esophageal reflux disease without esophagitis: Secondary | ICD-10-CM | POA: Diagnosis not present

## 2021-04-06 DIAGNOSIS — I10 Essential (primary) hypertension: Secondary | ICD-10-CM | POA: Diagnosis not present

## 2021-04-06 DIAGNOSIS — Z9049 Acquired absence of other specified parts of digestive tract: Secondary | ICD-10-CM | POA: Diagnosis not present

## 2021-04-06 DIAGNOSIS — D696 Thrombocytopenia, unspecified: Secondary | ICD-10-CM | POA: Diagnosis not present

## 2021-04-06 DIAGNOSIS — M1990 Unspecified osteoarthritis, unspecified site: Secondary | ICD-10-CM | POA: Diagnosis not present

## 2021-04-06 DIAGNOSIS — Z7984 Long term (current) use of oral hypoglycemic drugs: Secondary | ICD-10-CM | POA: Diagnosis not present

## 2021-04-07 ENCOUNTER — Other Ambulatory Visit: Payer: Self-pay

## 2021-04-07 DIAGNOSIS — R7989 Other specified abnormal findings of blood chemistry: Secondary | ICD-10-CM

## 2021-04-07 LAB — RENAL FUNCTION PANEL
Albumin: 3.9 g/dL (ref 3.7–4.7)
BUN/Creatinine Ratio: 12 (ref 12–28)
BUN: 20 mg/dL (ref 8–27)
CO2: 14 mmol/L — ABNORMAL LOW (ref 20–29)
Calcium: 10.7 mg/dL — ABNORMAL HIGH (ref 8.7–10.3)
Chloride: 99 mmol/L (ref 96–106)
Creatinine, Ser: 1.62 mg/dL — ABNORMAL HIGH (ref 0.57–1.00)
Glucose: 117 mg/dL — ABNORMAL HIGH (ref 65–99)
Phosphorus: 3.9 mg/dL (ref 3.0–4.3)
Potassium: 5.1 mmol/L (ref 3.5–5.2)
Sodium: 129 mmol/L — ABNORMAL LOW (ref 134–144)
eGFR: 33 mL/min/{1.73_m2} — ABNORMAL LOW (ref >59)

## 2021-04-07 NOTE — Progress Notes (Signed)
Ref placed for neph

## 2021-04-10 DIAGNOSIS — Z432 Encounter for attention to ileostomy: Secondary | ICD-10-CM | POA: Diagnosis not present

## 2021-04-10 DIAGNOSIS — K5730 Diverticulosis of large intestine without perforation or abscess without bleeding: Secondary | ICD-10-CM | POA: Diagnosis not present

## 2021-04-10 DIAGNOSIS — Z7984 Long term (current) use of oral hypoglycemic drugs: Secondary | ICD-10-CM | POA: Diagnosis not present

## 2021-04-10 DIAGNOSIS — E119 Type 2 diabetes mellitus without complications: Secondary | ICD-10-CM | POA: Diagnosis not present

## 2021-04-10 DIAGNOSIS — Z9049 Acquired absence of other specified parts of digestive tract: Secondary | ICD-10-CM | POA: Diagnosis not present

## 2021-04-10 DIAGNOSIS — E785 Hyperlipidemia, unspecified: Secondary | ICD-10-CM | POA: Diagnosis not present

## 2021-04-10 DIAGNOSIS — D696 Thrombocytopenia, unspecified: Secondary | ICD-10-CM | POA: Diagnosis not present

## 2021-04-10 DIAGNOSIS — D649 Anemia, unspecified: Secondary | ICD-10-CM | POA: Diagnosis not present

## 2021-04-10 DIAGNOSIS — I10 Essential (primary) hypertension: Secondary | ICD-10-CM | POA: Diagnosis not present

## 2021-04-10 DIAGNOSIS — M1990 Unspecified osteoarthritis, unspecified site: Secondary | ICD-10-CM | POA: Diagnosis not present

## 2021-04-10 DIAGNOSIS — K219 Gastro-esophageal reflux disease without esophagitis: Secondary | ICD-10-CM | POA: Diagnosis not present

## 2021-04-10 DIAGNOSIS — Z48815 Encounter for surgical aftercare following surgery on the digestive system: Secondary | ICD-10-CM | POA: Diagnosis not present

## 2021-04-11 DIAGNOSIS — Z7984 Long term (current) use of oral hypoglycemic drugs: Secondary | ICD-10-CM | POA: Diagnosis not present

## 2021-04-11 DIAGNOSIS — Z9049 Acquired absence of other specified parts of digestive tract: Secondary | ICD-10-CM | POA: Diagnosis not present

## 2021-04-11 DIAGNOSIS — Z432 Encounter for attention to ileostomy: Secondary | ICD-10-CM | POA: Diagnosis not present

## 2021-04-11 DIAGNOSIS — M1990 Unspecified osteoarthritis, unspecified site: Secondary | ICD-10-CM | POA: Diagnosis not present

## 2021-04-11 DIAGNOSIS — E119 Type 2 diabetes mellitus without complications: Secondary | ICD-10-CM | POA: Diagnosis not present

## 2021-04-11 DIAGNOSIS — I10 Essential (primary) hypertension: Secondary | ICD-10-CM | POA: Diagnosis not present

## 2021-04-11 DIAGNOSIS — K5730 Diverticulosis of large intestine without perforation or abscess without bleeding: Secondary | ICD-10-CM | POA: Diagnosis not present

## 2021-04-11 DIAGNOSIS — Z48815 Encounter for surgical aftercare following surgery on the digestive system: Secondary | ICD-10-CM | POA: Diagnosis not present

## 2021-04-11 DIAGNOSIS — E785 Hyperlipidemia, unspecified: Secondary | ICD-10-CM | POA: Diagnosis not present

## 2021-04-11 DIAGNOSIS — D649 Anemia, unspecified: Secondary | ICD-10-CM | POA: Diagnosis not present

## 2021-04-11 DIAGNOSIS — D696 Thrombocytopenia, unspecified: Secondary | ICD-10-CM | POA: Diagnosis not present

## 2021-04-11 DIAGNOSIS — K219 Gastro-esophageal reflux disease without esophagitis: Secondary | ICD-10-CM | POA: Diagnosis not present

## 2021-04-19 DIAGNOSIS — I10 Essential (primary) hypertension: Secondary | ICD-10-CM | POA: Diagnosis not present

## 2021-04-19 DIAGNOSIS — Z432 Encounter for attention to ileostomy: Secondary | ICD-10-CM | POA: Diagnosis not present

## 2021-04-19 DIAGNOSIS — D649 Anemia, unspecified: Secondary | ICD-10-CM | POA: Diagnosis not present

## 2021-04-19 DIAGNOSIS — M1990 Unspecified osteoarthritis, unspecified site: Secondary | ICD-10-CM | POA: Diagnosis not present

## 2021-04-19 DIAGNOSIS — E119 Type 2 diabetes mellitus without complications: Secondary | ICD-10-CM | POA: Diagnosis not present

## 2021-04-19 DIAGNOSIS — Z7984 Long term (current) use of oral hypoglycemic drugs: Secondary | ICD-10-CM | POA: Diagnosis not present

## 2021-04-19 DIAGNOSIS — K219 Gastro-esophageal reflux disease without esophagitis: Secondary | ICD-10-CM | POA: Diagnosis not present

## 2021-04-19 DIAGNOSIS — Z48815 Encounter for surgical aftercare following surgery on the digestive system: Secondary | ICD-10-CM | POA: Diagnosis not present

## 2021-04-19 DIAGNOSIS — K5730 Diverticulosis of large intestine without perforation or abscess without bleeding: Secondary | ICD-10-CM | POA: Diagnosis not present

## 2021-04-19 DIAGNOSIS — D696 Thrombocytopenia, unspecified: Secondary | ICD-10-CM | POA: Diagnosis not present

## 2021-04-19 DIAGNOSIS — Z9049 Acquired absence of other specified parts of digestive tract: Secondary | ICD-10-CM | POA: Diagnosis not present

## 2021-04-19 DIAGNOSIS — E785 Hyperlipidemia, unspecified: Secondary | ICD-10-CM | POA: Diagnosis not present

## 2021-04-20 DIAGNOSIS — Z48815 Encounter for surgical aftercare following surgery on the digestive system: Secondary | ICD-10-CM | POA: Diagnosis not present

## 2021-04-20 DIAGNOSIS — K56609 Unspecified intestinal obstruction, unspecified as to partial versus complete obstruction: Secondary | ICD-10-CM | POA: Diagnosis not present

## 2021-04-24 DIAGNOSIS — Z932 Ileostomy status: Secondary | ICD-10-CM | POA: Diagnosis not present

## 2021-04-24 DIAGNOSIS — Z48815 Encounter for surgical aftercare following surgery on the digestive system: Secondary | ICD-10-CM | POA: Diagnosis not present

## 2021-04-25 DIAGNOSIS — I10 Essential (primary) hypertension: Secondary | ICD-10-CM | POA: Diagnosis not present

## 2021-04-25 DIAGNOSIS — D649 Anemia, unspecified: Secondary | ICD-10-CM | POA: Diagnosis not present

## 2021-04-25 DIAGNOSIS — K219 Gastro-esophageal reflux disease without esophagitis: Secondary | ICD-10-CM | POA: Diagnosis not present

## 2021-04-25 DIAGNOSIS — Z48815 Encounter for surgical aftercare following surgery on the digestive system: Secondary | ICD-10-CM | POA: Diagnosis not present

## 2021-04-25 DIAGNOSIS — D696 Thrombocytopenia, unspecified: Secondary | ICD-10-CM | POA: Diagnosis not present

## 2021-04-25 DIAGNOSIS — K5730 Diverticulosis of large intestine without perforation or abscess without bleeding: Secondary | ICD-10-CM | POA: Diagnosis not present

## 2021-04-25 DIAGNOSIS — Z7984 Long term (current) use of oral hypoglycemic drugs: Secondary | ICD-10-CM | POA: Diagnosis not present

## 2021-04-25 DIAGNOSIS — E785 Hyperlipidemia, unspecified: Secondary | ICD-10-CM | POA: Diagnosis not present

## 2021-04-25 DIAGNOSIS — Z9049 Acquired absence of other specified parts of digestive tract: Secondary | ICD-10-CM | POA: Diagnosis not present

## 2021-04-25 DIAGNOSIS — Z432 Encounter for attention to ileostomy: Secondary | ICD-10-CM | POA: Diagnosis not present

## 2021-04-25 DIAGNOSIS — M1990 Unspecified osteoarthritis, unspecified site: Secondary | ICD-10-CM | POA: Diagnosis not present

## 2021-04-25 DIAGNOSIS — E119 Type 2 diabetes mellitus without complications: Secondary | ICD-10-CM | POA: Diagnosis not present

## 2021-04-27 DIAGNOSIS — D649 Anemia, unspecified: Secondary | ICD-10-CM | POA: Diagnosis not present

## 2021-04-27 DIAGNOSIS — E118 Type 2 diabetes mellitus with unspecified complications: Secondary | ICD-10-CM | POA: Diagnosis not present

## 2021-04-27 DIAGNOSIS — R2681 Unsteadiness on feet: Secondary | ICD-10-CM | POA: Diagnosis not present

## 2021-04-27 DIAGNOSIS — K219 Gastro-esophageal reflux disease without esophagitis: Secondary | ICD-10-CM | POA: Diagnosis not present

## 2021-04-27 DIAGNOSIS — Z932 Ileostomy status: Secondary | ICD-10-CM | POA: Diagnosis not present

## 2021-04-27 DIAGNOSIS — Z9189 Other specified personal risk factors, not elsewhere classified: Secondary | ICD-10-CM | POA: Diagnosis not present

## 2021-04-27 DIAGNOSIS — Z01818 Encounter for other preprocedural examination: Secondary | ICD-10-CM | POA: Diagnosis not present

## 2021-04-27 DIAGNOSIS — I10 Essential (primary) hypertension: Secondary | ICD-10-CM | POA: Diagnosis not present

## 2021-04-27 DIAGNOSIS — E785 Hyperlipidemia, unspecified: Secondary | ICD-10-CM | POA: Diagnosis not present

## 2021-05-01 ENCOUNTER — Telehealth: Payer: Self-pay

## 2021-05-01 NOTE — Telephone Encounter (Signed)
Patient said she will set up the appointment after she and her husband do a covid test. She also mention that its not covid and don't understand why she has to get tested and its the same thing she develops every year.

## 2021-05-01 NOTE — Telephone Encounter (Signed)
Copied from [place] [id number]. Topic: Appointment Scheduling - Scheduling Inquiry for Clinic >> May 01, 2021  8:46 AM Scherrie Gerlach wrote: Reason for CRM: husband calling to ask Baxter Flattery to give him a call.  He states pt needs a clearance letter and he wants to know if Dr Ronnald Ramp actually needs to see her.  He states they both have sinus issues as well.  He states it has been going on over 2 weeks. He states pt has an appt with her kidney dr tomorrow, right there where you are, and he is hoping they can be worked in for appt.  She needs to be over this "cold" before they will do her reversal surgery. And she needs the clearance for the surgery.

## 2021-05-02 ENCOUNTER — Other Ambulatory Visit: Payer: Self-pay | Admitting: Nephrology

## 2021-05-02 ENCOUNTER — Other Ambulatory Visit (HOSPITAL_BASED_OUTPATIENT_CLINIC_OR_DEPARTMENT_OTHER): Payer: Self-pay | Admitting: Nephrology

## 2021-05-02 DIAGNOSIS — E1122 Type 2 diabetes mellitus with diabetic chronic kidney disease: Secondary | ICD-10-CM

## 2021-05-02 DIAGNOSIS — N1831 Chronic kidney disease, stage 3a: Secondary | ICD-10-CM

## 2021-05-02 DIAGNOSIS — N179 Acute kidney failure, unspecified: Secondary | ICD-10-CM | POA: Diagnosis not present

## 2021-05-02 DIAGNOSIS — I10 Essential (primary) hypertension: Secondary | ICD-10-CM | POA: Diagnosis not present

## 2021-05-02 NOTE — Telephone Encounter (Signed)
Pt requested to speak with Frances Harmon, stated she tested negative for everything, please advise.

## 2021-05-03 ENCOUNTER — Ambulatory Visit (INDEPENDENT_AMBULATORY_CARE_PROVIDER_SITE_OTHER): Payer: Medicare Other | Admitting: Family Medicine

## 2021-05-03 ENCOUNTER — Encounter: Payer: Self-pay | Admitting: Family Medicine

## 2021-05-03 ENCOUNTER — Other Ambulatory Visit: Payer: Self-pay

## 2021-05-03 VITALS — BP 112/66 | HR 90 | Ht 68.0 in | Wt 149.0 lb

## 2021-05-03 DIAGNOSIS — Z7984 Long term (current) use of oral hypoglycemic drugs: Secondary | ICD-10-CM | POA: Diagnosis not present

## 2021-05-03 DIAGNOSIS — N1831 Chronic kidney disease, stage 3a: Secondary | ICD-10-CM | POA: Diagnosis not present

## 2021-05-03 DIAGNOSIS — Z23 Encounter for immunization: Secondary | ICD-10-CM

## 2021-05-03 DIAGNOSIS — E1122 Type 2 diabetes mellitus with diabetic chronic kidney disease: Secondary | ICD-10-CM | POA: Diagnosis not present

## 2021-05-03 DIAGNOSIS — K219 Gastro-esophageal reflux disease without esophagitis: Secondary | ICD-10-CM | POA: Diagnosis not present

## 2021-05-03 DIAGNOSIS — I10 Essential (primary) hypertension: Secondary | ICD-10-CM | POA: Diagnosis not present

## 2021-05-03 DIAGNOSIS — E785 Hyperlipidemia, unspecified: Secondary | ICD-10-CM | POA: Diagnosis not present

## 2021-05-03 DIAGNOSIS — Z432 Encounter for attention to ileostomy: Secondary | ICD-10-CM | POA: Diagnosis not present

## 2021-05-03 DIAGNOSIS — Z48815 Encounter for surgical aftercare following surgery on the digestive system: Secondary | ICD-10-CM | POA: Diagnosis not present

## 2021-05-03 DIAGNOSIS — M1990 Unspecified osteoarthritis, unspecified site: Secondary | ICD-10-CM | POA: Diagnosis not present

## 2021-05-03 DIAGNOSIS — N179 Acute kidney failure, unspecified: Secondary | ICD-10-CM | POA: Diagnosis not present

## 2021-05-03 DIAGNOSIS — J0100 Acute maxillary sinusitis, unspecified: Secondary | ICD-10-CM | POA: Diagnosis not present

## 2021-05-03 DIAGNOSIS — E119 Type 2 diabetes mellitus without complications: Secondary | ICD-10-CM | POA: Diagnosis not present

## 2021-05-03 DIAGNOSIS — Z9049 Acquired absence of other specified parts of digestive tract: Secondary | ICD-10-CM | POA: Diagnosis not present

## 2021-05-03 DIAGNOSIS — D649 Anemia, unspecified: Secondary | ICD-10-CM | POA: Diagnosis not present

## 2021-05-03 DIAGNOSIS — Z01818 Encounter for other preprocedural examination: Secondary | ICD-10-CM

## 2021-05-03 DIAGNOSIS — D696 Thrombocytopenia, unspecified: Secondary | ICD-10-CM | POA: Diagnosis not present

## 2021-05-03 DIAGNOSIS — K5730 Diverticulosis of large intestine without perforation or abscess without bleeding: Secondary | ICD-10-CM | POA: Diagnosis not present

## 2021-05-03 MED ORDER — AZITHROMYCIN 250 MG PO TABS: ORAL_TABLET | ORAL | 0 refills | Status: AC

## 2021-05-03 NOTE — Progress Notes (Signed)
Date:  05/03/2021   Name:  Frances Harmon   DOB:  [birth year]   MRN:  [id number]   Chief Complaint: Sinusitis (Cough, cong, sneezing- clear production. No fever- symptoms started x 1 week) and surgery clearance (Needs clearance on surgery)  Patient is a 75 year old female who presents for a preoperative clearance exam. The patient reports the following problems: sinus infection. Health maintenance has been reviewed up to date.    Sinusitis This is a new problem. The current episode started in the past 7 days. The problem is unchanged. There has been no fever. She is experiencing no pain. Associated symptoms include congestion, coughing, sinus pressure and sneezing. Pertinent negatives include no chills, diaphoresis, ear pain, headaches (sinus infection), hoarse voice, neck pain, shortness of breath, sore throat or swollen glands. Treatments tried: zyrtec.   Lab Results  Component Value Date   CREATININE 1.62 (H) 04/06/2021   BUN 20 04/06/2021   NA 129 (L) 04/06/2021   K 5.1 04/06/2021   CL 99 04/06/2021   CO2 14 (L) 04/06/2021   Lab Results  Component Value Date   CHOL 143 09/06/2020   HDL 51 09/06/2020   LDLCALC 72 09/06/2020   TRIG 109 09/06/2020   CHOLHDL 2.4 09/12/2017   No results found for: TSH Lab Results  Component Value Date   HGBA1C 5.9 (H) 03/07/2021   Lab Results  Component Value Date   WBC 5.9 07/27/2020   HGB 12.8 07/27/2020   HCT 38.9 07/27/2020   MCV 88.0 07/27/2020   PLT 162 07/27/2020   Lab Results  Component Value Date   ALT 11 10/22/2019   AST 13 10/22/2019   ALKPHOS 90 10/22/2019   BILITOT 0.5 10/22/2019     Review of Systems  Constitutional:  Negative for chills and diaphoresis.  HENT:  Positive for congestion, sinus pressure and sneezing. Negative for ear pain, hoarse voice and sore throat.   Respiratory:  Positive for cough. Negative for chest tightness, shortness of breath and wheezing.   Cardiovascular:  Negative for chest pain,  palpitations and leg swelling.  Gastrointestinal:  Negative for abdominal pain.  Musculoskeletal:  Negative for back pain and neck pain.  Neurological:  Negative for headaches (sinus infection).   Patient Active Problem List   Diagnosis Date Noted   H/O total hysterectomy with bilateral salpingo-oophorectomy (BSO) 09/06/2020   Thrombocytopenia ([place]) 04/23/2019   Type 2 diabetes mellitus with complication, without long-term current use of insulin ([place]) 04/28/2015   Essential hypertension 04/28/2015   Hyperlipidemia 04/28/2015   Esophageal reflux 04/28/2015   Vitamin D deficiency 04/28/2015    No Known Allergies  Past Surgical History:  Procedure Laterality Date   COLONOSCOPY  2009   repeat in 5 years- Dr Bary Leriche   COLONOSCOPY WITH PROPOFOL N/A 09/09/2015   Procedure: COLONOSCOPY WITH PROPOFOL;  Surgeon: Hulen Luster, MD;  Location: [facility] ENDOSCOPY;  Service: Gastroenterology;  Laterality: N/A;   FOOT SURGERY Bilateral    Bunion removal   INCONTINENCE SURGERY     VAGINAL HYSTERECTOMY     VENTRAL HERNIA REPAIR N/A 05/06/2015   Procedure: HERNIA REPAIR VENTRAL ADULT;  Surgeon: Leonie Green, MD;  Location: [facility] ORS;  Service: General;  Laterality: N/A;    Social History   Tobacco Use   Smoking status: Never   Smokeless tobacco: Never   Tobacco comments:    smokin cessation materials not required  Vaping Use   Vaping Use: Never used  Substance Use Topics  Alcohol use: No    Alcohol/week: 0.0 standard drinks   Drug use: No     Medication list has been reviewed and updated.  No outpatient medications have been marked as taking for the 05/03/21 encounter (Office Visit) with Juline Patch, MD.    [facility] 2/9 Scores 05/03/2021 03/25/2021 03/07/2021 10/25/2020  PHQ - 2 Score 0 0 0 0  PHQ- 9 Score 0 0 2 0    GAD 7 : Generalized Anxiety Score 05/03/2021 03/25/2021 03/07/2021 10/25/2020  Nervous, Anxious, on Edge 0 0 0 0  Control/stop worrying 0 0 0 0  Worry too much - different  things 0 0 0 0  Trouble relaxing 0 0 0 0  Restless 0 0 0 0  Easily annoyed or irritable 0 0 0 0  Afraid - awful might happen 0 0 0 0  Total GAD 7 Score 0 0 0 0  Anxiety Difficulty - Not difficult at all - -    BP Readings from Last 3 Encounters:  05/03/21 112/66  03/21/21 110/80  03/07/21 120/70    Physical Exam Constitutional:      Appearance: She is well-developed.  HENT:     Head: Normocephalic.     Right Ear: Tympanic membrane, ear canal and external ear normal.     Left Ear: Tympanic membrane, ear canal and external ear normal.     Nose: Nose normal. No congestion.     Mouth/Throat:     Mouth: Mucous membranes are moist.     Pharynx: Oropharynx is clear. No oropharyngeal exudate or posterior oropharyngeal erythema.  Eyes:     General: Lids are everted, no foreign bodies appreciated. No scleral icterus.       Left eye: No foreign body or hordeolum.     Conjunctiva/sclera: Conjunctivae normal.     Right eye: Right conjunctiva is not injected.     Left eye: Left conjunctiva is not injected.     Pupils: Pupils are equal, round, and reactive to light.  Neck:     Thyroid: No thyromegaly.     Vascular: No JVD.     Trachea: No tracheal deviation.  Cardiovascular:     Rate and Rhythm: Normal rate and regular rhythm.     Pulses: Normal pulses.     Heart sounds: Normal heart sounds, S1 normal and S2 normal. No murmur heard. No systolic murmur is present.  No diastolic murmur is present.    No friction rub. No gallop. No S3 or S4 sounds.  Pulmonary:     Effort: Pulmonary effort is normal. No respiratory distress.     Breath sounds: Normal breath sounds. No decreased breath sounds, wheezing, rhonchi or rales.  Abdominal:     General: Bowel sounds are normal.     Palpations: Abdomen is soft. There is no hepatomegaly, splenomegaly or mass.     Tenderness: There is no abdominal tenderness. There is no guarding or rebound.  Musculoskeletal:        General: No tenderness.  Normal range of motion.     Cervical back: Normal range of motion and neck supple.     Right lower leg: No edema.     Left lower leg: No edema.  Lymphadenopathy:     Cervical: No cervical adenopathy.  Skin:    General: Skin is warm.     Findings: No rash.  Neurological:     Mental Status: She is alert and oriented to person, place, and time.     Cranial  Nerves: No cranial nerve deficit.     Deep Tendon Reflexes: Reflexes normal.  Psychiatric:        Mood and Affect: Mood is not anxious or depressed.    Wt Readings from Last 3 Encounters:  05/03/21 149 lb (67.6 kg)  03/21/21 158 lb (71.7 kg)  03/07/21 159 lb (72.1 kg)    BP 112/66   Pulse 90   Ht 5\' 8"  (1.727 m)   Wt 149 lb (67.6 kg)   BMI 22.66 kg/m   Assessment and Plan:  1. Acute maxillary sinusitis, recurrence not specified Acute.  Episodic.  Patient has upcoming surgery and this is in early stages and we will go ahead and treat with azithromycin to 50 mg 2 today followed by 1 a day for 4 days. - azithromycin (ZITHROMAX) 250 MG tablet; Take 2 tablets on day 1, then 1 tablet daily on days 2 through 5  Dispense: 6 tablet; Refill: 0  2. Preoperative clearance Physical exam was done today with reference to upcoming surgery.  All systems were evaluated including cardiovascular pulmonary and abdominal exam.  Other than the upper respiratory infection patient is doing well with no concerns or contraindications to upcoming surgery.  3.  Influenza vaccine needed: Discussed and administered

## 2021-05-10 DIAGNOSIS — Z7984 Long term (current) use of oral hypoglycemic drugs: Secondary | ICD-10-CM | POA: Diagnosis not present

## 2021-05-10 DIAGNOSIS — K219 Gastro-esophageal reflux disease without esophagitis: Secondary | ICD-10-CM | POA: Diagnosis not present

## 2021-05-10 DIAGNOSIS — D649 Anemia, unspecified: Secondary | ICD-10-CM | POA: Diagnosis not present

## 2021-05-10 DIAGNOSIS — M1990 Unspecified osteoarthritis, unspecified site: Secondary | ICD-10-CM | POA: Diagnosis not present

## 2021-05-10 DIAGNOSIS — D696 Thrombocytopenia, unspecified: Secondary | ICD-10-CM | POA: Diagnosis not present

## 2021-05-10 DIAGNOSIS — I10 Essential (primary) hypertension: Secondary | ICD-10-CM | POA: Diagnosis not present

## 2021-05-10 DIAGNOSIS — E785 Hyperlipidemia, unspecified: Secondary | ICD-10-CM | POA: Diagnosis not present

## 2021-05-10 DIAGNOSIS — Z432 Encounter for attention to ileostomy: Secondary | ICD-10-CM | POA: Diagnosis not present

## 2021-05-10 DIAGNOSIS — Z48815 Encounter for surgical aftercare following surgery on the digestive system: Secondary | ICD-10-CM | POA: Diagnosis not present

## 2021-05-10 DIAGNOSIS — E119 Type 2 diabetes mellitus without complications: Secondary | ICD-10-CM | POA: Diagnosis not present

## 2021-05-10 DIAGNOSIS — K5730 Diverticulosis of large intestine without perforation or abscess without bleeding: Secondary | ICD-10-CM | POA: Diagnosis not present

## 2021-05-10 DIAGNOSIS — Z9049 Acquired absence of other specified parts of digestive tract: Secondary | ICD-10-CM | POA: Diagnosis not present

## 2021-05-13 DIAGNOSIS — Z20822 Contact with and (suspected) exposure to covid-19: Secondary | ICD-10-CM | POA: Diagnosis not present

## 2021-05-13 DIAGNOSIS — Z932 Ileostomy status: Secondary | ICD-10-CM | POA: Diagnosis not present

## 2021-05-16 DIAGNOSIS — Z7984 Long term (current) use of oral hypoglycemic drugs: Secondary | ICD-10-CM | POA: Diagnosis not present

## 2021-05-16 DIAGNOSIS — Z932 Ileostomy status: Secondary | ICD-10-CM | POA: Diagnosis not present

## 2021-05-16 DIAGNOSIS — E785 Hyperlipidemia, unspecified: Secondary | ICD-10-CM | POA: Diagnosis not present

## 2021-05-16 DIAGNOSIS — N189 Chronic kidney disease, unspecified: Secondary | ICD-10-CM | POA: Diagnosis not present

## 2021-05-16 DIAGNOSIS — I129 Hypertensive chronic kidney disease with stage 1 through stage 4 chronic kidney disease, or unspecified chronic kidney disease: Secondary | ICD-10-CM | POA: Diagnosis not present

## 2021-05-16 DIAGNOSIS — R110 Nausea: Secondary | ICD-10-CM | POA: Diagnosis not present

## 2021-05-16 DIAGNOSIS — Z8719 Personal history of other diseases of the digestive system: Secondary | ICD-10-CM | POA: Diagnosis not present

## 2021-05-16 DIAGNOSIS — N179 Acute kidney failure, unspecified: Secondary | ICD-10-CM | POA: Diagnosis not present

## 2021-05-16 DIAGNOSIS — K632 Fistula of intestine: Secondary | ICD-10-CM | POA: Diagnosis not present

## 2021-05-16 DIAGNOSIS — K219 Gastro-esophageal reflux disease without esophagitis: Secondary | ICD-10-CM | POA: Diagnosis not present

## 2021-05-16 DIAGNOSIS — N1832 Chronic kidney disease, stage 3b: Secondary | ICD-10-CM | POA: Diagnosis not present

## 2021-05-16 DIAGNOSIS — Z9049 Acquired absence of other specified parts of digestive tract: Secondary | ICD-10-CM | POA: Diagnosis not present

## 2021-05-16 DIAGNOSIS — E1122 Type 2 diabetes mellitus with diabetic chronic kidney disease: Secondary | ICD-10-CM | POA: Diagnosis not present

## 2021-05-16 DIAGNOSIS — Z432 Encounter for attention to ileostomy: Secondary | ICD-10-CM | POA: Diagnosis not present

## 2021-05-18 DIAGNOSIS — N179 Acute kidney failure, unspecified: Secondary | ICD-10-CM | POA: Insufficient documentation

## 2021-05-18 DIAGNOSIS — N189 Chronic kidney disease, unspecified: Secondary | ICD-10-CM | POA: Diagnosis not present

## 2021-05-22 ENCOUNTER — Telehealth: Payer: Self-pay

## 2021-05-22 NOTE — Telephone Encounter (Signed)
Copied from [place] [id number]. Topic: General - Inquiry >> May 22, 2021 11:39 AM Greggory Keen D wrote: Reason for CRM: Pt's husband called saying his wife had her reversal surgery last week and her surgeon wants her to have a renal panel done through her primary care.  He want to know if they should come by and pick up an order.  CB# [phone]

## 2021-05-23 ENCOUNTER — Telehealth: Payer: Self-pay

## 2021-05-23 DIAGNOSIS — I129 Hypertensive chronic kidney disease with stage 1 through stage 4 chronic kidney disease, or unspecified chronic kidney disease: Secondary | ICD-10-CM | POA: Diagnosis not present

## 2021-05-23 DIAGNOSIS — Z7984 Long term (current) use of oral hypoglycemic drugs: Secondary | ICD-10-CM | POA: Diagnosis not present

## 2021-05-23 DIAGNOSIS — D696 Thrombocytopenia, unspecified: Secondary | ICD-10-CM | POA: Diagnosis not present

## 2021-05-23 DIAGNOSIS — K5730 Diverticulosis of large intestine without perforation or abscess without bleeding: Secondary | ICD-10-CM | POA: Diagnosis not present

## 2021-05-23 DIAGNOSIS — D631 Anemia in chronic kidney disease: Secondary | ICD-10-CM | POA: Diagnosis not present

## 2021-05-23 DIAGNOSIS — Z9049 Acquired absence of other specified parts of digestive tract: Secondary | ICD-10-CM | POA: Diagnosis not present

## 2021-05-23 DIAGNOSIS — K219 Gastro-esophageal reflux disease without esophagitis: Secondary | ICD-10-CM | POA: Diagnosis not present

## 2021-05-23 DIAGNOSIS — E785 Hyperlipidemia, unspecified: Secondary | ICD-10-CM | POA: Diagnosis not present

## 2021-05-23 DIAGNOSIS — E1122 Type 2 diabetes mellitus with diabetic chronic kidney disease: Secondary | ICD-10-CM | POA: Diagnosis not present

## 2021-05-23 DIAGNOSIS — M1990 Unspecified osteoarthritis, unspecified site: Secondary | ICD-10-CM | POA: Diagnosis not present

## 2021-05-23 DIAGNOSIS — Z48815 Encounter for surgical aftercare following surgery on the digestive system: Secondary | ICD-10-CM | POA: Diagnosis not present

## 2021-05-23 DIAGNOSIS — N189 Chronic kidney disease, unspecified: Secondary | ICD-10-CM | POA: Diagnosis not present

## 2021-05-23 DIAGNOSIS — N179 Acute kidney failure, unspecified: Secondary | ICD-10-CM | POA: Diagnosis not present

## 2021-05-23 NOTE — Telephone Encounter (Signed)
Copied from [place] [id number]. Topic: General - Other >> May 23, 2021  1:02 PM Tessa Lerner A wrote: Reason for CRM: The patient's husband would like to be contacted by Baxter Flattery when available (before 2:15 if possible)  The patient's husband has additional concerns about the patient's potassium levels as well as labwork   Please contact further when possible

## 2021-05-23 NOTE — Telephone Encounter (Signed)
Called pt after receiving call from husband asking for her kidney panel to be checked s/p surgery. I explained to the pt. I saw where Frances Harmon reached out to her on 05/19/21 and "left message" for a return call. I have advised her to call her back, as the surgeon may have reached out to her to recheck the renal function since she is seeing them for kidneys. Pt voiced understanding

## 2021-05-23 NOTE — Telephone Encounter (Signed)
I put a call in to [facility] nurse. I need to see if they want [place] to redraw the renal function before November 14th per husband's request

## 2021-05-25 ENCOUNTER — Telehealth: Payer: Self-pay

## 2021-05-25 NOTE — Telephone Encounter (Signed)
Called and told pt I spoke to Ewen at Dr Elwyn Lade office. They will draw her labs at visit

## 2021-05-29 DIAGNOSIS — S31115A Laceration without foreign body of abdominal wall, periumbilic region without penetration into peritoneal cavity, initial encounter: Secondary | ICD-10-CM | POA: Diagnosis not present

## 2021-05-29 DIAGNOSIS — Z932 Ileostomy status: Secondary | ICD-10-CM | POA: Diagnosis not present

## 2021-05-30 DIAGNOSIS — E1122 Type 2 diabetes mellitus with diabetic chronic kidney disease: Secondary | ICD-10-CM | POA: Diagnosis not present

## 2021-05-30 DIAGNOSIS — N189 Chronic kidney disease, unspecified: Secondary | ICD-10-CM | POA: Diagnosis not present

## 2021-05-30 DIAGNOSIS — K5730 Diverticulosis of large intestine without perforation or abscess without bleeding: Secondary | ICD-10-CM | POA: Diagnosis not present

## 2021-05-30 DIAGNOSIS — D631 Anemia in chronic kidney disease: Secondary | ICD-10-CM | POA: Diagnosis not present

## 2021-05-30 DIAGNOSIS — K219 Gastro-esophageal reflux disease without esophagitis: Secondary | ICD-10-CM | POA: Diagnosis not present

## 2021-05-30 DIAGNOSIS — Z7984 Long term (current) use of oral hypoglycemic drugs: Secondary | ICD-10-CM | POA: Diagnosis not present

## 2021-05-30 DIAGNOSIS — N179 Acute kidney failure, unspecified: Secondary | ICD-10-CM | POA: Diagnosis not present

## 2021-05-30 DIAGNOSIS — D696 Thrombocytopenia, unspecified: Secondary | ICD-10-CM | POA: Diagnosis not present

## 2021-05-30 DIAGNOSIS — M1990 Unspecified osteoarthritis, unspecified site: Secondary | ICD-10-CM | POA: Diagnosis not present

## 2021-05-30 DIAGNOSIS — Z48815 Encounter for surgical aftercare following surgery on the digestive system: Secondary | ICD-10-CM | POA: Diagnosis not present

## 2021-05-30 DIAGNOSIS — Z9049 Acquired absence of other specified parts of digestive tract: Secondary | ICD-10-CM | POA: Diagnosis not present

## 2021-05-30 DIAGNOSIS — E785 Hyperlipidemia, unspecified: Secondary | ICD-10-CM | POA: Diagnosis not present

## 2021-05-30 DIAGNOSIS — I129 Hypertensive chronic kidney disease with stage 1 through stage 4 chronic kidney disease, or unspecified chronic kidney disease: Secondary | ICD-10-CM | POA: Diagnosis not present

## 2021-05-31 DIAGNOSIS — Z09 Encounter for follow-up examination after completed treatment for conditions other than malignant neoplasm: Secondary | ICD-10-CM | POA: Diagnosis not present

## 2021-05-31 DIAGNOSIS — Z9889 Other specified postprocedural states: Secondary | ICD-10-CM | POA: Diagnosis not present

## 2021-06-05 ENCOUNTER — Ambulatory Visit: Payer: Medicare Other | Attending: Nephrology

## 2021-06-05 DIAGNOSIS — Z7984 Long term (current) use of oral hypoglycemic drugs: Secondary | ICD-10-CM | POA: Diagnosis not present

## 2021-06-05 DIAGNOSIS — L24A9 Irritant contact dermatitis due friction or contact with other specified body fluids: Secondary | ICD-10-CM | POA: Diagnosis not present

## 2021-06-05 DIAGNOSIS — N179 Acute kidney failure, unspecified: Secondary | ICD-10-CM | POA: Diagnosis not present

## 2021-06-05 DIAGNOSIS — Z20822 Contact with and (suspected) exposure to covid-19: Secondary | ICD-10-CM | POA: Diagnosis not present

## 2021-06-05 DIAGNOSIS — Z8719 Personal history of other diseases of the digestive system: Secondary | ICD-10-CM | POA: Diagnosis not present

## 2021-06-05 DIAGNOSIS — Z932 Ileostomy status: Secondary | ICD-10-CM | POA: Diagnosis not present

## 2021-06-05 DIAGNOSIS — I129 Hypertensive chronic kidney disease with stage 1 through stage 4 chronic kidney disease, or unspecified chronic kidney disease: Secondary | ICD-10-CM | POA: Diagnosis not present

## 2021-06-05 DIAGNOSIS — N1831 Chronic kidney disease, stage 3a: Secondary | ICD-10-CM | POA: Diagnosis not present

## 2021-06-05 DIAGNOSIS — R600 Localized edema: Secondary | ICD-10-CM | POA: Diagnosis not present

## 2021-06-05 DIAGNOSIS — E1122 Type 2 diabetes mellitus with diabetic chronic kidney disease: Secondary | ICD-10-CM | POA: Diagnosis not present

## 2021-06-05 DIAGNOSIS — K6389 Other specified diseases of intestine: Secondary | ICD-10-CM | POA: Diagnosis not present

## 2021-06-05 DIAGNOSIS — I10 Essential (primary) hypertension: Secondary | ICD-10-CM | POA: Diagnosis not present

## 2021-06-05 DIAGNOSIS — N189 Chronic kidney disease, unspecified: Secondary | ICD-10-CM | POA: Diagnosis not present

## 2021-06-05 DIAGNOSIS — T8189XA Other complications of procedures, not elsewhere classified, initial encounter: Secondary | ICD-10-CM | POA: Diagnosis not present

## 2021-06-05 DIAGNOSIS — L7682 Other postprocedural complications of skin and subcutaneous tissue: Secondary | ICD-10-CM | POA: Diagnosis not present

## 2021-06-06 ENCOUNTER — Telehealth: Payer: Self-pay

## 2021-06-06 DIAGNOSIS — I10 Essential (primary) hypertension: Secondary | ICD-10-CM | POA: Diagnosis not present

## 2021-06-06 DIAGNOSIS — E1122 Type 2 diabetes mellitus with diabetic chronic kidney disease: Secondary | ICD-10-CM | POA: Diagnosis not present

## 2021-06-06 DIAGNOSIS — N1831 Chronic kidney disease, stage 3a: Secondary | ICD-10-CM | POA: Diagnosis not present

## 2021-06-06 DIAGNOSIS — N179 Acute kidney failure, unspecified: Secondary | ICD-10-CM | POA: Diagnosis not present

## 2021-06-06 NOTE — Telephone Encounter (Signed)
Copied from [place] [id number]. Topic: General - Inquiry >> Jun 06, 2021  1:33 PM Robina Ade, Helene Kelp D wrote: Reason for CRM: Patient husband called and said that patient would like a call back from Dr. Ronnald Ramp CMA regarding her visit to Dr. Holley Raring office. She wants to speak about it with her.

## 2021-06-07 DIAGNOSIS — N179 Acute kidney failure, unspecified: Secondary | ICD-10-CM | POA: Diagnosis not present

## 2021-06-07 DIAGNOSIS — I129 Hypertensive chronic kidney disease with stage 1 through stage 4 chronic kidney disease, or unspecified chronic kidney disease: Secondary | ICD-10-CM | POA: Diagnosis not present

## 2021-06-07 DIAGNOSIS — E1122 Type 2 diabetes mellitus with diabetic chronic kidney disease: Secondary | ICD-10-CM | POA: Diagnosis not present

## 2021-06-07 DIAGNOSIS — E785 Hyperlipidemia, unspecified: Secondary | ICD-10-CM | POA: Diagnosis not present

## 2021-06-07 DIAGNOSIS — Z48815 Encounter for surgical aftercare following surgery on the digestive system: Secondary | ICD-10-CM | POA: Diagnosis not present

## 2021-06-07 DIAGNOSIS — K219 Gastro-esophageal reflux disease without esophagitis: Secondary | ICD-10-CM | POA: Diagnosis not present

## 2021-06-07 DIAGNOSIS — D631 Anemia in chronic kidney disease: Secondary | ICD-10-CM | POA: Diagnosis not present

## 2021-06-07 DIAGNOSIS — D696 Thrombocytopenia, unspecified: Secondary | ICD-10-CM | POA: Diagnosis not present

## 2021-06-07 DIAGNOSIS — Z7984 Long term (current) use of oral hypoglycemic drugs: Secondary | ICD-10-CM | POA: Diagnosis not present

## 2021-06-07 DIAGNOSIS — M1990 Unspecified osteoarthritis, unspecified site: Secondary | ICD-10-CM | POA: Diagnosis not present

## 2021-06-07 DIAGNOSIS — Z9049 Acquired absence of other specified parts of digestive tract: Secondary | ICD-10-CM | POA: Diagnosis not present

## 2021-06-07 DIAGNOSIS — K5730 Diverticulosis of large intestine without perforation or abscess without bleeding: Secondary | ICD-10-CM | POA: Diagnosis not present

## 2021-06-07 DIAGNOSIS — N189 Chronic kidney disease, unspecified: Secondary | ICD-10-CM | POA: Diagnosis not present

## 2021-06-07 NOTE — Telephone Encounter (Signed)
Tried calling pt, left VM for patient to return a call to our office. We can see Dr Holley Raring notes in Epic if patient is not aware of this. Thank you.

## 2021-06-14 ENCOUNTER — Ambulatory Visit: Payer: Self-pay

## 2021-06-14 DIAGNOSIS — Z9889 Other specified postprocedural states: Secondary | ICD-10-CM | POA: Diagnosis not present

## 2021-06-14 DIAGNOSIS — I129 Hypertensive chronic kidney disease with stage 1 through stage 4 chronic kidney disease, or unspecified chronic kidney disease: Secondary | ICD-10-CM | POA: Diagnosis not present

## 2021-06-14 DIAGNOSIS — K219 Gastro-esophageal reflux disease without esophagitis: Secondary | ICD-10-CM | POA: Diagnosis not present

## 2021-06-14 DIAGNOSIS — M1990 Unspecified osteoarthritis, unspecified site: Secondary | ICD-10-CM | POA: Diagnosis not present

## 2021-06-14 DIAGNOSIS — N179 Acute kidney failure, unspecified: Secondary | ICD-10-CM | POA: Diagnosis not present

## 2021-06-14 DIAGNOSIS — K5730 Diverticulosis of large intestine without perforation or abscess without bleeding: Secondary | ICD-10-CM | POA: Diagnosis not present

## 2021-06-14 DIAGNOSIS — E785 Hyperlipidemia, unspecified: Secondary | ICD-10-CM | POA: Diagnosis not present

## 2021-06-14 DIAGNOSIS — Z960 Presence of urogenital implants: Secondary | ICD-10-CM | POA: Diagnosis not present

## 2021-06-14 DIAGNOSIS — Z0189 Encounter for other specified special examinations: Secondary | ICD-10-CM | POA: Diagnosis not present

## 2021-06-14 DIAGNOSIS — D696 Thrombocytopenia, unspecified: Secondary | ICD-10-CM | POA: Diagnosis not present

## 2021-06-14 DIAGNOSIS — N189 Chronic kidney disease, unspecified: Secondary | ICD-10-CM | POA: Diagnosis not present

## 2021-06-14 DIAGNOSIS — Z48815 Encounter for surgical aftercare following surgery on the digestive system: Secondary | ICD-10-CM | POA: Diagnosis not present

## 2021-06-14 DIAGNOSIS — Z9049 Acquired absence of other specified parts of digestive tract: Secondary | ICD-10-CM | POA: Diagnosis not present

## 2021-06-14 DIAGNOSIS — E1122 Type 2 diabetes mellitus with diabetic chronic kidney disease: Secondary | ICD-10-CM | POA: Diagnosis not present

## 2021-06-14 DIAGNOSIS — Z7984 Long term (current) use of oral hypoglycemic drugs: Secondary | ICD-10-CM | POA: Diagnosis not present

## 2021-06-14 DIAGNOSIS — D631 Anemia in chronic kidney disease: Secondary | ICD-10-CM | POA: Diagnosis not present

## 2021-06-14 NOTE — Telephone Encounter (Signed)
Husband reports pt. Has had swelling from ankles to mid-shin x 1 month. Mild discomfort. No other symptoms. Requesting "a fluid pill." States "she had a mild fluid pill with her BP medicine at one time." Declines office visit. Please advise.    Answer Assessment - Initial Assessment Questions 1. ONSET: "When did the swelling start?" (e.g., minutes, hours, days)     1 month ago 2. LOCATION: "What part of the leg is swollen?"  "Are both legs swollen or just one leg?"     Both 3. SEVERITY: "How bad is the swelling?" (e.g., localized; mild, moderate, severe)  - Localized - small area of swelling localized to one leg  - MILD pedal edema - swelling limited to foot and ankle, pitting edema < 1/4 inch (6 mm) deep, rest and elevation eliminate most or all swelling  - MODERATE edema - swelling of lower leg to knee, pitting edema > 1/4 inch (6 mm) deep, rest and elevation only partially reduce swelling  - SEVERE edema - swelling extends above knee, facial or hand swelling present      Up to shin 4. REDNESS: "Does the swelling look red or infected?"     No 5. PAIN: "Is the swelling painful to touch?" If Yes, ask: "How painful is it?"   (Scale 1-10; mild, moderate or severe)     Mild 6. FEVER: "Do you have a fever?" If Yes, ask: "What is it, how was it measured, and when did it start?"      No 7. CAUSE: "What do you think is causing the leg swelling?"     Unsure 8. MEDICAL HISTORY: "Do you have a history of heart failure, kidney disease, liver failure, or cancer?"     Kidney 9. RECURRENT SYMPTOM: "Have you had leg swelling before?" If Yes, ask: "When was the last time?" "What happened that time?"     Yes 10. OTHER SYMPTOMS: "Do you have any other symptoms?" (e.g., chest pain, difficulty breathing)       No 11. PREGNANCY: "Is there any chance you are pregnant?" "When was your last menstrual period?"       No  Protocols used: Leg Swelling and Edema-A-AH

## 2021-06-19 DIAGNOSIS — Z9889 Other specified postprocedural states: Secondary | ICD-10-CM | POA: Diagnosis not present

## 2021-06-20 DIAGNOSIS — K219 Gastro-esophageal reflux disease without esophagitis: Secondary | ICD-10-CM | POA: Diagnosis not present

## 2021-06-20 DIAGNOSIS — D631 Anemia in chronic kidney disease: Secondary | ICD-10-CM | POA: Diagnosis not present

## 2021-06-20 DIAGNOSIS — I129 Hypertensive chronic kidney disease with stage 1 through stage 4 chronic kidney disease, or unspecified chronic kidney disease: Secondary | ICD-10-CM | POA: Diagnosis not present

## 2021-06-20 DIAGNOSIS — E785 Hyperlipidemia, unspecified: Secondary | ICD-10-CM | POA: Diagnosis not present

## 2021-06-20 DIAGNOSIS — E1122 Type 2 diabetes mellitus with diabetic chronic kidney disease: Secondary | ICD-10-CM | POA: Diagnosis not present

## 2021-06-20 DIAGNOSIS — K5730 Diverticulosis of large intestine without perforation or abscess without bleeding: Secondary | ICD-10-CM | POA: Diagnosis not present

## 2021-06-20 DIAGNOSIS — Z48815 Encounter for surgical aftercare following surgery on the digestive system: Secondary | ICD-10-CM | POA: Diagnosis not present

## 2021-06-20 DIAGNOSIS — Z7984 Long term (current) use of oral hypoglycemic drugs: Secondary | ICD-10-CM | POA: Diagnosis not present

## 2021-06-20 DIAGNOSIS — N189 Chronic kidney disease, unspecified: Secondary | ICD-10-CM | POA: Diagnosis not present

## 2021-06-20 DIAGNOSIS — Z9049 Acquired absence of other specified parts of digestive tract: Secondary | ICD-10-CM | POA: Diagnosis not present

## 2021-06-20 DIAGNOSIS — N179 Acute kidney failure, unspecified: Secondary | ICD-10-CM | POA: Diagnosis not present

## 2021-06-20 DIAGNOSIS — D696 Thrombocytopenia, unspecified: Secondary | ICD-10-CM | POA: Diagnosis not present

## 2021-06-20 DIAGNOSIS — K632 Fistula of intestine: Secondary | ICD-10-CM | POA: Diagnosis not present

## 2021-06-20 DIAGNOSIS — M1990 Unspecified osteoarthritis, unspecified site: Secondary | ICD-10-CM | POA: Diagnosis not present

## 2021-06-27 DIAGNOSIS — N189 Chronic kidney disease, unspecified: Secondary | ICD-10-CM | POA: Diagnosis not present

## 2021-06-27 DIAGNOSIS — Z48815 Encounter for surgical aftercare following surgery on the digestive system: Secondary | ICD-10-CM | POA: Diagnosis not present

## 2021-06-27 DIAGNOSIS — E785 Hyperlipidemia, unspecified: Secondary | ICD-10-CM | POA: Diagnosis not present

## 2021-06-27 DIAGNOSIS — Z9049 Acquired absence of other specified parts of digestive tract: Secondary | ICD-10-CM | POA: Diagnosis not present

## 2021-06-27 DIAGNOSIS — K219 Gastro-esophageal reflux disease without esophagitis: Secondary | ICD-10-CM | POA: Diagnosis not present

## 2021-06-27 DIAGNOSIS — D696 Thrombocytopenia, unspecified: Secondary | ICD-10-CM | POA: Diagnosis not present

## 2021-06-27 DIAGNOSIS — Z7984 Long term (current) use of oral hypoglycemic drugs: Secondary | ICD-10-CM | POA: Diagnosis not present

## 2021-06-27 DIAGNOSIS — N179 Acute kidney failure, unspecified: Secondary | ICD-10-CM | POA: Diagnosis not present

## 2021-06-27 DIAGNOSIS — E1122 Type 2 diabetes mellitus with diabetic chronic kidney disease: Secondary | ICD-10-CM | POA: Diagnosis not present

## 2021-06-27 DIAGNOSIS — I129 Hypertensive chronic kidney disease with stage 1 through stage 4 chronic kidney disease, or unspecified chronic kidney disease: Secondary | ICD-10-CM | POA: Diagnosis not present

## 2021-06-27 DIAGNOSIS — D631 Anemia in chronic kidney disease: Secondary | ICD-10-CM | POA: Diagnosis not present

## 2021-06-27 DIAGNOSIS — K5730 Diverticulosis of large intestine without perforation or abscess without bleeding: Secondary | ICD-10-CM | POA: Diagnosis not present

## 2021-06-27 DIAGNOSIS — M1990 Unspecified osteoarthritis, unspecified site: Secondary | ICD-10-CM | POA: Diagnosis not present

## 2021-07-04 DIAGNOSIS — E785 Hyperlipidemia, unspecified: Secondary | ICD-10-CM | POA: Diagnosis not present

## 2021-07-04 DIAGNOSIS — N179 Acute kidney failure, unspecified: Secondary | ICD-10-CM | POA: Diagnosis not present

## 2021-07-04 DIAGNOSIS — D696 Thrombocytopenia, unspecified: Secondary | ICD-10-CM | POA: Diagnosis not present

## 2021-07-04 DIAGNOSIS — E782 Mixed hyperlipidemia: Secondary | ICD-10-CM | POA: Diagnosis not present

## 2021-07-04 DIAGNOSIS — Z7984 Long term (current) use of oral hypoglycemic drugs: Secondary | ICD-10-CM | POA: Diagnosis not present

## 2021-07-04 DIAGNOSIS — Z48815 Encounter for surgical aftercare following surgery on the digestive system: Secondary | ICD-10-CM | POA: Diagnosis not present

## 2021-07-04 DIAGNOSIS — D631 Anemia in chronic kidney disease: Secondary | ICD-10-CM | POA: Diagnosis not present

## 2021-07-04 DIAGNOSIS — M1990 Unspecified osteoarthritis, unspecified site: Secondary | ICD-10-CM | POA: Diagnosis not present

## 2021-07-04 DIAGNOSIS — I10 Essential (primary) hypertension: Secondary | ICD-10-CM | POA: Diagnosis not present

## 2021-07-04 DIAGNOSIS — K56609 Unspecified intestinal obstruction, unspecified as to partial versus complete obstruction: Secondary | ICD-10-CM | POA: Diagnosis not present

## 2021-07-04 DIAGNOSIS — Z9049 Acquired absence of other specified parts of digestive tract: Secondary | ICD-10-CM | POA: Diagnosis not present

## 2021-07-04 DIAGNOSIS — I129 Hypertensive chronic kidney disease with stage 1 through stage 4 chronic kidney disease, or unspecified chronic kidney disease: Secondary | ICD-10-CM | POA: Diagnosis not present

## 2021-07-04 DIAGNOSIS — R600 Localized edema: Secondary | ICD-10-CM | POA: Diagnosis not present

## 2021-07-04 DIAGNOSIS — K5730 Diverticulosis of large intestine without perforation or abscess without bleeding: Secondary | ICD-10-CM | POA: Diagnosis not present

## 2021-07-04 DIAGNOSIS — K219 Gastro-esophageal reflux disease without esophagitis: Secondary | ICD-10-CM | POA: Diagnosis not present

## 2021-07-04 DIAGNOSIS — N189 Chronic kidney disease, unspecified: Secondary | ICD-10-CM | POA: Diagnosis not present

## 2021-07-04 DIAGNOSIS — E1122 Type 2 diabetes mellitus with diabetic chronic kidney disease: Secondary | ICD-10-CM | POA: Diagnosis not present

## 2021-07-10 DIAGNOSIS — N189 Chronic kidney disease, unspecified: Secondary | ICD-10-CM | POA: Diagnosis not present

## 2021-07-10 DIAGNOSIS — Z7984 Long term (current) use of oral hypoglycemic drugs: Secondary | ICD-10-CM | POA: Diagnosis not present

## 2021-07-10 DIAGNOSIS — I129 Hypertensive chronic kidney disease with stage 1 through stage 4 chronic kidney disease, or unspecified chronic kidney disease: Secondary | ICD-10-CM | POA: Diagnosis not present

## 2021-07-10 DIAGNOSIS — K5730 Diverticulosis of large intestine without perforation or abscess without bleeding: Secondary | ICD-10-CM | POA: Diagnosis not present

## 2021-07-10 DIAGNOSIS — E785 Hyperlipidemia, unspecified: Secondary | ICD-10-CM | POA: Diagnosis not present

## 2021-07-10 DIAGNOSIS — Z48815 Encounter for surgical aftercare following surgery on the digestive system: Secondary | ICD-10-CM | POA: Diagnosis not present

## 2021-07-10 DIAGNOSIS — N179 Acute kidney failure, unspecified: Secondary | ICD-10-CM | POA: Diagnosis not present

## 2021-07-10 DIAGNOSIS — M1990 Unspecified osteoarthritis, unspecified site: Secondary | ICD-10-CM | POA: Diagnosis not present

## 2021-07-10 DIAGNOSIS — D631 Anemia in chronic kidney disease: Secondary | ICD-10-CM | POA: Diagnosis not present

## 2021-07-10 DIAGNOSIS — D696 Thrombocytopenia, unspecified: Secondary | ICD-10-CM | POA: Diagnosis not present

## 2021-07-10 DIAGNOSIS — K219 Gastro-esophageal reflux disease without esophagitis: Secondary | ICD-10-CM | POA: Diagnosis not present

## 2021-07-10 DIAGNOSIS — E1122 Type 2 diabetes mellitus with diabetic chronic kidney disease: Secondary | ICD-10-CM | POA: Diagnosis not present

## 2021-07-10 DIAGNOSIS — Z9049 Acquired absence of other specified parts of digestive tract: Secondary | ICD-10-CM | POA: Diagnosis not present

## 2021-07-18 DIAGNOSIS — K632 Fistula of intestine: Secondary | ICD-10-CM | POA: Diagnosis not present

## 2021-07-25 DIAGNOSIS — K219 Gastro-esophageal reflux disease without esophagitis: Secondary | ICD-10-CM | POA: Diagnosis not present

## 2021-07-25 DIAGNOSIS — I129 Hypertensive chronic kidney disease with stage 1 through stage 4 chronic kidney disease, or unspecified chronic kidney disease: Secondary | ICD-10-CM | POA: Diagnosis not present

## 2021-07-25 DIAGNOSIS — Z48815 Encounter for surgical aftercare following surgery on the digestive system: Secondary | ICD-10-CM | POA: Diagnosis not present

## 2021-07-25 DIAGNOSIS — D696 Thrombocytopenia, unspecified: Secondary | ICD-10-CM | POA: Diagnosis not present

## 2021-07-25 DIAGNOSIS — Z9049 Acquired absence of other specified parts of digestive tract: Secondary | ICD-10-CM | POA: Diagnosis not present

## 2021-07-25 DIAGNOSIS — Z7984 Long term (current) use of oral hypoglycemic drugs: Secondary | ICD-10-CM | POA: Diagnosis not present

## 2021-07-25 DIAGNOSIS — E1122 Type 2 diabetes mellitus with diabetic chronic kidney disease: Secondary | ICD-10-CM | POA: Diagnosis not present

## 2021-07-25 DIAGNOSIS — M1990 Unspecified osteoarthritis, unspecified site: Secondary | ICD-10-CM | POA: Diagnosis not present

## 2021-07-25 DIAGNOSIS — D631 Anemia in chronic kidney disease: Secondary | ICD-10-CM | POA: Diagnosis not present

## 2021-07-25 DIAGNOSIS — N189 Chronic kidney disease, unspecified: Secondary | ICD-10-CM | POA: Diagnosis not present

## 2021-07-25 DIAGNOSIS — N179 Acute kidney failure, unspecified: Secondary | ICD-10-CM | POA: Diagnosis not present

## 2021-07-25 DIAGNOSIS — K5730 Diverticulosis of large intestine without perforation or abscess without bleeding: Secondary | ICD-10-CM | POA: Diagnosis not present

## 2021-07-25 DIAGNOSIS — E785 Hyperlipidemia, unspecified: Secondary | ICD-10-CM | POA: Diagnosis not present

## 2021-07-28 ENCOUNTER — Other Ambulatory Visit: Payer: Self-pay

## 2021-07-28 ENCOUNTER — Ambulatory Visit: Payer: Medicare Other | Admitting: Podiatry

## 2021-07-28 ENCOUNTER — Encounter: Payer: Self-pay | Admitting: Podiatry

## 2021-07-28 DIAGNOSIS — M79674 Pain in right toe(s): Secondary | ICD-10-CM | POA: Diagnosis not present

## 2021-07-28 DIAGNOSIS — M79675 Pain in left toe(s): Secondary | ICD-10-CM

## 2021-07-28 DIAGNOSIS — B351 Tinea unguium: Secondary | ICD-10-CM | POA: Diagnosis not present

## 2021-07-28 DIAGNOSIS — E0843 Diabetes mellitus due to underlying condition with diabetic autonomic (poly)neuropathy: Secondary | ICD-10-CM

## 2021-07-30 NOTE — Progress Notes (Signed)
° °  SUBJECTIVE Patient with a history of diabetes mellitus presents to office today complaining of elongated, thickened nails that cause pain while ambulating in shoes.  Patient is unable to trim their own nails. Patient is here for further evaluation and treatment.   Past Medical History:  Diagnosis Date   Diabetes mellitus without complication (HCC)    GERD (gastroesophageal reflux disease)    Hyperlipidemia    Hypertension    Past Surgical History:  Procedure Laterality Date   COLONOSCOPY  2009   repeat in 5 years- Dr Bary Leriche   COLONOSCOPY WITH PROPOFOL N/A 09/09/2015   Procedure: COLONOSCOPY WITH PROPOFOL;  Surgeon: Hulen Luster, MD;  Location: [facility] ENDOSCOPY;  Service: Gastroenterology;  Laterality: N/A;   FOOT SURGERY Bilateral    Bunion removal   INCONTINENCE SURGERY     VAGINAL HYSTERECTOMY     VENTRAL HERNIA REPAIR N/A 05/06/2015   Procedure: HERNIA REPAIR VENTRAL ADULT;  Surgeon: Leonie Green, MD;  Location: [facility] ORS;  Service: General;  Laterality: N/A;   No Known Allergies   OBJECTIVE General Patient is awake, alert, and oriented x 3 and in no acute distress. Derm Skin is dry and supple bilateral. Negative open lesions or macerations. Remaining integument unremarkable. Nails are tender, long, thickened and dystrophic with subungual debris, consistent with onychomycosis, 1-5 bilateral. No signs of infection noted. Vasc  DP and PT pedal pulses palpable bilaterally. Temperature gradient within normal limits.  Neuro Epicritic and protective threshold sensation diminished bilaterally.  Musculoskeletal Exam No symptomatic pedal deformities noted bilateral. Muscular strength within normal limits.  ASSESSMENT 1. Diabetes Mellitus w/ peripheral neuropathy 2.  Pain due to onychomycosis of toenails bilateral  PLAN OF CARE 1. Patient evaluated today. 2. Instructed to maintain good pedal hygiene and foot care. Stressed importance of controlling blood sugar.  3. Mechanical  debridement of nails 1-5 bilaterally performed using a nail nipper. Filed with dremel without incident.  4. Return to clinic in 3 mos.     Edrick Kins, DPM [facility]  Dr. Edrick Kins, DPM    [address], [place] [address]                Office [phone]  Fax [phone]

## 2021-08-01 ENCOUNTER — Ambulatory Visit: Payer: Medicare Other | Admitting: Podiatry

## 2021-08-10 ENCOUNTER — Telehealth: Payer: Self-pay

## 2021-08-10 ENCOUNTER — Other Ambulatory Visit: Payer: Self-pay | Admitting: Family Medicine

## 2021-08-10 DIAGNOSIS — E119 Type 2 diabetes mellitus without complications: Secondary | ICD-10-CM

## 2021-08-10 NOTE — Telephone Encounter (Signed)
-----   Message from Fredderick Severance sent at 08/10/2021 11:25 AM EST ----- Needs med refill appt for next month

## 2021-08-10 NOTE — Telephone Encounter (Signed)
Marden Noble the husband said she needed a refill for the Glipizide she will call back to set up the appointment. I also mention to him for her to call us back about the medication.

## 2021-08-18 DIAGNOSIS — Z5189 Encounter for other specified aftercare: Secondary | ICD-10-CM | POA: Diagnosis not present

## 2021-08-18 DIAGNOSIS — Z09 Encounter for follow-up examination after completed treatment for conditions other than malignant neoplasm: Secondary | ICD-10-CM | POA: Diagnosis not present

## 2021-08-26 ENCOUNTER — Other Ambulatory Visit: Payer: Self-pay | Admitting: Family Medicine

## 2021-08-26 DIAGNOSIS — K219 Gastro-esophageal reflux disease without esophagitis: Secondary | ICD-10-CM

## 2021-09-01 ENCOUNTER — Other Ambulatory Visit: Payer: Self-pay

## 2021-09-01 ENCOUNTER — Ambulatory Visit (INDEPENDENT_AMBULATORY_CARE_PROVIDER_SITE_OTHER): Payer: Medicare Other | Admitting: Family Medicine

## 2021-09-01 ENCOUNTER — Encounter: Payer: Self-pay | Admitting: Family Medicine

## 2021-09-01 VITALS — BP 130/70 | HR 72 | Ht 68.0 in | Wt 158.0 lb

## 2021-09-01 DIAGNOSIS — E7801 Familial hypercholesterolemia: Secondary | ICD-10-CM | POA: Diagnosis not present

## 2021-09-01 DIAGNOSIS — K219 Gastro-esophageal reflux disease without esophagitis: Secondary | ICD-10-CM

## 2021-09-01 DIAGNOSIS — E119 Type 2 diabetes mellitus without complications: Secondary | ICD-10-CM | POA: Diagnosis not present

## 2021-09-01 DIAGNOSIS — I10 Essential (primary) hypertension: Secondary | ICD-10-CM

## 2021-09-01 MED ORDER — METFORMIN HCL 500 MG PO TABS: ORAL_TABLET | ORAL | 1 refills | Status: DC

## 2021-09-01 MED ORDER — GLIPIZIDE ER 2.5 MG PO TB24: ORAL_TABLET | ORAL | 1 refills | Status: DC

## 2021-09-01 MED ORDER — OMEPRAZOLE 20 MG PO CPDR: DELAYED_RELEASE_CAPSULE | ORAL | 1 refills | Status: DC

## 2021-09-01 MED ORDER — ATORVASTATIN CALCIUM 20 MG PO TABS: 20.0000 mg | ORAL_TABLET | Freq: Every day | ORAL | 1 refills | Status: DC

## 2021-09-01 MED ORDER — AMLODIPINE BESYLATE 5 MG PO TABS: 5.0000 mg | ORAL_TABLET | Freq: Every day | ORAL | 1 refills | Status: DC

## 2021-09-01 NOTE — Progress Notes (Signed)
Date:  09/01/2021   Name:  Frances Harmon   DOB:  1945/11/17   MRN:  [id number]   Chief Complaint: Diabetes, Gastroesophageal Reflux, Hypertension, and Hyperlipidemia  Diabetes She presents for her follow-up diabetic visit. She has type 2 diabetes mellitus. Her disease course has been stable. There are no hypoglycemic associated symptoms. Pertinent negatives for hypoglycemia include no dizziness, headaches or nervousness/anxiousness. There are no diabetic associated symptoms. Pertinent negatives for diabetes include no chest pain and no polydipsia. There are no hypoglycemic complications. Symptoms are stable. There are no diabetic complications. Pertinent negatives for diabetic complications include no CVA, PVD or retinopathy. There are no known risk factors for coronary artery disease. Current diabetic treatment includes oral agent (dual therapy). She is following a generally healthy diet. Meal planning includes avoidance of concentrated sweets and carbohydrate counting. An ACE inhibitor/angiotensin II receptor blocker is being taken.  Gastroesophageal Reflux She reports no abdominal pain, no chest pain, no coughing, no heartburn, no nausea, no sore throat or no wheezing. The problem has been gradually improving. The symptoms are aggravated by certain foods. She has tried a PPI for the symptoms. The treatment provided moderate relief.  Hypertension This is a chronic problem. The current episode started more than 1 year ago. The problem has been gradually improving since onset. The problem is controlled. Pertinent negatives include no chest pain, headaches, neck pain, palpitations, PND or shortness of breath. There are no associated agents to hypertension. Past treatments include calcium channel blockers. There are no compliance problems.  There is no history of angina, kidney disease, CAD/MI, CVA, heart failure, left ventricular hypertrophy, PVD or retinopathy. There is no history of chronic renal  disease, a hypertension causing med or renovascular disease.  Hyperlipidemia She has no history of chronic renal disease. Pertinent negatives include no chest pain, myalgias or shortness of breath. Current antihyperlipidemic treatment includes statins. The current treatment provides moderate improvement of lipids. There are no compliance problems.  Risk factors for coronary artery disease include hypertension, diabetes mellitus and dyslipidemia.   Lab Results  Component Value Date   NA 129 (L) 04/06/2021   K 5.1 04/06/2021   CO2 14 (L) 04/06/2021   GLUCOSE 117 (H) 04/06/2021   BUN 20 04/06/2021   CREATININE 1.62 (H) 04/06/2021   CALCIUM 10.7 (H) 04/06/2021   EGFR 33 (L) 04/06/2021   GFRNONAA >60 07/27/2020   Lab Results  Component Value Date   CHOL 143 09/06/2020   HDL 51 09/06/2020   LDLCALC 72 09/06/2020   TRIG 109 09/06/2020   CHOLHDL 2.4 09/12/2017   No results found for: TSH Lab Results  Component Value Date   HGBA1C 5.9 (H) 03/07/2021   Lab Results  Component Value Date   WBC 5.9 07/27/2020   HGB 12.8 07/27/2020   HCT 38.9 07/27/2020   MCV 88.0 07/27/2020   PLT 162 07/27/2020   Lab Results  Component Value Date   ALT 11 10/22/2019   AST 13 10/22/2019   ALKPHOS 90 10/22/2019   BILITOT 0.5 10/22/2019   No results found for: 25OHVITD2, 25OHVITD3, VD25OH   Review of Systems  Constitutional:  Negative for chills and fever.  HENT:  Negative for drooling, ear discharge, ear pain and sore throat.   Respiratory:  Negative for cough, shortness of breath and wheezing.   Cardiovascular:  Negative for chest pain, palpitations, leg swelling and PND.  Gastrointestinal:  Negative for abdominal pain, blood in stool, constipation, diarrhea, heartburn and nausea.  Endocrine: Negative for polydipsia.  Genitourinary:  Negative for dysuria, frequency, hematuria and urgency.  Musculoskeletal:  Negative for back pain, myalgias and neck pain.  Skin:  Negative for rash.   Allergic/Immunologic: Negative for environmental allergies.  Neurological:  Negative for dizziness and headaches.  Hematological:  Does not bruise/bleed easily.  Psychiatric/Behavioral:  Negative for suicidal ideas. The patient is not nervous/anxious.    Patient Active Problem List   Diagnosis Date Noted   H/O total hysterectomy with bilateral salpingo-oophorectomy (BSO) 09/06/2020   Thrombocytopenia ([place]) 04/23/2019   Type 2 diabetes mellitus with complication, without long-term current use of insulin ([place]) 04/28/2015   Essential hypertension 04/28/2015   Hyperlipidemia 04/28/2015   Esophageal reflux 04/28/2015   Vitamin D deficiency 04/28/2015    No Known Allergies  Past Surgical History:  Procedure Laterality Date   COLONOSCOPY  2009   repeat in 5 years- Dr Bary Leriche   COLONOSCOPY WITH PROPOFOL N/A 09/09/2015   Procedure: COLONOSCOPY WITH PROPOFOL;  Surgeon: Hulen Luster, MD;  Location[facility] ENDOSCOPY;  Service: Gastroenterology;  Laterality: N/A;   FOOT SURGERY Bilateral    Bunion removal   INCONTINENCE SURGERY     VAGINAL HYSTERECTOMY     VENTRAL HERNIA REPAIR N/A 05/06/2015   Procedure: HERNIA REPAIR VENTRAL ADULT;  Surgeon: Leonie Green, MD;  Location: [facility] ORS;  Service: General;  Laterality: N/A;    Social History   Tobacco Use   Smoking status: Never   Smokeless tobacco: Never   Tobacco comments:    smokin cessation materials not required  Vaping Use   Vaping Use: Never used  Substance Use Topics   Alcohol use: No    Alcohol/week: 0.0 standard drinks   Drug use: No     Medication list has been reviewed and updated.  Current Meds  Medication Sig   amLODipine (NORVASC) 5 MG tablet Take by mouth.   atorvastatin (LIPITOR) 20 MG tablet Take by mouth.   furosemide (LASIX) 40 MG tablet Take 20 mg by mouth daily.   glipiZIDE (GLUCOTROL XL) 2.5 MG 24 hr tablet TAKE (1) TABLET BY MOUTH EVERY DAY AT THE SAME TIME EVERY MORNING.   metFORMIN (GLUCOPHAGE) 500  MG tablet TAKE (1) TABLET BY MOUTH TWICE DAILY   omeprazole (PRILOSEC) 20 MG capsule TAKE (1) CAPSULE BY MOUTH EVERY MORNING AT THE SAME TIME   triamcinolone (KENALOG) 0.1 % Apply 1 application topically 2 (two) times daily.    PHQ 2/9 Scores 09/01/2021 05/03/2021 03/25/2021 03/07/2021  PHQ - 2 Score 0 0 0 0  PHQ- 9 Score 0 0 0 2    GAD 7 : Generalized Anxiety Score 09/01/2021 05/03/2021 03/25/2021 03/07/2021  Nervous, Anxious, on Edge 0 0 0 0  Control/stop worrying 0 0 0 0  Worry too much - different things 0 0 0 0  Trouble relaxing 0 0 0 0  Restless 0 0 0 0  Easily annoyed or irritable 0 0 0 0  Afraid - awful might happen 0 0 0 0  Total GAD 7 Score 0 0 0 0  Anxiety Difficulty Not difficult at all - Not difficult at all -    BP Readings from Last 3 Encounters:  09/01/21 130/70  05/03/21 112/66  03/21/21 110/80    Physical Exam Vitals and nursing note reviewed.  Constitutional:      Appearance: She is well-developed.  HENT:     Head: Normocephalic.     Right Ear: External ear normal.     Left Ear:  External ear normal.  Eyes:     General: Lids are everted, no foreign bodies appreciated. No scleral icterus.       Left eye: No foreign body or hordeolum.     Conjunctiva/sclera: Conjunctivae normal.     Right eye: Right conjunctiva is not injected.     Left eye: Left conjunctiva is not injected.     Pupils: Pupils are equal, round, and reactive to light.  Neck:     Thyroid: No thyromegaly.     Vascular: No JVD.     Trachea: No tracheal deviation.  Cardiovascular:     Rate and Rhythm: Normal rate and regular rhythm.     Heart sounds: Normal heart sounds. No murmur heard.   No friction rub. No gallop.  Pulmonary:     Effort: Pulmonary effort is normal. No respiratory distress.     Breath sounds: Normal breath sounds. No wheezing or rales.  Abdominal:     General: Bowel sounds are normal.     Palpations: Abdomen is soft. There is no mass.     Tenderness: There is no abdominal  tenderness. There is no guarding or rebound.  Musculoskeletal:        General: No tenderness. Normal range of motion.     Cervical back: Normal range of motion and neck supple.  Lymphadenopathy:     Cervical: No cervical adenopathy.  Skin:    General: Skin is warm.     Findings: No rash.  Neurological:     Mental Status: She is alert and oriented to person, place, and time.     Cranial Nerves: No cranial nerve deficit.     Deep Tendon Reflexes: Reflexes normal.  Psychiatric:        Mood and Affect: Mood is not anxious or depressed.    Wt Readings from Last 3 Encounters:  09/01/21 158 lb (71.7 kg)  05/03/21 149 lb (67.6 kg)  03/21/21 158 lb (71.7 kg)    BP 130/70    Pulse 72    Ht 5' 8" (1.727 m)    Wt 158 lb (71.7 kg)    BMI 24.02 kg/m   Assessment and Plan: 1. Type 2 diabetes mellitus without complication, without long-term current use of insulin (HCC) Chronic.  Controlled.  Stable.  Course of disease is controlled with medication including glipizide XL 2.5 mg once a day and metformin 500 mg 1 twice a day.  Will check A1c and microalbuminuria. - glipiZIDE (GLUCOTROL XL) 2.5 MG 24 hr tablet; Take 1 tablet by mouth every day  Dispense: 90 tablet; Refill: 1 - metFORMIN (GLUCOPHAGE) 500 MG tablet; TAKE (1) TABLET BY MOUTH TWICE DAILY  Dispense: 180 tablet; Refill: 1 - HgB A1c - Microalbumin, urine  2. GERD without esophagitis Chronic.  Controlled.  Stable.  Patient having no breakthrough reflux and is tolerating omeprazole 20 mg once a day. - omeprazole (PRILOSEC) 20 MG capsule; Take 1 capsule by mouth every morning  Dispense: 90 capsule; Refill: 1  3. Essential hypertension Chronic.  Controlled.  Stable.  Blood pressure 130/70.  Continue amlodipine 5 mg once a day. - amLODipine (NORVASC) 5 MG tablet; Take 1 tablet (5 mg total) by mouth daily.  Dispense: 90 tablet; Refill: 1  4. Familial hypercholesterolemia Chronic.  Controlled.  Stable.  Continue atorvastatin 20 mg once a  day.  Will check lipid panel and encourage low-cholesterol intake diet. - Lipid Panel With LDL/HDL Ratio - atorvastatin (LIPITOR) 20 MG tablet; Take 1 tablet (20 mg  total) by mouth daily.  Dispense: 90 tablet; Refill: 1

## 2021-09-04 LAB — LIPID PANEL WITH LDL/HDL RATIO
Cholesterol, Total: 128 mg/dL (ref 100–199)
HDL: 59 mg/dL (ref >39)
LDL Chol Calc (NIH): 53 mg/dL (ref 0–99)
LDL/HDL Ratio: 0.9 ratio (ref 0.0–3.2)
Triglycerides: 82 mg/dL (ref 0–149)
VLDL Cholesterol Cal: 16 mg/dL (ref 5–40)

## 2021-09-04 LAB — HEMOGLOBIN A1C
Est. average glucose Bld gHb Est-mCnc: 100 mg/dL
Hgb A1c MFr Bld: 5.1 % (ref 4.8–5.6)

## 2021-09-04 LAB — MICROALBUMIN, URINE: Microalbumin, Urine: 17.2 ug/mL

## 2021-10-09 DIAGNOSIS — N1831 Chronic kidney disease, stage 3a: Secondary | ICD-10-CM | POA: Diagnosis not present

## 2021-10-09 DIAGNOSIS — N1832 Chronic kidney disease, stage 3b: Secondary | ICD-10-CM | POA: Diagnosis not present

## 2021-10-09 DIAGNOSIS — R600 Localized edema: Secondary | ICD-10-CM | POA: Diagnosis not present

## 2021-10-09 DIAGNOSIS — I10 Essential (primary) hypertension: Secondary | ICD-10-CM | POA: Diagnosis not present

## 2021-10-09 DIAGNOSIS — E1122 Type 2 diabetes mellitus with diabetic chronic kidney disease: Secondary | ICD-10-CM | POA: Diagnosis not present

## 2021-11-02 ENCOUNTER — Encounter: Payer: Self-pay | Admitting: Podiatry

## 2021-11-02 ENCOUNTER — Ambulatory Visit: Payer: Medicare Other | Admitting: Podiatry

## 2021-11-02 DIAGNOSIS — M79674 Pain in right toe(s): Secondary | ICD-10-CM | POA: Diagnosis not present

## 2021-11-02 DIAGNOSIS — B351 Tinea unguium: Secondary | ICD-10-CM

## 2021-11-02 DIAGNOSIS — E0843 Diabetes mellitus due to underlying condition with diabetic autonomic (poly)neuropathy: Secondary | ICD-10-CM

## 2021-11-02 DIAGNOSIS — M79675 Pain in left toe(s): Secondary | ICD-10-CM | POA: Diagnosis not present

## 2021-11-02 DIAGNOSIS — D696 Thrombocytopenia, unspecified: Secondary | ICD-10-CM

## 2021-11-02 NOTE — Progress Notes (Signed)
This patient returns to my office for at risk foot care.  This patient requires this care by a professional since this patient will be at risk due to having type 2 diabetes.  This patient is unable to cut nails herself since the patient cannot reach her nails.These nails are painful walking and wearing shoes.  This patient presents for at risk foot care today.  General Appearance  Alert, conversant and in no acute stress.  Vascular  Dorsalis pedis and posterior tibial  pulses are palpable  bilaterally.  Capillary return is within normal limits  bilaterally. Temperature is within normal limits  bilaterally.  Neurologic  Senn-Weinstein monofilament wire test within normal limits  bilaterally. Muscle power within normal limits bilaterally.  Nails Thick disfigured discolored nails with subungual debris  from hallux to fifth toes bilaterally. No evidence of bacterial infection or drainage bilaterally.  Orthopedic  No limitations of motion  feet .  No crepitus or effusions noted.  No bony pathology or digital deformities noted.  Midtarsal  DJD  B/L.  Skin  normotropic skin with no porokeratosis noted bilaterally.  No signs of infections or ulcers noted.     Onychomycosis  Pain in right toes  Pain in left toes  Consent was obtained for treatment procedures.   Mechanical debridement of nails 1-5  bilaterally performed with a nail nipper.  Filed with dremel without incident.    Return office visit  3 months                    Told patient to return for periodic foot care and evaluation due to potential at risk complications.   Jakaiden Fill DPM  

## 2021-11-14 DIAGNOSIS — E782 Mixed hyperlipidemia: Secondary | ICD-10-CM | POA: Diagnosis not present

## 2021-11-14 DIAGNOSIS — R579 Shock, unspecified: Secondary | ICD-10-CM | POA: Diagnosis not present

## 2021-11-14 DIAGNOSIS — I10 Essential (primary) hypertension: Secondary | ICD-10-CM | POA: Diagnosis not present

## 2021-11-14 DIAGNOSIS — R600 Localized edema: Secondary | ICD-10-CM | POA: Diagnosis not present

## 2022-01-01 DIAGNOSIS — L821 Other seborrheic keratosis: Secondary | ICD-10-CM | POA: Diagnosis not present

## 2022-01-01 DIAGNOSIS — L853 Xerosis cutis: Secondary | ICD-10-CM | POA: Diagnosis not present

## 2022-01-01 DIAGNOSIS — L570 Actinic keratosis: Secondary | ICD-10-CM | POA: Diagnosis not present

## 2022-01-02 ENCOUNTER — Ambulatory Visit: Payer: Self-pay | Admitting: *Deleted

## 2022-01-02 ENCOUNTER — Encounter: Payer: Self-pay | Admitting: Family Medicine

## 2022-01-02 ENCOUNTER — Ambulatory Visit (INDEPENDENT_AMBULATORY_CARE_PROVIDER_SITE_OTHER): Payer: Medicare Other | Admitting: Family Medicine

## 2022-01-02 VITALS — BP 138/76 | HR 64 | Ht 68.0 in | Wt 162.0 lb

## 2022-01-02 DIAGNOSIS — E7801 Familial hypercholesterolemia: Secondary | ICD-10-CM

## 2022-01-02 DIAGNOSIS — E119 Type 2 diabetes mellitus without complications: Secondary | ICD-10-CM | POA: Diagnosis not present

## 2022-01-02 DIAGNOSIS — K632 Fistula of intestine: Secondary | ICD-10-CM | POA: Diagnosis not present

## 2022-01-02 DIAGNOSIS — K219 Gastro-esophageal reflux disease without esophagitis: Secondary | ICD-10-CM

## 2022-01-02 DIAGNOSIS — I10 Essential (primary) hypertension: Secondary | ICD-10-CM

## 2022-01-02 MED ORDER — METFORMIN HCL 500 MG PO TABS: ORAL_TABLET | ORAL | 1 refills | Status: DC

## 2022-01-02 MED ORDER — AMLODIPINE BESYLATE 5 MG PO TABS: 5.0000 mg | ORAL_TABLET | Freq: Every day | ORAL | 1 refills | Status: DC

## 2022-01-02 MED ORDER — ATORVASTATIN CALCIUM 20 MG PO TABS: 20.0000 mg | ORAL_TABLET | Freq: Every day | ORAL | 1 refills | Status: DC

## 2022-01-02 MED ORDER — GLIPIZIDE ER 2.5 MG PO TB24: ORAL_TABLET | ORAL | 1 refills | Status: DC

## 2022-01-02 MED ORDER — OMEPRAZOLE 20 MG PO CPDR: DELAYED_RELEASE_CAPSULE | ORAL | 1 refills | Status: DC

## 2022-01-02 NOTE — Telephone Encounter (Signed)
Message from Loma Boston sent at 01/02/2022 11:25 AM EDT  Summary: clearance for CT   [phone] pls fu with  pt needing  CT Abdomen scan in re to her having a leakage and needs medical approval to do so           Call History   Type Contact Phone/Fax User  01/02/2022 11:24 AM EDT Phone (Incoming) Docia Barrier D (Emergency Contact) [phone] Loma Boston   Reason for Disposition . [1] Follow-up call to recent contact AND [2] information only call, no triage required  Answer Assessment - Initial Assessment Questions 1. REASON FOR CALL or QUESTION: "What is your reason for calling today?" or "How can I best help you?" or "What question do you have that I can help answer?"     See documentation note for details  Protocols used: Information Only Call - No Triage-A-AH

## 2022-01-02 NOTE — Telephone Encounter (Signed)
I returned the call to Docia Barrier.   Destyni there with him.   She is needing clearance for a CT scan of her abd for [facility] from Dr. Ronnald Ramp.  She also wants to discuss her elevated BP, the need to possibly restart the amlodipine, and her medication refills.  Bassy got on the phone and an appt was made with Dr. Ronnald Ramp for today at 2:00 to discuss all the above issues.

## 2022-01-02 NOTE — Progress Notes (Signed)
Date:  01/02/2022   Name:  Frances Harmon   DOB:  September 13, 1945   MRN:  [id number]   Chief Complaint: Hypertension  Hypertension This is a chronic problem. The current episode started more than 1 year ago. The problem has been waxing and waning since onset. The problem is controlled (barely). Pertinent negatives include no anxiety, chest pain, headaches, orthopnea, palpitations, PND or shortness of breath. There are no associated agents to hypertension. Past treatments include calcium channel blockers and diuretics. The current treatment provides mild improvement. There are no compliance problems.  There is no history of angina, kidney disease, CAD/MI, CVA, heart failure, left ventricular hypertrophy, PVD or retinopathy. There is no history of chronic renal disease, a hypertension causing med or renovascular disease.  Diabetes She presents for her follow-up diabetic visit. She has type 2 diabetes mellitus. Pertinent negatives for hypoglycemia include no headaches. Pertinent negatives for diabetes include no chest pain, no polydipsia and no polyuria. Pertinent negatives for diabetic complications include no CVA, PVD or retinopathy. Current diabetic treatment includes oral agent (dual therapy). She is following a generally healthy diet. An ACE inhibitor/angiotensin II receptor blocker is not being taken.  Gastroesophageal Reflux She reports no abdominal pain, no chest pain, no coughing, no nausea or no wheezing.  Hyperlipidemia This is a chronic problem. The problem is controlled. Exacerbating diseases include diabetes. She has no history of chronic renal disease. Pertinent negatives include no chest pain or shortness of breath. Current antihyperlipidemic treatment includes statins. The current treatment provides moderate improvement of lipids.    Lab Results  Component Value Date   NA 129 (L) 04/06/2021   K 5.1 04/06/2021   CO2 14 (L) 04/06/2021   GLUCOSE 117 (H) 04/06/2021   BUN 20  04/06/2021   CREATININE 1.62 (H) 04/06/2021   CALCIUM 10.7 (H) 04/06/2021   EGFR 33 (L) 04/06/2021   GFRNONAA >60 07/27/2020   Lab Results  Component Value Date   CHOL 128 09/01/2021   HDL 59 09/01/2021   LDLCALC 53 09/01/2021   TRIG 82 09/01/2021   CHOLHDL 2.4 09/12/2017   No results found for: "TSH" Lab Results  Component Value Date   HGBA1C 5.1 09/01/2021   Lab Results  Component Value Date   WBC 5.9 07/27/2020   HGB 12.8 07/27/2020   HCT 38.9 07/27/2020   MCV 88.0 07/27/2020   PLT 162 07/27/2020   Lab Results  Component Value Date   ALT 11 10/22/2019   AST 13 10/22/2019   ALKPHOS 90 10/22/2019   BILITOT 0.5 10/22/2019   No results found for: "25OHVITD2", "25OHVITD3", "VD25OH"   Review of Systems  Respiratory:  Negative for cough, chest tightness, shortness of breath and wheezing.   Cardiovascular:  Negative for chest pain, palpitations, orthopnea, leg swelling and PND.  Gastrointestinal:  Negative for abdominal pain, constipation, nausea and rectal pain.  Endocrine: Negative for polydipsia and polyuria.  Neurological:  Negative for headaches.    Patient Active Problem List   Diagnosis Date Noted   H/O total hysterectomy with bilateral salpingo-oophorectomy (BSO) 09/06/2020   Thrombocytopenia ([place]) 04/23/2019   Type 2 diabetes mellitus with complication, without long-term current use of insulin ([place]) 04/28/2015   Essential hypertension 04/28/2015   Hyperlipidemia 04/28/2015   Esophageal reflux 04/28/2015   Vitamin D deficiency 04/28/2015    No Known Allergies  Past Surgical History:  Procedure Laterality Date   COLONOSCOPY  2009   repeat in 5 years- Dr Bary Leriche   COLONOSCOPY WITH  PROPOFOL N/A 09/09/2015   Procedure: COLONOSCOPY WITH PROPOFOL;  Surgeon: Hulen Luster, MD;  Location: [facility] ENDOSCOPY;  Service: Gastroenterology;  Laterality: N/A;   FOOT SURGERY Bilateral    Bunion removal   INCONTINENCE SURGERY     VAGINAL HYSTERECTOMY     VENTRAL HERNIA  REPAIR N/A 05/06/2015   Procedure: HERNIA REPAIR VENTRAL ADULT;  Surgeon: Leonie Green, MD;  Location: [facility] ORS;  Service: General;  Laterality: N/A;    Social History   Tobacco Use   Smoking status: Never   Smokeless tobacco: Never   Tobacco comments:    smokin cessation materials not required  Vaping Use   Vaping Use: Never used  Substance Use Topics   Alcohol use: No    Alcohol/week: 0.0 standard drinks of alcohol   Drug use: No     Medication list has been reviewed and updated.  Current Meds  Medication Sig   amLODipine (NORVASC) 2.5 MG tablet Take 2.5 mg by mouth daily.   atorvastatin (LIPITOR) 20 MG tablet Take 1 tablet (20 mg total) by mouth daily.   cyclobenzaprine (FLEXERIL) 5 MG tablet Take 1 tablet (5 mg total) by mouth 3 (three) times daily as needed for muscle spasms.   furosemide (LASIX) 40 MG tablet Take 20 mg by mouth daily.   glipiZIDE (GLUCOTROL XL) 2.5 MG 24 hr tablet Take 1 tablet by mouth every day   metFORMIN (GLUCOPHAGE) 500 MG tablet TAKE (1) TABLET BY MOUTH TWICE DAILY   omeprazole (PRILOSEC) 20 MG capsule Take 1 capsule by mouth every morning   triamcinolone (KENALOG) 0.1 % Apply 1 application topically 2 (two) times daily.   [DISCONTINUED] amLODipine (NORVASC) 5 MG tablet Take 1 tablet (5 mg total) by mouth daily.       01/02/2022    2:32 PM 09/01/2021    3:07 PM 05/03/2021   11:58 AM 03/25/2021   11:11 AM  GAD 7 : Generalized Anxiety Score  Nervous, Anxious, on Edge 0 0 0 0  Control/stop worrying 0 0 0 0  Worry too much - different things 0 0 0 0  Trouble relaxing 0 0 0 0  Restless 0 0 0 0  Easily annoyed or irritable 0 0 0 0  Afraid - awful might happen 0 0 0 0  Total GAD 7 Score 0 0 0 0  Anxiety Difficulty Not difficult at all Not difficult at all  Not difficult at all       01/02/2022    2:31 PM  Depression screen PHQ 2/9  Decreased Interest 0  Down, Depressed, Hopeless 0  PHQ - 2 Score 0  Altered sleeping 1  Tired,  decreased energy 0  Change in appetite 0  Feeling bad or failure about yourself  0  Trouble concentrating 0  Moving slowly or fidgety/restless 0  Suicidal thoughts 0  PHQ-9 Score 1  Difficult doing work/chores Not difficult at all    BP Readings from Last 3 Encounters:  01/02/22 138/76  09/01/21 130/70  05/03/21 112/66    Physical Exam Vitals and nursing note reviewed. Exam conducted with a chaperone present.  Constitutional:      General: She is not in acute distress.    Appearance: She is not diaphoretic.  HENT:     Head: Normocephalic and atraumatic.     Right Ear: External ear normal.     Left Ear: External ear normal.     Nose: Nose normal. No congestion or rhinorrhea.  Mouth/Throat:     Mouth: Mucous membranes are moist.  Eyes:     General:        Right eye: No discharge.        Left eye: No discharge.     Conjunctiva/sclera: Conjunctivae normal.     Pupils: Pupils are equal, round, and reactive to light.  Neck:     Thyroid: No thyromegaly.     Vascular: No JVD.  Cardiovascular:     Rate and Rhythm: Normal rate and regular rhythm.     Heart sounds: Normal heart sounds. No murmur heard.    No friction rub. No gallop.  Pulmonary:     Effort: Pulmonary effort is normal.     Breath sounds: Normal breath sounds. No wheezing or rhonchi.  Abdominal:     General: Bowel sounds are normal.     Palpations: Abdomen is soft. There is no mass.     Tenderness: There is no abdominal tenderness. There is no guarding.  Musculoskeletal:        General: Normal range of motion.     Cervical back: Normal range of motion and neck supple.  Lymphadenopathy:     Cervical: No cervical adenopathy.  Skin:    General: Skin is warm and dry.  Neurological:     Mental Status: She is alert.     Deep Tendon Reflexes: Reflexes are normal and symmetric.     Wt Readings from Last 3 Encounters:  01/02/22 162 lb (73.5 kg)  09/01/21 158 lb (71.7 kg)  05/03/21 149 lb (67.6 kg)    BP  138/76   Pulse 64   Ht '5\' 8"'  (1.727 m)   Wt 162 lb (73.5 kg)   BMI 24.63 kg/m   Assessment and Plan:  1. Familial hypercholesterolemia Chronic.  Controlled.  Stable.  Continue atorvastatin 20 mg once a day. - atorvastatin (LIPITOR) 20 MG tablet; Take 1 tablet (20 mg total) by mouth daily.  Dispense: 90 tablet; Refill: 1  2. GERD without esophagitis Chronic.  Controlled.  Stable.  Continue omeprazole 20 mg once a day. - omeprazole (PRILOSEC) 20 MG capsule; Take 1 capsule by mouth every morning  Dispense: 90 capsule; Refill: 1  3. Type 2 diabetes mellitus without complication, without long-term current use of insulin (HCC) Chronic.  Controlled.  Stable.  Continue metformin 500 mg twice a day and glipizide XL 2.5 mg once a day.  We will check A1c. - metFORMIN (GLUCOPHAGE) 500 MG tablet; TAKE (1) TABLET BY MOUTH TWICE DAILY  Dispense: 180 tablet; Refill: 1 - glipiZIDE (GLUCOTROL XL) 2.5 MG 24 hr tablet; Take 1 tablet by mouth every day  Dispense: 90 tablet; Refill: 1  4. Primary hypertension Chronic.  Controlled.  Stable.  Blood pressure is 138/130 8/76.  I recently increased to 5 mg amlodipine but I think when she was seen by cardiologist he kept it at 2.5 not knowing that I had increased to 5 mg.  Seen that she is at the edge of control we will resume 5 mg amlodipine once a day and I will recheck at her next scheduled appointment in August. - amLODipine (NORVASC) 5 MG tablet; Take 1 tablet (5 mg total) by mouth daily.  Dispense: 90 tablet; Refill: 1

## 2022-01-03 DIAGNOSIS — E1122 Type 2 diabetes mellitus with diabetic chronic kidney disease: Secondary | ICD-10-CM | POA: Diagnosis not present

## 2022-01-03 DIAGNOSIS — N2581 Secondary hyperparathyroidism of renal origin: Secondary | ICD-10-CM | POA: Diagnosis not present

## 2022-01-03 DIAGNOSIS — D631 Anemia in chronic kidney disease: Secondary | ICD-10-CM | POA: Diagnosis not present

## 2022-01-03 DIAGNOSIS — I10 Essential (primary) hypertension: Secondary | ICD-10-CM | POA: Diagnosis not present

## 2022-01-03 DIAGNOSIS — N1832 Chronic kidney disease, stage 3b: Secondary | ICD-10-CM | POA: Diagnosis not present

## 2022-01-10 ENCOUNTER — Inpatient Hospital Stay: Payer: Medicare Other

## 2022-01-10 ENCOUNTER — Encounter: Payer: Self-pay | Admitting: Oncology

## 2022-01-10 ENCOUNTER — Inpatient Hospital Stay: Payer: Medicare Other | Attending: Oncology | Admitting: Oncology

## 2022-01-10 VITALS — BP 171/77 | HR 67 | Temp 97.3°F | Resp 18 | Ht 68.0 in | Wt 162.0 lb

## 2022-01-10 DIAGNOSIS — Z79899 Other long term (current) drug therapy: Secondary | ICD-10-CM | POA: Diagnosis not present

## 2022-01-10 DIAGNOSIS — N183 Chronic kidney disease, stage 3 unspecified: Secondary | ICD-10-CM | POA: Diagnosis not present

## 2022-01-10 DIAGNOSIS — D631 Anemia in chronic kidney disease: Secondary | ICD-10-CM | POA: Insufficient documentation

## 2022-01-10 DIAGNOSIS — E538 Deficiency of other specified B group vitamins: Secondary | ICD-10-CM | POA: Insufficient documentation

## 2022-01-10 DIAGNOSIS — I129 Hypertensive chronic kidney disease with stage 1 through stage 4 chronic kidney disease, or unspecified chronic kidney disease: Secondary | ICD-10-CM | POA: Diagnosis not present

## 2022-01-10 DIAGNOSIS — D509 Iron deficiency anemia, unspecified: Secondary | ICD-10-CM | POA: Insufficient documentation

## 2022-01-10 NOTE — Addendum Note (Signed)
Addended by: Drue Dun on: 01/10/2022 03:39 PM   Modules accepted: Orders

## 2022-01-10 NOTE — Progress Notes (Signed)
Hematology/Oncology Consult note [facility] [phone] [phone]  Patient Care Team: Juline Patch, MD as PCP - General (Family Medicine)   Name of the patient: Frances Harmon  [id number]  1945/08/13    Reason for referral-anemia of kidney disease   Referring physician-Dr. Holley Raring  Date of visit: 01/10/22   History of presenting illness-patient is a 76 year old female with a past medical history significant for,Hypertension hyperlipidemia type 2 diabetes stage III CKD who has been referred for anemia.  Most recent CBC 01/03/2022 showed white cell count of 4.5, H&H of 9.6/28.7 and a platelet count of 104.  Patient's hemoglobin back in October 2022 was normal at 12.2 and then gradually drifting down since then.  Platelets were 183 back in October 2022 and presently 104.  Most recent serum creatinine was 1.2.  Calcium and total protein was normal.  Iron studies showed a low ferritin of 8 on 01/02/2022.  Folate levels were normal at 16.7.B12 levels were low at 134.  B6 was normal.  Patient had a history of bowel obstruction requiring partial colectomy last year complicated by ureteral injury.  This wound is healing with secondary intention.  Patient denies any blood loss in her stool or urine.  Denies any dark melanotic stools.  Reports ongoing fatigue.  ECOG PS- 1  Pain scale- 0   Review of systems- Review of Systems  Constitutional:  Positive for malaise/fatigue. Negative for chills, fever and weight loss.  HENT:  Negative for congestion, ear discharge and nosebleeds.   Eyes:  Negative for blurred vision.  Respiratory:  Negative for cough, hemoptysis, sputum production, shortness of breath and wheezing.   Cardiovascular:  Negative for chest pain, palpitations, orthopnea and claudication.  Gastrointestinal:  Negative for abdominal pain, blood in stool, constipation, diarrhea, heartburn, melena, nausea and vomiting.  Genitourinary:   Negative for dysuria, flank pain, frequency, hematuria and urgency.  Musculoskeletal:  Negative for back pain, joint pain and myalgias.  Skin:  Negative for rash.  Neurological:  Negative for dizziness, tingling, focal weakness, seizures, weakness and headaches.  Endo/Heme/Allergies:  Does not bruise/bleed easily.  Psychiatric/Behavioral:  Negative for depression and suicidal ideas. The patient does not have insomnia.     No Known Allergies  Patient Active Problem List   Diagnosis Date Noted   H/O total hysterectomy with bilateral salpingo-oophorectomy (BSO) 09/06/2020   Thrombocytopenia ([place]) 04/23/2019   Type 2 diabetes mellitus with complication, without long-term current use of insulin ([place]) 04/28/2015   Essential hypertension 04/28/2015   Hyperlipidemia 04/28/2015   Esophageal reflux 04/28/2015   Vitamin D deficiency 04/28/2015     Past Medical History:  Diagnosis Date   Diabetes mellitus without complication (HCC)    GERD (gastroesophageal reflux disease)    Hyperlipidemia    Hypertension      Past Surgical History:  Procedure Laterality Date   COLONOSCOPY  2009   repeat in 5 years- Dr Bary Leriche   COLONOSCOPY WITH PROPOFOL N/A 09/09/2015   Procedure: COLONOSCOPY WITH PROPOFOL;  Surgeon: Hulen Luster, MD;  [facility] ENDOSCOPY;  Service: Gastroenterology;  Laterality: N/A;   FOOT SURGERY Bilateral    Bunion removal   INCONTINENCE SURGERY     VAGINAL HYSTERECTOMY     VENTRAL HERNIA REPAIR N/A 05/06/2015   Procedure: HERNIA REPAIR VENTRAL ADULT;  Surgeon: Leonie Green, MD;  Location: [facility] ORS;  Service: General;  Laterality: N/A;    Social History   Socioeconomic History   Marital status: Married  Spouse name: Not on file   Number of children: 2   Years of education: Not on file   Highest education level: 12th grade  Occupational History   Occupation: Retired    Comment: works part time  Tobacco Use   Smoking status: Never   Smokeless tobacco:  Never   Tobacco comments:    smokin cessation materials not required  Vaping Use   Vaping Use: Never used  Substance and Sexual Activity   Alcohol use: No    Alcohol/week: 0.0 standard drinks of alcohol   Drug use: No   Sexual activity: Not Currently  Other Topics Concern   Not on file  Social History Narrative   Not on file   Social Determinants of Health   Financial Resource Strain: Low Risk  (03/25/2021)   Overall Financial Resource Strain (CARDIA)    Difficulty of Paying Living Expenses: Not hard at all  Food Insecurity: No Food Insecurity (03/25/2021)   Hunger Vital Sign    Worried About Running Out of Food in the Last Year: Never true    [place] in the Last Year: Never true  Transportation Needs: No Transportation Needs (03/25/2021)   PRAPARE - Hydrologist (Medical): No    Lack of Transportation (Non-Medical): No  Physical Activity: Sufficiently Active (03/25/2021)   Exercise Vital Sign    Days of Exercise per Week: 7 days    Minutes of Exercise per Session: 40 min  Stress: No Stress Concern Present (03/25/2021)   [place]    Feeling of Stress : Only a little  Social Connections: Unknown (03/02/2020)   Social Connection and Isolation Panel [NHANES]    Frequency of Communication with Friends and Family: Patient refused    Frequency of Social Gatherings with Friends and Family: Patient refused    Attends Religious Services: Patient refused    Active Member of Clubs or Organizations: Patient refused    Attends Archivist Meetings: Patient refused    Marital Status: Married  Human resources officer Violence: Not At Risk (03/25/2021)   Humiliation, Afraid, Rape, and Kick questionnaire    Fear of Current or Ex-Partner: No    Emotionally Abused: No    Physically Abused: No    Sexually Abused: No     Family History  Problem Relation Age of Onset   Breast cancer Daughter 74    Breast cancer Mother 11   Hypertension Brother    Diabetes Brother    Arthritis Brother      Current Outpatient Medications:    amLODipine (NORVASC) 5 MG tablet, Take 1 tablet (5 mg total) by mouth daily., Disp: 90 tablet, Rfl: 1   atorvastatin (LIPITOR) 20 MG tablet, Take 1 tablet (20 mg total) by mouth daily., Disp: 90 tablet, Rfl: 1   cyclobenzaprine (FLEXERIL) 5 MG tablet, Take 1 tablet (5 mg total) by mouth 3 (three) times daily as needed for muscle spasms., Disp: 30 tablet, Rfl: 1   furosemide (LASIX) 40 MG tablet, Take 20 mg by mouth daily., Disp: , Rfl:    glipiZIDE (GLUCOTROL XL) 2.5 MG 24 hr tablet, Take 1 tablet by mouth every day, Disp: 90 tablet, Rfl: 1   metFORMIN (GLUCOPHAGE) 500 MG tablet, TAKE (1) TABLET BY MOUTH TWICE DAILY, Disp: 180 tablet, Rfl: 1   omeprazole (PRILOSEC) 20 MG capsule, Take 1 capsule by mouth every morning, Disp: 90 capsule, Rfl: 1   triamcinolone (KENALOG)  0.1 %, Apply 1 application topically 2 (two) times daily., Disp: 30 g, Rfl: 0   Physical exam:  Vitals:   01/10/22 1437  BP: (!) 171/77  Pulse: 67  Resp: 18  Temp: (!) 97.3 F (36.3 C)  TempSrc: Tympanic  SpO2: 100%  Weight: 162 lb (73.5 kg)  Height: '5\' 8"'$  (1.727 m)   Physical Exam Constitutional:      General: She is not in acute distress. Cardiovascular:     Rate and Rhythm: Normal rate and regular rhythm.     Heart sounds: Normal heart sounds.  Pulmonary:     Effort: Pulmonary effort is normal.     Breath sounds: Normal breath sounds.  Abdominal:     General: Bowel sounds are normal.     Palpations: Abdomen is soft.  Skin:    General: Skin is warm and dry.  Neurological:     Mental Status: She is alert and oriented to person, place, and time.           Latest Ref Rng & Units 04/06/2021   10:46 AM  CMP  Glucose 65 - 99 mg/dL 117   BUN 8 - 27 mg/dL 20   Creatinine 0.57 - 1.00 mg/dL 1.62   Sodium 134 - 144 mmol/L 129   Potassium 3.5 - 5.2 mmol/L 5.1   Chloride 96 -  106 mmol/L 99   CO2 20 - 29 mmol/L 14   Calcium 8.7 - 10.3 mg/dL 10.7       Latest Ref Rng & Units 07/27/2020    9:18 AM  CBC  WBC 4.0 - 10.5 K/uL 5.9   Hemoglobin 12.0 - 15.0 g/dL 12.8   Hematocrit 36.0 - 46.0 % 38.9   Platelets 150 - 400 K/uL 162    Assessment and plan- Patient is a 76 y.o. female referred for normocytic anemia  Suspect normocytic anemia likely a combination of iron and B12 deficiency although her CKD may also be contributing to some extent.  Patient recently had blood work 2 weeks ago which did show a low ferritin as well as a low B12 level.  I therefore recommend giving her IV iron either Feraheme 510 mg IV x2 doses versus Venofer 200 mg IV x5 doses depending on what her insurance will approve.  Also recommend B12 injections for 4 doses along with IV iron followed by monthly injections.  We will repeat CBC ferritin and iron studies B12 myeloma panel and serum free light chains in 3 months and I will see her thereafter   Thank you for this kind referral and the opportunity to participate in the care of this patient   Visit Diagnosis 1. Iron deficiency anemia, unspecified iron deficiency anemia type   2. B12 deficiency   3. Anemia of chronic kidney failure, stage 3 (moderate) (HCC)     Dr. Randa Evens, MD, MPH [facility] [phone] 01/10/2022

## 2022-01-11 DIAGNOSIS — K632 Fistula of intestine: Secondary | ICD-10-CM | POA: Diagnosis not present

## 2022-01-12 ENCOUNTER — Inpatient Hospital Stay: Payer: Medicare Other

## 2022-01-12 VITALS — BP 170/77 | HR 71 | Temp 99.3°F | Resp 18

## 2022-01-12 DIAGNOSIS — D509 Iron deficiency anemia, unspecified: Secondary | ICD-10-CM

## 2022-01-12 DIAGNOSIS — D631 Anemia in chronic kidney disease: Secondary | ICD-10-CM | POA: Diagnosis not present

## 2022-01-12 DIAGNOSIS — I129 Hypertensive chronic kidney disease with stage 1 through stage 4 chronic kidney disease, or unspecified chronic kidney disease: Secondary | ICD-10-CM | POA: Diagnosis not present

## 2022-01-12 DIAGNOSIS — N183 Chronic kidney disease, stage 3 unspecified: Secondary | ICD-10-CM | POA: Diagnosis not present

## 2022-01-12 DIAGNOSIS — E538 Deficiency of other specified B group vitamins: Secondary | ICD-10-CM | POA: Diagnosis not present

## 2022-01-12 DIAGNOSIS — Z79899 Other long term (current) drug therapy: Secondary | ICD-10-CM | POA: Diagnosis not present

## 2022-01-12 MED ORDER — CYANOCOBALAMIN 1000 MCG/ML IJ SOLN
1000.0000 ug | Freq: Once | INTRAMUSCULAR | Status: AC
  Administered 2022-01-12: 1000 ug via INTRAMUSCULAR
  Filled 2022-01-12: qty 1

## 2022-01-12 MED ORDER — SODIUM CHLORIDE 0.9 % IV SOLN
Freq: Once | INTRAVENOUS | Status: AC
  Filled 2022-01-12: qty 250

## 2022-01-12 MED ORDER — SODIUM CHLORIDE 0.9 % IV SOLN
510.0000 mg | INTRAVENOUS | Status: DC
  Administered 2022-01-12: 510 mg via INTRAVENOUS
  Filled 2022-01-12: qty 17

## 2022-01-19 ENCOUNTER — Inpatient Hospital Stay: Payer: Medicare Other

## 2022-01-19 VITALS — BP 171/67 | HR 70 | Temp 98.1°F

## 2022-01-19 DIAGNOSIS — D631 Anemia in chronic kidney disease: Secondary | ICD-10-CM | POA: Diagnosis not present

## 2022-01-19 DIAGNOSIS — D509 Iron deficiency anemia, unspecified: Secondary | ICD-10-CM | POA: Diagnosis not present

## 2022-01-19 DIAGNOSIS — I129 Hypertensive chronic kidney disease with stage 1 through stage 4 chronic kidney disease, or unspecified chronic kidney disease: Secondary | ICD-10-CM | POA: Diagnosis not present

## 2022-01-19 DIAGNOSIS — E538 Deficiency of other specified B group vitamins: Secondary | ICD-10-CM | POA: Diagnosis not present

## 2022-01-19 DIAGNOSIS — Z79899 Other long term (current) drug therapy: Secondary | ICD-10-CM | POA: Diagnosis not present

## 2022-01-19 DIAGNOSIS — N183 Chronic kidney disease, stage 3 unspecified: Secondary | ICD-10-CM | POA: Diagnosis not present

## 2022-01-19 MED ORDER — SODIUM CHLORIDE 0.9 % IV SOLN
Freq: Once | INTRAVENOUS | Status: AC
  Filled 2022-01-19: qty 250

## 2022-01-19 MED ORDER — CYANOCOBALAMIN 1000 MCG/ML IJ SOLN
1000.0000 ug | Freq: Once | INTRAMUSCULAR | Status: AC
  Administered 2022-01-19: 1000 ug via INTRAMUSCULAR
  Filled 2022-01-19: qty 1

## 2022-01-19 MED ORDER — SODIUM CHLORIDE 0.9 % IV SOLN
510.0000 mg | INTRAVENOUS | Status: DC
  Administered 2022-01-19: 510 mg via INTRAVENOUS
  Filled 2022-01-19: qty 510

## 2022-01-22 DIAGNOSIS — E1122 Type 2 diabetes mellitus with diabetic chronic kidney disease: Secondary | ICD-10-CM | POA: Diagnosis not present

## 2022-01-22 DIAGNOSIS — N1832 Chronic kidney disease, stage 3b: Secondary | ICD-10-CM | POA: Diagnosis not present

## 2022-01-24 ENCOUNTER — Telehealth: Payer: Self-pay | Admitting: Family Medicine

## 2022-01-24 NOTE — Telephone Encounter (Signed)
Copied from [place] [id number]. Topic: General - Other >> Jan 24, 2022 10:05 AM Leilani Able wrote: Pt wanting to know what to do about BP/ called office, pls return call  yesterday 153/80 This am  178/83/  now at 10:00 159/79 pls fu to advise. [phone] or [phone]

## 2022-01-26 ENCOUNTER — Inpatient Hospital Stay: Payer: Medicare Other | Attending: Oncology

## 2022-01-26 DIAGNOSIS — D509 Iron deficiency anemia, unspecified: Secondary | ICD-10-CM | POA: Diagnosis not present

## 2022-01-26 DIAGNOSIS — Z79899 Other long term (current) drug therapy: Secondary | ICD-10-CM | POA: Diagnosis not present

## 2022-01-26 DIAGNOSIS — E538 Deficiency of other specified B group vitamins: Secondary | ICD-10-CM | POA: Diagnosis not present

## 2022-01-26 MED ORDER — CYANOCOBALAMIN 1000 MCG/ML IJ SOLN
1000.0000 ug | Freq: Once | INTRAMUSCULAR | Status: AC
  Administered 2022-01-26: 1000 ug via INTRAMUSCULAR
  Filled 2022-01-26: qty 1

## 2022-01-26 MED FILL — Ferumoxytol Inj 510 MG/17ML (30 MG/ML) (Elemental Fe): INTRAVENOUS | Qty: 17 | Status: AC

## 2022-02-01 ENCOUNTER — Ambulatory Visit: Payer: Medicare Other | Admitting: Podiatry

## 2022-02-01 ENCOUNTER — Encounter: Payer: Self-pay | Admitting: Podiatry

## 2022-02-01 DIAGNOSIS — D696 Thrombocytopenia, unspecified: Secondary | ICD-10-CM

## 2022-02-01 DIAGNOSIS — M79675 Pain in left toe(s): Secondary | ICD-10-CM | POA: Diagnosis not present

## 2022-02-01 DIAGNOSIS — E0843 Diabetes mellitus due to underlying condition with diabetic autonomic (poly)neuropathy: Secondary | ICD-10-CM

## 2022-02-01 DIAGNOSIS — M79674 Pain in right toe(s): Secondary | ICD-10-CM | POA: Diagnosis not present

## 2022-02-01 DIAGNOSIS — B351 Tinea unguium: Secondary | ICD-10-CM | POA: Diagnosis not present

## 2022-02-01 NOTE — Progress Notes (Signed)
This patient returns to my office for at risk foot care.  This patient requires this care by a professional since this patient will be at risk due to having type 2 diabetes.  This patient is unable to cut nails herself since the patient cannot reach her nails.These nails are painful walking and wearing shoes.  This patient presents for at risk foot care today.  General Appearance  Alert, conversant and in no acute stress.  Vascular  Dorsalis pedis and posterior tibial  pulses are palpable  bilaterally.  Capillary return is within normal limits  bilaterally. Temperature is within normal limits  bilaterally.  Neurologic  Senn-Weinstein monofilament wire test within normal limits  bilaterally. Muscle power within normal limits bilaterally.  Nails Thick disfigured discolored nails with subungual debris  from hallux to fifth toes bilaterally. No evidence of bacterial infection or drainage bilaterally.  Orthopedic  No limitations of motion  feet .  No crepitus or effusions noted.  No bony pathology or digital deformities noted.  Midtarsal  DJD  B/L.  Skin  normotropic skin with no porokeratosis noted bilaterally.  No signs of infections or ulcers noted.     Onychomycosis  Pain in right toes  Pain in left toes  Consent was obtained for treatment procedures.   Mechanical debridement of nails 1-5  bilaterally performed with a nail nipper.  Filed with dremel without incident.    Return office visit  3 months                    Told patient to return for periodic foot care and evaluation due to potential at risk complications.   Jaxon Flatt DPM  

## 2022-02-02 ENCOUNTER — Inpatient Hospital Stay: Payer: Medicare Other

## 2022-02-02 DIAGNOSIS — D509 Iron deficiency anemia, unspecified: Secondary | ICD-10-CM | POA: Diagnosis not present

## 2022-02-02 DIAGNOSIS — Z79899 Other long term (current) drug therapy: Secondary | ICD-10-CM | POA: Diagnosis not present

## 2022-02-02 DIAGNOSIS — E538 Deficiency of other specified B group vitamins: Secondary | ICD-10-CM | POA: Diagnosis not present

## 2022-02-02 MED ORDER — CYANOCOBALAMIN 1000 MCG/ML IJ SOLN
1000.0000 ug | Freq: Once | INTRAMUSCULAR | Status: AC
  Administered 2022-02-02: 1000 ug via INTRAMUSCULAR
  Filled 2022-02-02: qty 1

## 2022-02-02 MED FILL — Ferumoxytol Inj 510 MG/17ML (30 MG/ML) (Elemental Fe): INTRAVENOUS | Qty: 17 | Status: AC

## 2022-02-12 ENCOUNTER — Ambulatory Visit (INDEPENDENT_AMBULATORY_CARE_PROVIDER_SITE_OTHER): Payer: Medicare Other | Admitting: Family Medicine

## 2022-02-12 ENCOUNTER — Encounter: Payer: Self-pay | Admitting: Family Medicine

## 2022-02-12 VITALS — BP 138/78 | HR 68 | Ht 68.0 in | Wt 162.0 lb

## 2022-02-12 DIAGNOSIS — I10 Essential (primary) hypertension: Secondary | ICD-10-CM

## 2022-02-12 MED ORDER — CARVEDILOL 3.125 MG PO TABS: 3.1250 mg | ORAL_TABLET | Freq: Two times a day (BID) | ORAL | 3 refills | Status: DC

## 2022-02-12 NOTE — Progress Notes (Signed)
Date:  02/12/2022   Name:  Frances Harmon   DOB:  1945-12-17   MRN:  [id number]   Chief Complaint: Hypertension (Has been on Amlodipine 80m x 1 month- b/p is running high when going to doctor's appt. And when checking at home with brachial cuff)  Hypertension This is a chronic problem. The current episode started more than 1 year ago. The problem has been gradually improving since onset. The problem is controlled. Pertinent negatives include no anxiety, blurred vision, chest pain, headaches, orthopnea, palpitations, PND or shortness of breath. Past treatments include calcium channel blockers and diuretics. The current treatment provides moderate improvement. There are no compliance problems.     Lab Results  Component Value Date   NA 129 (L) 04/06/2021   K 5.1 04/06/2021   CO2 14 (L) 04/06/2021   GLUCOSE 117 (H) 04/06/2021   BUN 20 04/06/2021   CREATININE 1.62 (H) 04/06/2021   CALCIUM 10.7 (H) 04/06/2021   EGFR 33 (L) 04/06/2021   GFRNONAA >60 07/27/2020   Lab Results  Component Value Date   CHOL 128 09/01/2021   HDL 59 09/01/2021   LDLCALC 53 09/01/2021   TRIG 82 09/01/2021   CHOLHDL 2.4 09/12/2017   No results found for: "TSH" Lab Results  Component Value Date   HGBA1C 5.1 09/01/2021   Lab Results  Component Value Date   WBC 5.9 07/27/2020   HGB 12.8 07/27/2020   HCT 38.9 07/27/2020   MCV 88.0 07/27/2020   PLT 162 07/27/2020   Lab Results  Component Value Date   ALT 11 10/22/2019   AST 13 10/22/2019   ALKPHOS 90 10/22/2019   BILITOT 0.5 10/22/2019   No results found for: "25OHVITD2", "25OHVITD3", "VD25OH"   Review of Systems  Eyes:  Negative for blurred vision.  Respiratory:  Negative for shortness of breath.   Cardiovascular:  Negative for chest pain, palpitations, orthopnea and PND.  Neurological:  Negative for headaches.    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   Physical deconditioning 11/17/2020   Large bowel obstruction ([facility] 11/14/2020   Shock circulatory ([place] 11/05/2020   Sepsis ([place] 11/05/2020   Mesenteric ischemia ([place] 11/05/2020   Acute respiratory failure with hypoxia ([place] 11/05/2020   H/O total hysterectomy with bilateral salpingo-oophorectomy (BSO) 09/06/2020   Thrombocytopenia ([facility] 04/23/2019   Type 2 diabetes mellitus with complication, without long-term current use of insulin ([place] 04/28/2015   Essential hypertension 04/28/2015   Hyperlipidemia 04/28/2015   Esophageal reflux 04/28/2015   Vitamin D deficiency 04/28/2015    No Known Allergies  Past Surgical History:  Procedure Laterality Date   COLONOSCOPY  2009   repeat in 5 years- Dr SBary Leriche  COLONOSCOPY WITH PROPOFOL N/A 09/09/2015   Procedure: COLONOSCOPY WITH PROPOFOL;  Surgeon: PHulen Luster MD;  [facility];  Service: Gastroenterology;  Laterality: N/A;   FOOT SURGERY Bilateral    Bunion removal   INCONTINENCE SURGERY     VAGINAL HYSTERECTOMY     VENTRAL HERNIA REPAIR N/A 05/06/2015   Procedure: HERNIA REPAIR VENTRAL ADULT;  Surgeon: JLeonie Green MD;  Location: [facility] ORS;  Service: General;  Laterality: N/A;    Social History   Tobacco Use   Smoking status: Never   Smokeless tobacco: Never   Tobacco comments:  smokin cessation materials not required  Vaping Use   Vaping Use: Never used  Substance Use Topics   Alcohol use: No    Alcohol/week: 0.0 standard drinks of alcohol   Drug use: No     Medication list has been reviewed and updated.  Current Meds  Medication Sig   amLODipine (NORVASC) 5 MG tablet Take 1 tablet (5 mg total) by mouth daily.   atorvastatin (LIPITOR) 20 MG tablet Take 1 tablet (20 mg total) by mouth daily.   furosemide (LASIX) 40 MG tablet Take 40 mg by mouth every other day.   glipiZIDE  (GLUCOTROL XL) 2.5 MG 24 hr tablet Take 1 tablet by mouth every day   metFORMIN (GLUCOPHAGE) 500 MG tablet TAKE (1) TABLET BY MOUTH TWICE DAILY   omeprazole (PRILOSEC) 20 MG capsule Take 1 capsule by mouth every morning       02/12/2022    3:11 PM 01/02/2022    2:32 PM 09/01/2021    3:07 PM 05/03/2021   11:58 AM  GAD 7 : Generalized Anxiety Score  Nervous, Anxious, on Edge 0 0 0 0  Control/stop worrying 0 0 0 0  Worry too much - different things 0 0 0 0  Trouble relaxing 0 0 0 0  Restless 0 0 0 0  Easily annoyed or irritable 0 0 0 0  Afraid - awful might happen 0 0 0 0  Total GAD 7 Score 0 0 0 0  Anxiety Difficulty Not difficult at all Not difficult at all Not difficult at all        02/12/2022    3:10 PM 01/02/2022    2:31 PM 09/01/2021    3:07 PM  Depression screen PHQ 2/9  Decreased Interest 0 0 0  Down, Depressed, Hopeless 0 0 0  PHQ - 2 Score 0 0 0  Altered sleeping 1 1 0  Tired, decreased energy 1 0 0  Change in appetite 0 0 0  Feeling bad or failure about yourself  0 0 0  Trouble concentrating 0 0 0  Moving slowly or fidgety/restless 0 0 0  Suicidal thoughts 0 0 0  PHQ-9 Score 2 1 0  Difficult doing work/chores Not difficult at all Not difficult at all Not difficult at all    BP Readings from Last 3 Encounters:  02/12/22 (!) 144/80  01/19/22 (!) 171/67  01/12/22 (!) 170/77    Physical Exam Vitals and nursing note reviewed.  HENT:     Head: Normocephalic.     Right Ear: Tympanic membrane normal.     Left Ear: Tympanic membrane normal.     Nose: Nose normal.  Cardiovascular:     Rate and Rhythm: Normal rate.     Pulses: Normal pulses.     Heart sounds: No murmur heard.    No friction rub. No gallop.  Pulmonary:     Effort: No respiratory distress.     Breath sounds: Normal breath sounds. No stridor. No wheezing, rhonchi or rales.  Chest:     Chest wall: No tenderness.  Abdominal:     Palpations: There is no hepatomegaly or splenomegaly.      Tenderness: There is no abdominal tenderness.     Wt Readings from Last 3 Encounters:  02/12/22 162 lb (73.5 kg)  01/10/22 162 lb (73.5 kg)  01/02/22 162 lb (73.5 kg)    BP (!) 144/80   Pulse 68   Ht '5\' 8"'  (1.727 m)   Wt 162 lb (73.5  kg)   BMI 24.63 kg/m   Assessment and Plan:  1. Essential hypertension Chronic.  Relative control in clinic.  However elevated readings by patient brought in from another clinic.  Initial blood pressure reading today was elevated on second reading.  Barely in treatment range.  Currently is on amlodipine 5 mg and furosemide.  I am going to check her renal function panel for electrolytes and GFR as well as initiate Coreg 3.125 1 twice a day and recheck patient upon return in August. - Renal Function Panel - carvedilol (COREG) 3.125 MG tablet; Take 1 tablet (3.125 mg total) by mouth 2 (two) times daily with a meal.  Dispense: 60 tablet; Refill: 3

## 2022-02-13 DIAGNOSIS — K632 Fistula of intestine: Secondary | ICD-10-CM | POA: Diagnosis not present

## 2022-02-13 LAB — RENAL FUNCTION PANEL
Albumin: 4.1 g/dL (ref 3.8–4.8)
BUN/Creatinine Ratio: 15 (ref 12–28)
BUN: 21 mg/dL (ref 8–27)
CO2: 23 mmol/L (ref 20–29)
Calcium: 9.5 mg/dL (ref 8.7–10.3)
Chloride: 111 mmol/L — ABNORMAL HIGH (ref 96–106)
Creatinine, Ser: 1.43 mg/dL — ABNORMAL HIGH (ref 0.57–1.00)
Glucose: 92 mg/dL (ref 70–99)
Phosphorus: 3.5 mg/dL (ref 3.0–4.3)
Potassium: 4.8 mmol/L (ref 3.5–5.2)
Sodium: 145 mmol/L — ABNORMAL HIGH (ref 134–144)
eGFR: 38 mL/min/{1.73_m2} — ABNORMAL LOW (ref >59)

## 2022-02-20 DIAGNOSIS — K632 Fistula of intestine: Secondary | ICD-10-CM | POA: Diagnosis not present

## 2022-03-02 ENCOUNTER — Ambulatory Visit (INDEPENDENT_AMBULATORY_CARE_PROVIDER_SITE_OTHER): Payer: Medicare Other | Admitting: Family Medicine

## 2022-03-02 ENCOUNTER — Encounter: Payer: Self-pay | Admitting: Family Medicine

## 2022-03-02 VITALS — BP 150/82 | HR 67 | Ht 68.0 in | Wt 165.0 lb

## 2022-03-02 DIAGNOSIS — E119 Type 2 diabetes mellitus without complications: Secondary | ICD-10-CM | POA: Diagnosis not present

## 2022-03-02 DIAGNOSIS — K219 Gastro-esophageal reflux disease without esophagitis: Secondary | ICD-10-CM

## 2022-03-02 DIAGNOSIS — I10 Essential (primary) hypertension: Secondary | ICD-10-CM

## 2022-03-02 DIAGNOSIS — E7801 Familial hypercholesterolemia: Secondary | ICD-10-CM | POA: Diagnosis not present

## 2022-03-02 MED ORDER — OMEPRAZOLE 20 MG PO CPDR: DELAYED_RELEASE_CAPSULE | ORAL | 1 refills | Status: DC

## 2022-03-02 MED ORDER — GLIPIZIDE ER 2.5 MG PO TB24: ORAL_TABLET | ORAL | 1 refills | Status: DC

## 2022-03-02 MED ORDER — AMLODIPINE BESYLATE 5 MG PO TABS
5.0000 mg | ORAL_TABLET | Freq: Every day | ORAL | 1 refills | Status: DC
Start: 2022-03-02 — End: 2022-03-02

## 2022-03-02 MED ORDER — METFORMIN HCL 500 MG PO TABS: ORAL_TABLET | ORAL | 1 refills | Status: DC

## 2022-03-02 MED ORDER — AMLODIPINE BESYLATE 10 MG PO TABS
10.0000 mg | ORAL_TABLET | Freq: Every day | ORAL | 0 refills | Status: DC
Start: 2022-03-02 — End: 2022-03-30

## 2022-03-02 MED ORDER — ATORVASTATIN CALCIUM 20 MG PO TABS: 20.0000 mg | ORAL_TABLET | Freq: Every day | ORAL | 1 refills | Status: DC

## 2022-03-02 MED ORDER — CARVEDILOL 3.125 MG PO TABS: 3.1250 mg | ORAL_TABLET | Freq: Two times a day (BID) | ORAL | 3 refills | Status: DC

## 2022-03-02 NOTE — Progress Notes (Unsigned)
Date:  03/02/2022   Name:  Frances Harmon   DOB:  1946/04/16   MRN:  [id number]   Chief Complaint: Gastroesophageal Reflux, Diabetes, Hypertension, and Hyperlipidemia  Gastroesophageal Reflux She reports no chest pain, no choking, no dysphagia, no heartburn or no wheezing. This is a chronic problem. The problem has been gradually improving. Pertinent negatives include no anemia, fatigue, melena, muscle weakness, orthopnea or weight loss. She has tried a PPI for the symptoms. The treatment provided moderate relief.  Diabetes She presents for her follow-up diabetic visit. She has type 2 diabetes mellitus. Her disease course has been improving. Pertinent negatives for hypoglycemia include no headaches. Pertinent negatives for diabetes include no chest pain, no fatigue and no weight loss.  Hypertension Pertinent negatives include no chest pain or headaches.  Hyperlipidemia Pertinent negatives include no chest pain.    Lab Results  Component Value Date   NA 145 (H) 02/12/2022   K 4.8 02/12/2022   CO2 23 02/12/2022   GLUCOSE 92 02/12/2022   BUN 21 02/12/2022   CREATININE 1.43 (H) 02/12/2022   CALCIUM 9.5 02/12/2022   EGFR 38 (L) 02/12/2022   GFRNONAA >60 07/27/2020   Lab Results  Component Value Date   CHOL 128 09/01/2021   HDL 59 09/01/2021   LDLCALC 53 09/01/2021   TRIG 82 09/01/2021   CHOLHDL 2.4 09/12/2017   No results found for: "TSH" Lab Results  Component Value Date   HGBA1C 5.1 09/01/2021   Lab Results  Component Value Date   WBC 5.9 07/27/2020   HGB 12.8 07/27/2020   HCT 38.9 07/27/2020   MCV 88.0 07/27/2020   PLT 162 07/27/2020   Lab Results  Component Value Date   ALT 11 10/22/2019   AST 13 10/22/2019   ALKPHOS 90 10/22/2019   BILITOT 0.5 10/22/2019   No results found for: "25OHVITD2", "25OHVITD3", "VD25OH"   Review of Systems  Constitutional:  Negative for fatigue and weight loss.  Respiratory:  Negative for choking, chest tightness and  wheezing.   Cardiovascular:  Negative for chest pain.  Gastrointestinal:  Negative for dysphagia, heartburn and melena.  Musculoskeletal:  Negative for muscle weakness.  Neurological:  Negative for headaches.    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   Physical deconditioning 11/17/2020   Large bowel obstruction ([place]) 11/14/2020   Shock circulatory ([place]) 11/05/2020   Sepsis ([place]) 11/05/2020   Mesenteric ischemia ([place]) 11/05/2020   Acute respiratory failure with hypoxia ([place]) 11/05/2020   H/O total hysterectomy with bilateral salpingo-oophorectomy (BSO) 09/06/2020   Thrombocytopenia ([place]) 04/23/2019   Type 2 diabetes mellitus with complication, without long-term current use of insulin ([place]) 04/28/2015   Essential hypertension 04/28/2015   Hyperlipidemia 04/28/2015   Esophageal reflux 04/28/2015   Vitamin D deficiency 04/28/2015    No Known Allergies  Past Surgical History:  Procedure Laterality Date   COLONOSCOPY  2009   repeat in 5 years- Dr Bary Leriche   COLONOSCOPY WITH PROPOFOL N/A 09/09/2015   Procedure: COLONOSCOPY WITH PROPOFOL;  Surgeon: Hulen Luster, MD;  [facility];  Service: Gastroenterology;  Laterality: N/A;   FOOT SURGERY Bilateral    Bunion removal   INCONTINENCE SURGERY     VAGINAL HYSTERECTOMY     VENTRAL HERNIA REPAIR N/A 05/06/2015   Procedure: HERNIA REPAIR  VENTRAL ADULT;  Surgeon: Leonie Green, MD;  Location: [facility] ORS;  Service: General;  Laterality: N/A;    Social History   Tobacco Use   Smoking status: Never   Smokeless tobacco: Never   Tobacco comments:    smokin cessation materials not required  Vaping Use   Vaping Use: Never used  Substance Use Topics   Alcohol use: No    Alcohol/week: 0.0 standard drinks of alcohol   Drug use:  No     Medication list has been reviewed and updated.  Current Meds  Medication Sig   furosemide (LASIX) 40 MG tablet Take 40 mg by mouth every other day.   [DISCONTINUED] amLODipine (NORVASC) 5 MG tablet Take 1 tablet (5 mg total) by mouth daily.   [DISCONTINUED] atorvastatin (LIPITOR) 20 MG tablet Take 1 tablet (20 mg total) by mouth daily.   [DISCONTINUED] carvedilol (COREG) 3.125 MG tablet Take 1 tablet (3.125 mg total) by mouth 2 (two) times daily with a meal.   [DISCONTINUED] glipiZIDE (GLUCOTROL XL) 2.5 MG 24 hr tablet Take 1 tablet by mouth every day   [DISCONTINUED] metFORMIN (GLUCOPHAGE) 500 MG tablet TAKE (1) TABLET BY MOUTH TWICE DAILY   [DISCONTINUED] omeprazole (PRILOSEC) 20 MG capsule Take 1 capsule by mouth every morning       02/12/2022    3:11 PM 01/02/2022    2:32 PM 09/01/2021    3:07 PM 05/03/2021   11:58 AM  GAD 7 : Generalized Anxiety Score  Nervous, Anxious, on Edge 0 0 0 0  Control/stop worrying 0 0 0 0  Worry too much - different things 0 0 0 0  Trouble relaxing 0 0 0 0  Restless 0 0 0 0  Easily annoyed or irritable 0 0 0 0  Afraid - awful might happen 0 0 0 0  Total GAD 7 Score 0 0 0 0  Anxiety Difficulty Not difficult at all Not difficult at all Not difficult at all        02/12/2022    3:10 PM 01/02/2022    2:31 PM 09/01/2021    3:07 PM  Depression screen PHQ 2/9  Decreased Interest 0 0 0  Down, Depressed, Hopeless 0 0 0  PHQ - 2 Score 0 0 0  Altered sleeping 1 1 0  Tired, decreased energy 1 0 0  Change in appetite 0 0 0  Feeling bad or failure about yourself  0 0 0  Trouble concentrating 0 0 0  Moving slowly or fidgety/restless 0 0 0  Suicidal thoughts 0 0 0  PHQ-9 Score 2 1 0  Difficult doing work/chores Not difficult at all Not difficult at all Not difficult at all    BP Readings from Last 3 Encounters:  03/02/22 (!) 150/82  02/12/22 138/78  01/19/22 (!) 171/67    Physical Exam Vitals and nursing note reviewed.  Constitutional:       Appearance: She is well-developed.  HENT:     Head: Normocephalic.     Right Ear: External ear normal.     Left Ear: External ear normal.  Eyes:     General: Lids are everted, no foreign bodies appreciated. No scleral icterus.       Left eye: No foreign body or hordeolum.     Conjunctiva/sclera: Conjunctivae normal.     Right eye: Right conjunctiva is not injected.     Left eye: Left conjunctiva is not injected.     Pupils: Pupils are equal, round, and reactive  to light.  Neck:     Thyroid: No thyromegaly.     Vascular: No JVD.     Trachea: No tracheal deviation.  Cardiovascular:     Rate and Rhythm: Normal rate and regular rhythm.     Heart sounds: Normal heart sounds. No murmur heard.    No friction rub. No gallop.  Pulmonary:     Effort: Pulmonary effort is normal. No respiratory distress.     Breath sounds: Normal breath sounds. No wheezing, rhonchi or rales.  Abdominal:     General: Bowel sounds are normal.     Palpations: Abdomen is soft. There is no mass.     Tenderness: There is no abdominal tenderness. There is no guarding or rebound.  Musculoskeletal:        General: No tenderness. Normal range of motion.     Cervical back: Normal range of motion and neck supple.  Lymphadenopathy:     Cervical: No cervical adenopathy.  Skin:    General: Skin is warm.     Findings: No rash.  Neurological:     Mental Status: She is alert and oriented to person, place, and time.     Cranial Nerves: No cranial nerve deficit.     Deep Tendon Reflexes: Reflexes normal.  Psychiatric:        Mood and Affect: Mood is not anxious or depressed.     Wt Readings from Last 3 Encounters:  03/02/22 165 lb (74.8 kg)  02/12/22 162 lb (73.5 kg)  01/10/22 162 lb (73.5 kg)    BP (!) 150/82   Pulse 67   Ht _0  (1.727 m)   Wt 165 lb (74.8 kg)   SpO2 96%   BMI 25.09 kg/m   Assessment and Plan:  1. Primary hypertension Controlled.  Stable.  Blood pressure 150/80.  Asymptomatic.   Tolerating medication well.  We will continue amlodipine we will recheck patient in 6 months.  Review of previous readings have been in normal range the patient has also been encouraged to reduce her sodium intake. - amLODipine (NORVASC) 5 MG tablet; Take 1 tablet (5 mg total) by mouth daily.  Dispense: 90 tablet; Refill: 1  2. Familial hypercholesterolemia Chronic.  Old.  Stable.  Continue atorvastatin 20 mg once a day - atorvastatin (LIPITOR) 20 MG tablet; Take 1 tablet (20 mg total) by mouth daily.  Dispense: 90 tablet; Refill: 1  3. Essential hypertension Chronic.  Controlled.  Stable.  Continue carvedilol 3.125 twice a day. - carvedilol (COREG) 3.125 MG tablet; Take 1 tablet (3.125 mg total) by mouth 2 (two) times daily with a meal.  Dispense: 60 tablet; Refill: 3  4. Type 2 diabetes mellitus without complication, without long-term current use of insulin (HCC) Chronic.  Controlled.  Stable.  Tolerating medications well.  Review of last A1c excellent control range at 5.16 months ago and we will glipizide XL 2.5 once a day. - glipiZIDE (GLUCOTROL XL) 2.5 MG 24 hr tablet; Take 1 tablet by mouth every day  Dispense: 90 tablet; Refill: 1 - metFORMIN (GLUCOPHAGE) 500 MG tablet; TAKE (1) TABLET BY MOUTH TWICE DAILY  Dispense: 180 tablet; Refill: 1  5. GERD without esophagitis Chronic.  Controlled.  Stable.  Continue omeprazole 20 mg - omeprazole (PRILOSEC) 20 MG capsule; Take 1 capsule by mouth every morning  Dispense: 90 capsule; Refill: 1    Otilio Miu, MD

## 2022-03-05 ENCOUNTER — Encounter: Payer: Self-pay | Admitting: Family Medicine

## 2022-03-05 ENCOUNTER — Inpatient Hospital Stay: Payer: Medicare Other | Attending: Oncology

## 2022-03-05 DIAGNOSIS — D509 Iron deficiency anemia, unspecified: Secondary | ICD-10-CM

## 2022-03-05 DIAGNOSIS — E538 Deficiency of other specified B group vitamins: Secondary | ICD-10-CM | POA: Diagnosis not present

## 2022-03-05 MED ORDER — CYANOCOBALAMIN 1000 MCG/ML IJ SOLN
1000.0000 ug | Freq: Once | INTRAMUSCULAR | Status: AC
  Administered 2022-03-05: 1000 ug via INTRAMUSCULAR
  Filled 2022-03-05: qty 1

## 2022-03-25 IMAGING — CR DG CHEST 2V
2 series · 3 of 3 positions shown · non-contrast
Comparison: 07/27/2020

CLINICAL DATA: Follow-up pneumonia

EXAM:
CHEST - 2 VIEW

[chest pa]
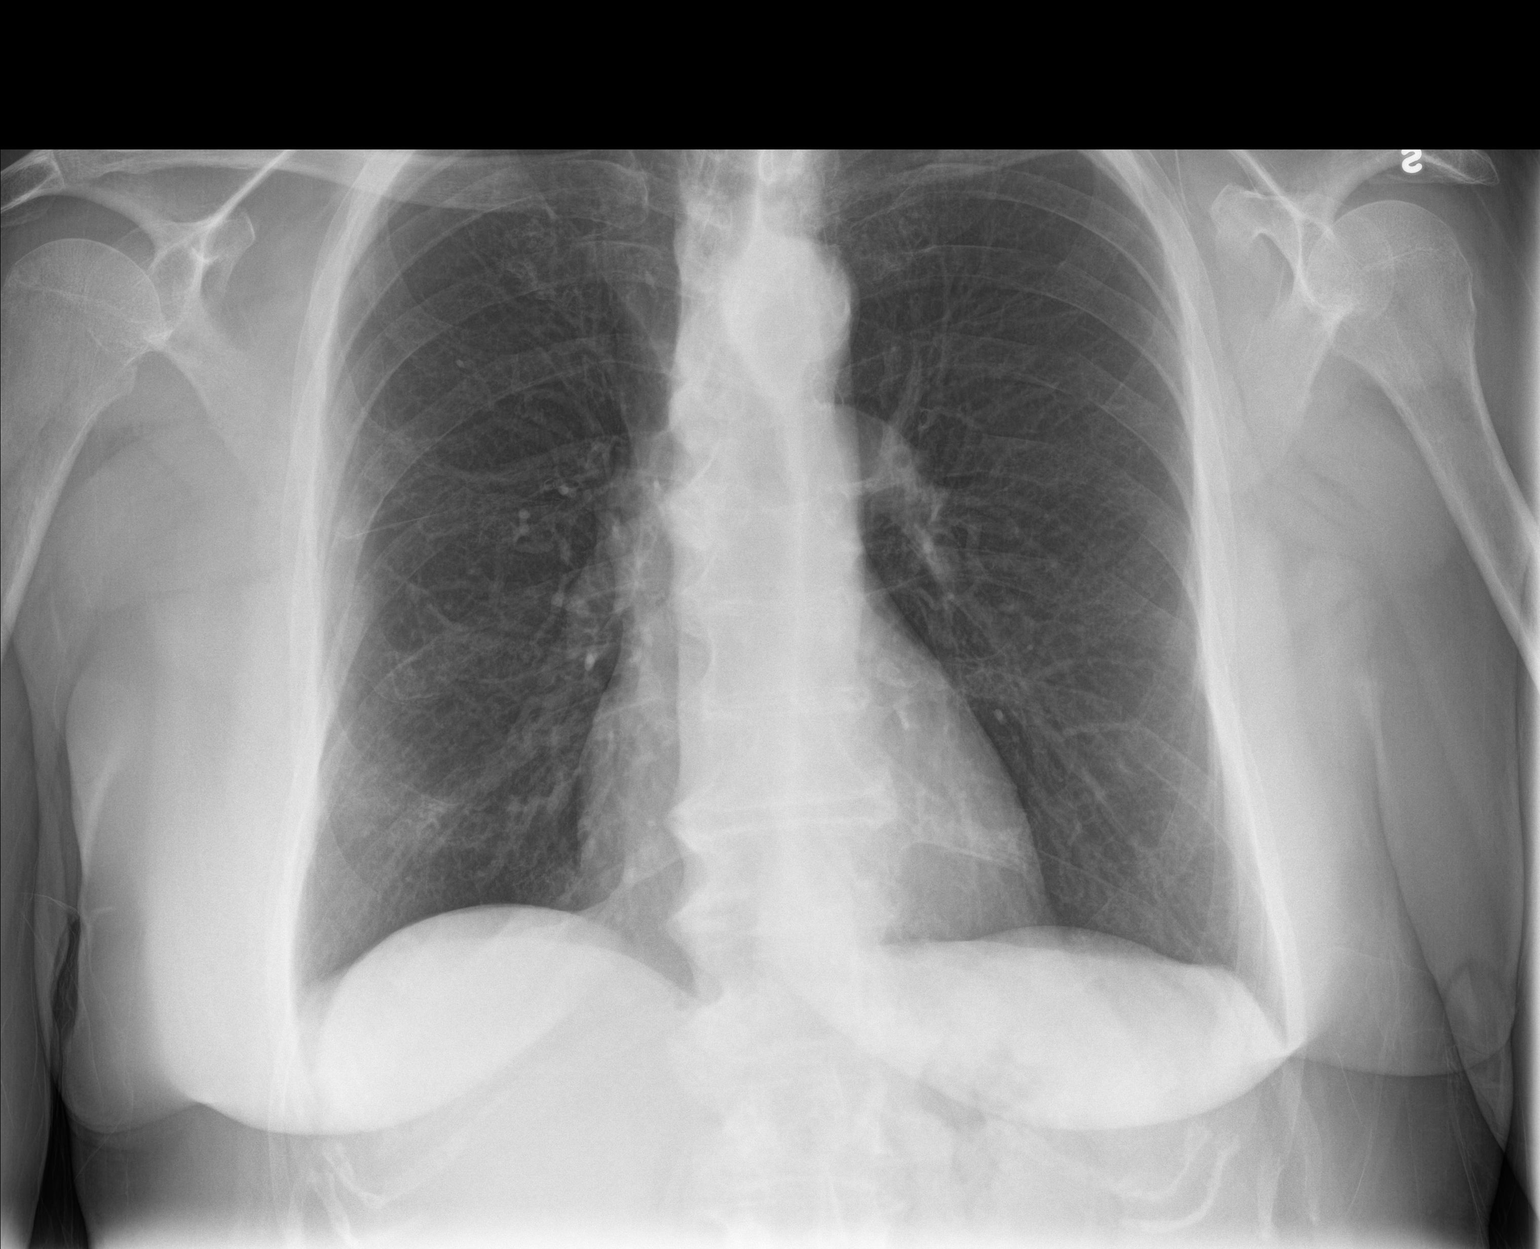

[Series 2: chest lat · 0.14mm/px · 2 of 2 slices shown]
[im 1/2]
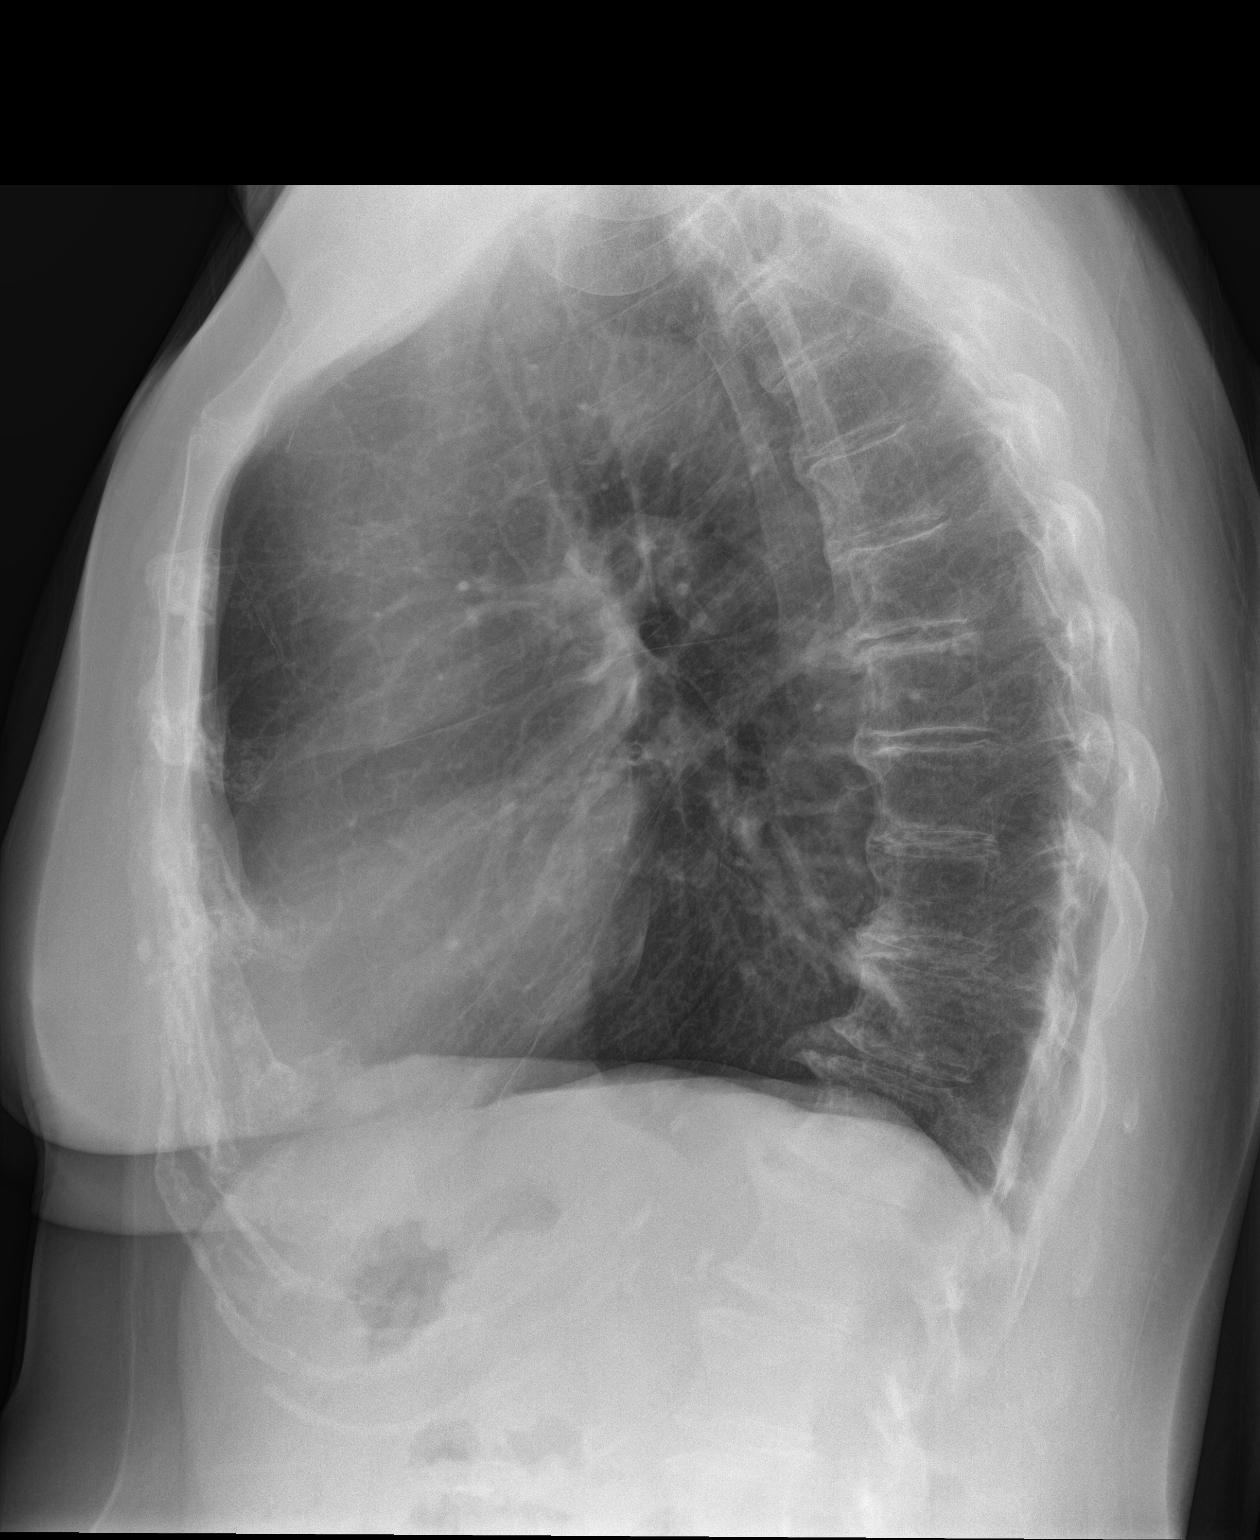
[im 2/2]
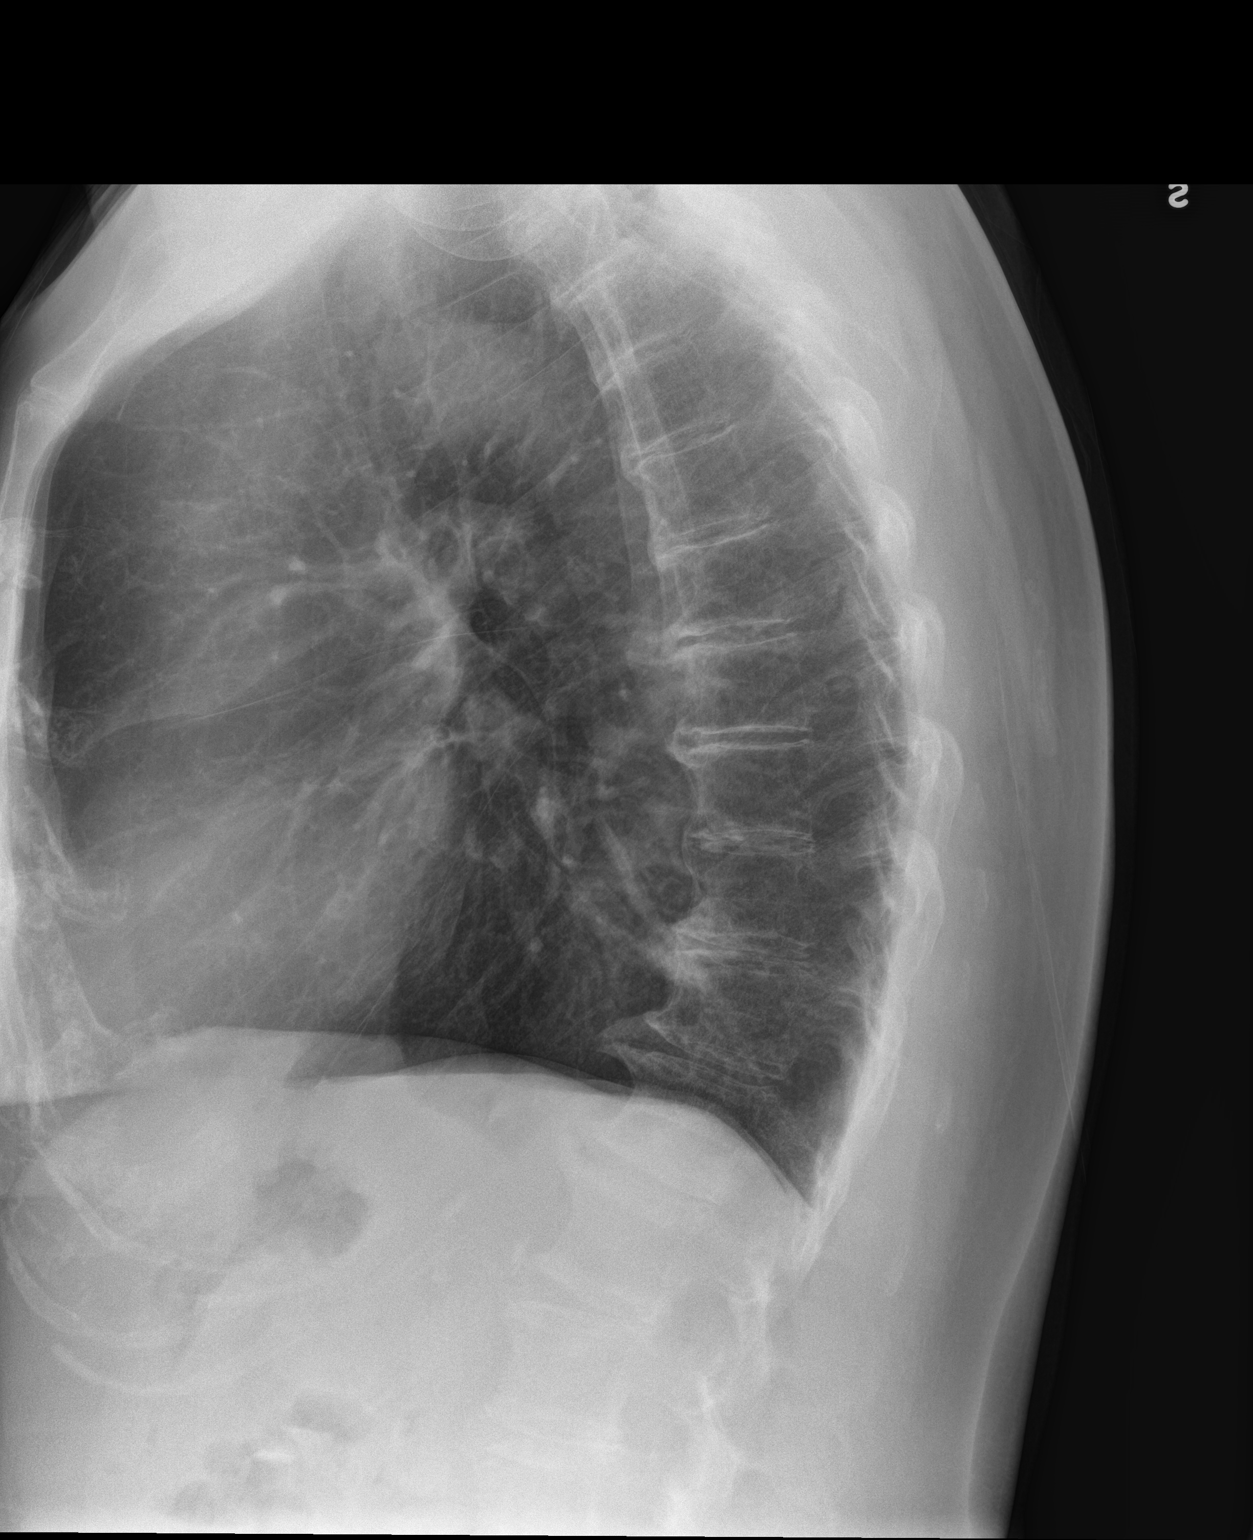

[3 of 3 positions shown; findings below may reference images not displayed]

FINDINGS: The heart size and mediastinal contours are within normal limits.
Atherosclerotic calcification of the aortic knob. Mildly
hyperinflated lungs. Near complete resolution of previously seen
bibasilar opacities. Minimal residual interstitial prominence within
the right lung base. No new airspace consolidation. No pleural
effusion or pneumothorax. Degenerative changes throughout the
thoracic spine.
IMPRESSION: Near complete resolution of previously seen bibasilar opacities.

## 2022-03-30 ENCOUNTER — Encounter: Payer: Self-pay | Admitting: Family Medicine

## 2022-03-30 ENCOUNTER — Ambulatory Visit (INDEPENDENT_AMBULATORY_CARE_PROVIDER_SITE_OTHER): Payer: Medicare Other | Admitting: Family Medicine

## 2022-03-30 ENCOUNTER — Ambulatory Visit (INDEPENDENT_AMBULATORY_CARE_PROVIDER_SITE_OTHER): Payer: Medicare Other

## 2022-03-30 VITALS — BP 130/82 | HR 86 | Ht 68.0 in | Wt 168.0 lb

## 2022-03-30 VITALS — BP 130/82

## 2022-03-30 DIAGNOSIS — I10 Essential (primary) hypertension: Secondary | ICD-10-CM

## 2022-03-30 DIAGNOSIS — Z0000 Encounter for general adult medical examination without abnormal findings: Secondary | ICD-10-CM | POA: Diagnosis not present

## 2022-03-30 DIAGNOSIS — Z23 Encounter for immunization: Secondary | ICD-10-CM | POA: Diagnosis not present

## 2022-03-30 MED ORDER — AMLODIPINE BESYLATE 10 MG PO TABS: 10.0000 mg | ORAL_TABLET | Freq: Every day | ORAL | 1 refills | Status: DC

## 2022-03-30 NOTE — Patient Instructions (Signed)

## 2022-03-30 NOTE — Progress Notes (Signed)
Subjective:   Frances Harmon is a 76 y.o. female who presents for Medicare Annual (Subsequent) preventive examination.  Review of Systems    Per HPI unless specifically indicated below.  Cardiac Risk Factors include: advanced age (>88mn, >>61women);female gender         Objective:    Today's Vitals   03/30/22 1433  BP: 130/82   There is no height or weight on file to calculate BMI.     01/10/2022    2:34 PM 03/25/2021   11:47 AM 07/27/2020    9:17 AM 03/02/2020    2:10 PM 03/02/2019    2:15 PM 02/26/2018    1:30 PM 01/21/2017    9:56 AM  Advanced Directives  Does Patient Have a Medical Advance Directive? Yes Yes Yes Yes No Yes Yes  Type of AParamedicof ADuBoisLiving will Living will;Healthcare Power of Attorney Living will HLawteyLiving will  HExcelsior EstatesLiving will HMount CalvaryLiving will  Does patient want to make changes to medical advance directive? No - Patient declined        Copy of [place] Chart? No - copy requested No - copy requested  No - copy requested  No - copy requested No - copy requested  Would patient like information on creating a medical advance directive?  No - Patient declined   Yes (MAU/Ambulatory/Procedural Areas - Information given)      Current Medications (verified) Outpatient Encounter Medications as of 03/30/2022  Medication Sig   Acetaminophen (TYLENOL) 325 MG CAPS PRN   amLODipine (NORVASC) 10 MG tablet Take 1 tablet (10 mg total) by mouth daily.   atorvastatin (LIPITOR) 20 MG tablet Take 1 tablet (20 mg total) by mouth daily.   carvedilol (COREG) 3.125 MG tablet Take 1 tablet (3.125 mg total) by mouth 2 (two) times daily with a meal.   furosemide (LASIX) 40 MG tablet Take 40 mg by mouth every other day.   glipiZIDE (GLUCOTROL XL) 2.5 MG 24 hr tablet Take 1 tablet by mouth every day   metFORMIN (GLUCOPHAGE) 500 MG tablet TAKE (1) TABLET BY  MOUTH TWICE DAILY   omeprazole (PRILOSEC) 20 MG capsule Take 1 capsule by mouth every morning   No facility-administered encounter medications on file as of 03/30/2022.    Allergies (verified) Patient has no known allergies.   History: Past Medical History:  Diagnosis Date   Diabetes mellitus without complication (HCC)    GERD (gastroesophageal reflux disease)    Hyperlipidemia    Hypertension    Past Surgical History:  Procedure Laterality Date   COLONOSCOPY  2009   repeat in 5 years- Dr SBary Leriche  COLONOSCOPY WITH PROPOFOL N/A 09/09/2015   Procedure: COLONOSCOPY WITH PROPOFOL;  Surgeon: PHulen Luster MD;  [facility]  Service: Gastroenterology;  Laterality: N/A;   FOOT SURGERY Bilateral    Bunion removal   INCONTINENCE SURGERY     VAGINAL HYSTERECTOMY     VENTRAL HERNIA REPAIR N/A 05/06/2015   Procedure: HERNIA REPAIR VENTRAL ADULT;  Surgeon: JLeonie Green MD;  Location: [facility] ORS;  Service: General;  Laterality: N/A;   Family History  Problem Relation Age of Onset   Breast cancer Daughter [phone]  Breast cancer Mother [phone]  Hypertension Brother    Diabetes Brother    Arthritis Brother    Social History   Socioeconomic History   Marital status: Married    Spouse  name: Not on file   Number of children: 2   Years of education: Not on file   Highest education level: 12th grade  Occupational History   Occupation: Retired    Comment: works part time  Tobacco Use   Smoking status: Never   Smokeless tobacco: Never   Tobacco comments:    smokin cessation materials not required  Vaping Use   Vaping Use: Never used  Substance and Sexual Activity   Alcohol use: No    Alcohol/week: 0.0 standard drinks of alcohol   Drug use: No   Sexual activity: Not Currently  Other Topics Concern   Not on file  Social History Narrative   Not on file   Social Determinants of Health   Financial Resource Strain: Low Risk  (03/30/2022)   Overall Financial Resource Strain  (CARDIA)    Difficulty of Paying Living Expenses: Not hard at all  Food Insecurity: No Food Insecurity (03/30/2022)   Hunger Vital Sign    Worried About Running Out of Food in the Last Year: Never true    [place] in the Last Year: Never true  Transportation Needs: No Transportation Needs (03/30/2022)   PRAPARE - Hydrologist (Medical): No    Lack of Transportation (Non-Medical): No  Physical Activity: Sufficiently Active (03/25/2021)   Exercise Vital Sign    Days of Exercise per Week: 7 days    Minutes of Exercise per Session: 40 min  Stress: No Stress Concern Present (03/30/2022)   [place]    Feeling of Stress : Not at all  Social Connections: Moderately Integrated (03/30/2022)   Social Connection and Isolation Panel [NHANES]    Frequency of Communication with Friends and Family: Twice a week    Frequency of Social Gatherings with Friends and Family: Never    Attends Religious Services: More than 4 times per year    Active Member of [employer] or Organizations: Yes    Attends Archivist Meetings: Never    Marital Status: Married    Tobacco Counseling Counseling given: Not Answered Tobacco comments: smokin cessation materials not required   Clinical Intake:  Pre-visit preparation completed: No  Pain : No/denies pain     Nutritional Status: BMI 25 -29 Overweight Nutritional Risks: None Diabetes: Yes CBG done?: No CBG resulted in Enter/ Edit results?: No Did pt. bring in CBG monitor from home?: No  How often do you need to have someone help you when you read instructions, pamphlets, or other written materials from your doctor or pharmacy?: 1 - Never  Diabetic?Nutrition Risk Assessment:  Has the patient had any N/V/D within the last 2 months?  No  Does the patient have any non-healing wounds?  No  Has the patient had any unintentional weight loss or weight gain?  No    Diabetes:  Is the patient diabetic?  Yes  If diabetic, was a CBG obtained today?  No  Did the patient bring in their glucometer from home?  No  How often do you monitor your CBG's? 2-3 a week .   Financial Strains and Diabetes Management:  Are you having any financial strains with the device, your supplies or your medication? No .  Does the patient want to be seen by Chronic Care Management for management of their diabetes?  No  Would the patient like to be referred to a Nutritionist or for Diabetic Management?  No  Diabetic Exams:  Diabetic Eye Exam: Overdue for diabetic eye exam. Pt has been advised about the importance in completing this exam. Patient advised to call and schedule an eye exam. Appt scheduled at [facility] on 04/25/22 Diabetic Foot Exam: Completed 11/02/2021    Interpreter Needed?: No  Information entered by :: Donnie Mesa, [place]   Activities of Daily Living    03/30/2022    2:36 PM  In your present state of health, do you have any difficulty performing the following activities:  Hearing? 0  Vision? 0  Difficulty concentrating or making decisions? 0  Walking or climbing stairs? 0  Dressing or bathing? 0  Doing errands, shopping? 0    Patient Care Team: Juline Patch, MD as PCP - General (Family Medicine)  Indicate any recent Medical Services you may have received from other than [facility] providers in the past year (date may be approximate).     Assessment:   This is a routine wellness examination for Lizett.  Hearing/Vision screen No results found.  Dietary issues and exercise activities discussed: Current Exercise Habits: Home exercise routine, Type of exercise: walking, Time (Minutes): 15, Frequency (Times/Week): 1, Weekly Exercise (Minutes/Week): 15, Intensity: Mild[facility], S/P ileostomy.   Goals Addressed   None    Depression Screen    03/30/2022   10:50 AM 02/12/2022    3:10  PM 01/02/2022    2:31 PM 09/01/2021    3:07 PM 05/03/2021   11:58 AM 03/25/2021   11:10 AM 03/07/2021   11:07 AM  PHQ 2/9 Scores  PHQ - 2 Score 0 0 0 0 0 0 0  PHQ- 9 Score 0 2 1 0 0 0 2    Fall Risk    03/30/2022   10:50 AM 02/12/2022    3:10 PM 05/03/2021   11:59 AM 03/25/2021   11:09 AM 03/21/2021   10:29 AM  Fall Risk   Falls in the past year? 0 0 0 0 0  Number falls in past yr: 0 0 0 0 0  Injury with Fall? 0 0 0 0 0  Risk for fall due to : No Fall Risks No Fall Risks No Fall Risks Impaired balance/gait Impaired balance/gait  Follow up Falls evaluation completed Falls evaluation completed Falls evaluation completed Falls evaluation completed Falls evaluation completed    FALL RISK PREVENTION PERTAINING TO THE HOME:  Any stairs in or around the home? No  If so, are there any without handrails? No  Home free of loose throw rugs in walkways, pet beds, electrical cords, etc? Yes  Adequate lighting in your home to reduce risk of falls? Yes   ASSISTIVE DEVICES UTILIZED TO PREVENT FALLS:  Life alert? No  Use of a cane, walker or w/c? No  Grab bars in the bathroom? Yes  Shower chair or bench in shower? Yes  Elevated toilet seat or a handicapped toilet? Yes   TIMED UP AND GO:  Was the test performed?  unable to perform, virtual appt .  Cognitive Function:        03/30/2022    2:37 PM 03/25/2021   11:41 AM 03/02/2020    2:15 PM 03/02/2019    2:19 PM 02/26/2018    1:32 PM  6CIT Screen  What Year? 0 points 0 points 0 points 0 points 0 points  What month? 0 points 0 points 0 points 0 points 0 points  What time? 0 points 0 points  0 points 0 points 0 points  Count back from 20 0 points 0 points 0 points 0 points 0 points  Months in reverse 0 points 0 points 0 points 2 points 0 points  Repeat phrase 0 points 0 points 0 points 0 points 0 points  Total Score 0 points 0 points 0 points 2 points 0 points    Immunizations Immunization History  Administered Date(s) Administered   Fluad  Quad(high Dose 65+) 06/16/2019, 05/19/2020, 05/03/2021, 03/30/2022   Influenza, High Dose Seasonal PF 03/19/2017, 04/09/2018   Influenza,inj,Quad PF,6+ Mos 04/28/2015   Influenza-Unspecified 03/23/2021   Moderna Sars-Covid-2 Vaccination 09/07/2019, 09/28/2019   Pneumococcal Polysaccharide-23 04/10/2013    TDAP status: Up to date  Flu Vaccine status: Due, Education has been provided regarding the importance of this vaccine. Advised may receive this vaccine at local pharmacy or Health Dept. Aware to provide a copy of the vaccination record if obtained from local pharmacy or Health Dept. Verbalized acceptance and understanding.  Pneumococcal vaccine status: Declined,  Education has been provided regarding the importance of this vaccine but patient still declined. Advised may receive this vaccine at local pharmacy or Health Dept. Aware to provide a copy of the vaccination record if obtained from local pharmacy or Health Dept. Verbalized acceptance and understanding.   Covid-19 vaccine status: Declined, Education has been provided regarding the importance of this vaccine but patient still declined. Advised may receive this vaccine at local pharmacy or Health Dept.or vaccine clinic. Aware to provide a copy of the vaccination record if obtained from local pharmacy or Health Dept. Verbalized acceptance and understanding.  Qualifies for Shingles Vaccine? Yes   Zostavax completed No   Shingrix Completed?: No.    Education has been provided regarding the importance of this vaccine. Patient has been advised to call insurance company to determine out of pocket expense if they have not yet received this vaccine. Advised may also receive vaccine at local pharmacy or Health Dept. Verbalized acceptance and understanding.  Screening Tests Health Maintenance  Topic Date Due   HEMOGLOBIN A1C  03/01/2022   OPHTHALMOLOGY EXAM  03/30/2022 (Originally 09/20/2020)   COVID-19 Vaccine (3 - Moderna risk series)  04/15/2022 (Originally 10/26/2019)   Zoster Vaccines- Shingrix (1 of 2) 06/02/2022 (Originally 09/25/1964)   Pneumonia Vaccine 85+ Years old (2 - PCV) 03/31/2023 (Originally 04/10/2014)   MAMMOGRAM  03/31/2023 (Originally 01/10/2021)   URINE MICROALBUMIN  09/01/2022   FOOT EXAM  11/03/2022   TETANUS/TDAP  12/05/2026   COLONOSCOPY (Pts 45-38yr Insurance coverage will need to be confirmed)  10/27/2029   INFLUENZA VACCINE  Completed   DEXA SCAN  Completed   Hepatitis C Screening  Completed   HPV VACCINES  Aged Out    Health Maintenance  Health Maintenance Due  Topic Date Due   HEMOGLOBIN A1C  03/01/2022    Colorectal cancer screening: Type of screening: Colonoscopy. Completed 10/28/2019. Repeat every 10 years  Mammogram status: Completed 01/11/2020. Repeat every year. The pt postpone her mammogram until 03/31/23  DEXA Scan: completed 03/14/2020  Lung Cancer Screening: (Low Dose CT Chest recommended if Age 74-80years, 30 pack-year currently smoking OR have quit w/in 15years.) does not qualify.    Additional Screening:  Hepatitis C Screening: does qualify; Completed 01/21/2017  Vision Screening: Recommended annual ophthalmology exams for early detection of glaucoma and other disorders of the eye. Is the patient up to date with their annual eye exam?  No  Who is the provider or what is the name of the  office in which the patient attends annual eye exams? Scheduled 04/25/2022 w/ Dr. Ellin Mayhew  If pt is not established with a provider, would they like to be referred to a provider to establish care? No .   Dental Screening: Recommended annual dental exams for proper oral hygiene  Community Resource Referral / Chronic Care Management: CRR required this visit?  No   CCM required this visit?  No      Plan:     I have personally reviewed and noted the following in the patient's chart:   Medical and social history Use of alcohol, tobacco or illicit drugs  Current medications and  supplements including opioid prescriptions. Patient is not currently taking opioid prescriptions. Functional ability and status Nutritional status Physical activity Advanced directives List of other physicians Hospitalizations, surgeries, and ER visits in previous 12 months Vitals Screenings to include cognitive, depression, and falls Referrals and appointments  In addition, I have reviewed and discussed with patient certain preventive protocols, quality metrics, and best practice recommendations. A written personalized care plan for preventive services as well as general preventive health recommendations were provided to patient.    Ms. Pinette , Thank you for taking time to come for your Medicare Wellness Visit. I appreciate your ongoing commitment to your health goals. Please review the following plan we discussed and let me know if I can assist you in the future.   These are the goals we discussed:  Goals      DIET - INCREASE WATER INTAKE     Recommend to drink at least 6-8 8oz glasses of water per day.     Weight (lb) < 200 lb (90.7 kg)     Pt states she would like to lose 10-15 lbs over the next year.         This is a list of the screening recommended for you and due dates:  Health Maintenance  Topic Date Due   Hemoglobin A1C  03/01/2022   Eye exam for diabetics  03/30/2022*   COVID-19 Vaccine (3 - Moderna risk series) 04/15/2022*   Zoster (Shingles) Vaccine (1 of 2) 06/02/2022*   Pneumonia Vaccine (2 - PCV) 03/31/2023*   Mammogram  03/31/2023*   Urine Protein Check  09/01/2022   Complete foot exam   11/03/2022   Tetanus Vaccine  12/05/2026   Colon Cancer Screening  10/27/2029   Flu Shot  Completed   DEXA scan (bone density measurement)  Completed   Hepatitis C Screening: USPSTF Recommendation to screen - Ages 18-79 yo.  Completed   HPV Vaccine  Aged Out  *Topic was postponed. The date shown is not the original due date.     Wilson Singer, [place]   03/30/2022    Nurse Notes: Approximately 30 minute Non-Face-to-Face Visit

## 2022-03-30 NOTE — Progress Notes (Signed)
Date:  03/30/2022   Name:  Frances Harmon   DOB:  12-30-1945   MRN:  [id number]   Chief Complaint: Hypertension (Increased Amlodipine to 25m) and Flu Vaccine  Hypertension This is a chronic problem. The current episode started more than 1 year ago. The problem has been gradually improving since onset. The problem is controlled. Pertinent negatives include no chest pain, headaches, palpitations, peripheral edema, PND or shortness of breath. Past treatments include calcium channel blockers, beta blockers, alpha 1 blockers and diuretics. The current treatment provides no improvement. There are no compliance problems.  There is no history of angina, kidney disease, CAD/MI, CVA, heart failure, left ventricular hypertrophy, PVD or retinopathy. There is no history of chronic renal disease, a hypertension causing med or renovascular disease.    Lab Results  Component Value Date   NA 145 (H) 02/12/2022   K 4.8 02/12/2022   CO2 23 02/12/2022   GLUCOSE 92 02/12/2022   BUN 21 02/12/2022   CREATININE 1.43 (H) 02/12/2022   CALCIUM 9.5 02/12/2022   EGFR 38 (L) 02/12/2022   GFRNONAA >60 07/27/2020   Lab Results  Component Value Date   CHOL 128 09/01/2021   HDL 59 09/01/2021   LDLCALC 53 09/01/2021   TRIG 82 09/01/2021   CHOLHDL 2.4 09/12/2017   No results found for: "TSH" Lab Results  Component Value Date   HGBA1C 5.1 09/01/2021   Lab Results  Component Value Date   WBC 5.9 07/27/2020   HGB 12.8 07/27/2020   HCT 38.9 07/27/2020   MCV 88.0 07/27/2020   PLT 162 07/27/2020   Lab Results  Component Value Date   ALT 11 10/22/2019   AST 13 10/22/2019   ALKPHOS 90 10/22/2019   BILITOT 0.5 10/22/2019   No results found for: "25OHVITD2", "25OHVITD3", "VD25OH"   Review of Systems  Respiratory:  Negative for cough, chest tightness, shortness of breath and wheezing.   Cardiovascular:  Negative for chest pain, palpitations, leg swelling and PND.  Gastrointestinal:  Negative for  abdominal pain and constipation.  Endocrine: Negative for polydipsia and polyuria.  Neurological:  Negative for headaches.    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   Acute respiratory failure with hypoxia (HTru[place] 11/05/2020   H/O total hysterectomy with bilateral salpingo-oophorectomy (BSO) 09/06/2020   Thrombocytopenia (HAt[place] 04/23/2019   Type 2 diabetes mellitus with complication, without long-term current use of insulin ([place] 04/28/2015   Essential hypertension 04/28/2015   Hyperlipidemia 04/28/2015   Esophageal reflux 04/28/2015   Vitamin D deficiency 04/28/2015    No Known Allergies  Past Surgical History:  Procedure Laterality Date   COLONOSCOPY  2009   repeat in 5 years- Dr SBary Leriche  COLONOSCOPY WITH PROPOFOL N/A 09/09/2015   Procedure: COLONOSCOPY WITH PROPOFOL;  Surgeon: PHulen Luster MD;  [facility];  Service: Gastroenterology;  Laterality: N/A;   FOOT SURGERY Bilateral    Bunion removal   INCONTINENCE SURGERY     VAGINAL HYSTERECTOMY     VENTRAL HERNIA REPAIR N/A 05/06/2015   Procedure: HERNIA REPAIR VENTRAL ADULT;  Surgeon: JLeonie Green MD;  Location: [facility] ORS;  Service: General;  Laterality: N/A;    Social History   Tobacco Use   Smoking status: Never   Smokeless tobacco: Never   Tobacco comments:    smokin cessation materials not required  Vaping Use   Vaping Use: Never used  Substance Use Topics   Alcohol use: No    Alcohol/week: 0.0 standard drinks of alcohol   Drug use: No     Medication list has been reviewed and updated.  Current Meds  Medication  Sig   Acetaminophen (TYLENOL) 325 MG CAPS PRN   amLODipine (NORVASC) 10 MG tablet Take 1 tablet (10 mg total) by mouth daily.   atorvastatin (LIPITOR) 20 MG tablet Take 1 tablet (20 mg total) by mouth daily.   carvedilol (COREG) 3.125 MG tablet Take 1 tablet (3.125 mg total) by mouth 2 (two) times daily with a meal.   glipiZIDE (GLUCOTROL XL) 2.5 MG 24 hr tablet Take 1 tablet by mouth every day   metFORMIN (GLUCOPHAGE) 500 MG tablet TAKE (1) TABLET BY MOUTH TWICE DAILY   omeprazole (PRILOSEC) 20 MG capsule Take 1 capsule by mouth every morning       03/30/2022   10:50 AM 02/12/2022    3:11 PM 01/02/2022    2:32 PM 09/01/2021    3:07 PM  GAD 7 : Generalized Anxiety Score  Nervous, Anxious, on Edge 0 0 0 0  Control/stop worrying 0 0 0 0  Worry too much - different things 0 0 0 0  Trouble relaxing 0 0 0 0  Restless 0 0 0 0  Easily annoyed or irritable 0 0 0 0  Afraid - awful might happen 0 0 0 0  Total GAD 7 Score 0 0 0 0  Anxiety Difficulty Not difficult at all Not difficult at all Not difficult at all Not difficult at all       03/30/2022   10:50 AM 02/12/2022    3:10 PM 01/02/2022    2:31 PM  Depression screen PHQ 2/9  Decreased Interest 0 0 0  Down, Depressed, Hopeless 0 0 0  PHQ - 2 Score 0 0 0  Altered sleeping 0 1 1  Tired, decreased energy 0 1 0  Change in appetite 0 0 0  Feeling bad or failure about yourself  0 0 0  Trouble concentrating 0 0 0  Moving slowly or fidgety/restless 0 0 0  Suicidal thoughts 0 0 0  PHQ-9 Score 0 2 1  Difficult doing work/chores Not difficult at all Not difficult at all Not difficult at all    BP Readings from Last 3 Encounters:  03/30/22 130/82  03/02/22 (!) 150/82  02/12/22 138/78    Physical Exam Vitals and nursing note reviewed. Exam conducted with a chaperone present.  Constitutional:      General: She is not in acute distress.    Appearance: She is not diaphoretic.  HENT:     Head: Normocephalic and atraumatic.     Right Ear:  External ear normal.     Left Ear: External ear normal.     Nose: Nose normal.  Eyes:     General:        Right eye: No discharge.        Left eye: No discharge.     Conjunctiva/sclera: Conjunctivae normal.     Pupils: Pupils are equal, round, and reactive to light.  Neck:     Thyroid: No thyromegaly.     Vascular: No JVD.  Cardiovascular:  Rate and Rhythm: Normal rate and regular rhythm.     Heart sounds: Normal heart sounds. No murmur heard.    No friction rub. No gallop.  Pulmonary:     Effort: Pulmonary effort is normal.     Breath sounds: Normal breath sounds.  Abdominal:     General: Bowel sounds are normal.     Palpations: Abdomen is soft. There is no mass.     Tenderness: There is no abdominal tenderness. There is no guarding.  Musculoskeletal:        General: Normal range of motion.     Cervical back: Normal range of motion and neck supple.  Lymphadenopathy:     Cervical: No cervical adenopathy.  Skin:    General: Skin is warm and dry.  Neurological:     Mental Status: She is alert.     Cranial Nerves: No cranial nerve deficit.     Sensory: No sensory deficit.     Deep Tendon Reflexes: Reflexes are normal and symmetric.     Wt Readings from Last 3 Encounters:  03/30/22 168 lb (76.2 kg)  03/02/22 165 lb (74.8 kg)  02/12/22 162 lb (73.5 kg)    BP 130/82   Pulse 86   Ht _0  (1.727 m)   Wt 168 lb (76.2 kg)   SpO2 98%   BMI 25.54 kg/m   Assessment and Plan:   1. Primary hypertension Chronic.  Controlled.  Stable.  Blood pressure today 130/82.  Patient returns increasing amlodipine for blood pressure check.  Blood pressure now is in control and will continue current dosing of amlodipine along Coreg.  And Lasix.  Gust and administered - amLODipine (NORVASC) 10 MG tablet; Take 1 tablet (10 mg total) by mouth daily.  Dispense: 90 tablet; Refill: 1  2. Need for immunization against influenza Discussed and administered.    Otilio Miu, MD

## 2022-04-11 ENCOUNTER — Inpatient Hospital Stay: Payer: Medicare Other | Attending: Oncology

## 2022-04-11 ENCOUNTER — Inpatient Hospital Stay: Payer: Medicare Other

## 2022-04-11 ENCOUNTER — Ambulatory Visit: Payer: Medicare Other

## 2022-04-11 ENCOUNTER — Other Ambulatory Visit: Payer: Medicare Other

## 2022-04-11 DIAGNOSIS — D631 Anemia in chronic kidney disease: Secondary | ICD-10-CM | POA: Diagnosis not present

## 2022-04-11 DIAGNOSIS — E538 Deficiency of other specified B group vitamins: Secondary | ICD-10-CM | POA: Insufficient documentation

## 2022-04-11 DIAGNOSIS — D509 Iron deficiency anemia, unspecified: Secondary | ICD-10-CM

## 2022-04-11 DIAGNOSIS — N183 Chronic kidney disease, stage 3 unspecified: Secondary | ICD-10-CM | POA: Insufficient documentation

## 2022-04-11 LAB — CBC WITH DIFFERENTIAL/PLATELET
Abs Immature Granulocytes: 0.01 10*3/uL (ref 0.00–0.07)
Basophils Absolute: 0 10*3/uL (ref 0.0–0.1)
Basophils Relative: 1 %
Eosinophils Absolute: 0.1 10*3/uL (ref 0.0–0.5)
Eosinophils Relative: 1 %
HCT: 34.5 % — ABNORMAL LOW (ref 36.0–46.0)
Hemoglobin: 11.6 g/dL — ABNORMAL LOW (ref 12.0–15.0)
Immature Granulocytes: 0 %
Lymphocytes Relative: 30 %
Lymphs Abs: 1.6 10*3/uL (ref 0.7–4.0)
MCH: 30.4 pg (ref 26.0–34.0)
MCHC: 33.6 g/dL (ref 30.0–36.0)
MCV: 90.6 fL (ref 80.0–100.0)
Monocytes Absolute: 0.4 10*3/uL (ref 0.1–1.0)
Monocytes Relative: 7 %
Neutro Abs: 3.3 10*3/uL (ref 1.7–7.7)
Neutrophils Relative %: 61 %
Platelets: 115 10*3/uL — ABNORMAL LOW (ref 150–400)
RBC: 3.81 MIL/uL — ABNORMAL LOW (ref 3.87–5.11)
RDW: 13.3 % (ref 11.5–15.5)
WBC: 5.3 10*3/uL (ref 4.0–10.5)
nRBC: 0 % (ref 0.0–0.2)

## 2022-04-11 LAB — VITAMIN B12: Vitamin B-12: 1010 pg/mL — ABNORMAL HIGH (ref 180–914)

## 2022-04-11 LAB — IRON AND TIBC
Iron: 75 ug/dL (ref 28–170)
Saturation Ratios: 26 % (ref 10.4–31.8)
TIBC: 286 ug/dL (ref 250–450)
UIBC: 211 ug/dL

## 2022-04-11 LAB — FERRITIN: Ferritin: 115 ng/mL (ref 11–307)

## 2022-04-11 MED ORDER — CYANOCOBALAMIN 1000 MCG/ML IJ SOLN
1000.0000 ug | Freq: Once | INTRAMUSCULAR | Status: AC
  Administered 2022-04-11: 1000 ug via INTRAMUSCULAR
  Filled 2022-04-11: qty 1

## 2022-04-12 LAB — KAPPA/LAMBDA LIGHT CHAINS
Kappa free light chain: 61.7 mg/L — ABNORMAL HIGH (ref 3.3–19.4)
Kappa, lambda light chain ratio: 2.01 — ABNORMAL HIGH (ref 0.26–1.65)
Lambda free light chains: 30.7 mg/L — ABNORMAL HIGH (ref 5.7–26.3)

## 2022-04-13 ENCOUNTER — Inpatient Hospital Stay (HOSPITAL_BASED_OUTPATIENT_CLINIC_OR_DEPARTMENT_OTHER): Payer: Medicare Other | Admitting: Oncology

## 2022-04-13 ENCOUNTER — Encounter: Payer: Self-pay | Admitting: Oncology

## 2022-04-13 ENCOUNTER — Ambulatory Visit: Payer: Medicare Other | Admitting: Family Medicine

## 2022-04-13 DIAGNOSIS — N183 Chronic kidney disease, stage 3 unspecified: Secondary | ICD-10-CM

## 2022-04-13 DIAGNOSIS — E538 Deficiency of other specified B group vitamins: Secondary | ICD-10-CM | POA: Diagnosis not present

## 2022-04-13 DIAGNOSIS — D631 Anemia in chronic kidney disease: Secondary | ICD-10-CM

## 2022-04-17 LAB — MULTIPLE MYELOMA PANEL, SERUM
Albumin SerPl Elph-Mcnc: 3.7 g/dL (ref 2.9–4.4)
Albumin/Glob SerPl: 1.2 (ref 0.7–1.7)
Alpha 1: 0.2 g/dL (ref 0.0–0.4)
Alpha2 Glob SerPl Elph-Mcnc: 0.8 g/dL (ref 0.4–1.0)
B-Globulin SerPl Elph-Mcnc: 0.8 g/dL (ref 0.7–1.3)
Gamma Glob SerPl Elph-Mcnc: 1.3 g/dL (ref 0.4–1.8)
Globulin, Total: 3.2 g/dL (ref 2.2–3.9)
IgA: 238 mg/dL (ref 64–422)
IgG (Immunoglobin G), Serum: 1348 mg/dL (ref 586–1602)
IgM (Immunoglobulin M), Srm: 71 mg/dL (ref 26–217)
Total Protein ELP: 6.9 g/dL (ref 6.0–8.5)

## 2022-04-19 ENCOUNTER — Encounter: Payer: Self-pay | Admitting: Oncology

## 2022-04-19 NOTE — Progress Notes (Signed)
Hematology/Oncology Consult note [facility]  Telephone:([phone] [phone]  Patient Care Team: Juline Patch, MD as PCP - General (Family Medicine)   Name of the patient: Frances Harmon  [id number]  [birth year]   Date of visit: 04/19/22  Diagnosis-anemia of chronic kidney disease  Chief complaint/ Reason for visit-routine follow-up of anemia  Heme/Onc history: patient is a 76 year old female with a past medical history significant for,Hypertension hyperlipidemia type 2 diabetes stage III CKD who has been referred for anemia.  Most recent CBC 01/03/2022 showed white cell count of 4.5, H&H of 9.6/28.7 and a platelet count of 104.  Patient's hemoglobin back in October 2022 was normal at 12.2 and then gradually drifting down since then.  Platelets were 183 back in October 2022 and presently 104.  Most recent serum creatinine was 1.2.  Calcium and total protein was normal.  Iron studies showed a low ferritin of 8 on 01/02/2022.  Folate levels were normal at 16.7.B12 levels were low at 134.  B6 was normal.    Interval history-patient reports mild ongoing fatigueBut denies any new complaints at this time.  ECOG PS- 1 Pain scale- 0   Review of systems- Review of Systems  Constitutional:  Positive for malaise/fatigue.      No Known Allergies   Past Medical History:  Diagnosis Date   Diabetes mellitus without complication (HCC)    GERD (gastroesophageal reflux disease)    Hyperlipidemia    Hypertension      Past Surgical History:  Procedure Laterality Date   COLONOSCOPY  2009   repeat in 5 years- Dr Bary Leriche   COLONOSCOPY WITH PROPOFOL N/A 09/09/2015   Procedure: COLONOSCOPY WITH PROPOFOL;  Surgeon: Hulen Luster, MD;  Location: [facility] ENDOSCOPY;  Service: Gastroenterology;  Laterality: N/A;   FOOT SURGERY Bilateral    Bunion removal   INCONTINENCE SURGERY     VAGINAL HYSTERECTOMY     VENTRAL HERNIA REPAIR N/A 05/06/2015   Procedure:  HERNIA REPAIR VENTRAL ADULT;  Surgeon: Leonie Green, MD;  Location: [facility] ORS;  Service: General;  Laterality: N/A;    Social History   Socioeconomic History   Marital status: Married    Spouse name: Not on file   Number of children: 2   Years of education: Not on file   Highest education level: 12th grade  Occupational History   Occupation: Retired    Comment: works part time  Tobacco Use   Smoking status: Never   Smokeless tobacco: Never   Tobacco comments:    smokin cessation materials not required  Vaping Use   Vaping Use: Never used  Substance and Sexual Activity   Alcohol use: No    Alcohol/week: 0.0 standard drinks of alcohol   Drug use: No   Sexual activity: Not Currently  Other Topics Concern   Not on file  Social History Narrative   Not on file   Social Determinants of Health   Financial Resource Strain: Low Risk  (03/30/2022)   Overall Financial Resource Strain (CARDIA)    Difficulty of Paying Living Expenses: Not hard at all  Food Insecurity: No Food Insecurity (03/30/2022)   Hunger Vital Sign    Worried About Running Out of Food in the Last Year: Never true    [place] in the Last Year: Never true  Transportation Needs: No Transportation Needs (03/30/2022)   PRAPARE - Transportation    Lack of Transportation (Medical): No    Lack of  Transportation (Non-Medical): No  Physical Activity: Sufficiently Active (03/25/2021)   Exercise Vital Sign    Days of Exercise per Week: 7 days    Minutes of Exercise per Session: 40 min  Stress: No Stress Concern Present (03/30/2022)   [place]    Feeling of Stress : Not at all  Social Connections: Moderately Integrated (03/30/2022)   Social Connection and Isolation Panel [NHANES]    Frequency of Communication with Friends and Family: Twice a week    Frequency of Social Gatherings with Friends and Family: Never    Attends Religious Services: More than  4 times per year    Active Member of [employer] or Organizations: Yes    Attends Archivist Meetings: Never    Marital Status: Married  Human resources officer Violence: Not At Risk (03/30/2022)   Humiliation, Afraid, Rape, and Kick questionnaire    Fear of Current or Ex-Partner: No    Emotionally Abused: No    Physically Abused: No    Sexually Abused: No    Family History  Problem Relation Age of Onset   Breast cancer Daughter 74   Breast cancer Mother 31   Hypertension Brother    Diabetes Brother    Arthritis Brother      Current Outpatient Medications:    Acetaminophen (TYLENOL) 325 MG CAPS, PRN, Disp: , Rfl:    amLODipine (NORVASC) 10 MG tablet, Take 1 tablet (10 mg total) by mouth daily., Disp: 90 tablet, Rfl: 1   atorvastatin (LIPITOR) 20 MG tablet, Take 1 tablet (20 mg total) by mouth daily., Disp: 90 tablet, Rfl: 1   carvedilol (COREG) 3.125 MG tablet, Take 1 tablet (3.125 mg total) by mouth 2 (two) times daily with a meal., Disp: 60 tablet, Rfl: 3   glipiZIDE (GLUCOTROL XL) 2.5 MG 24 hr tablet, Take 1 tablet by mouth every day, Disp: 90 tablet, Rfl: 1   metFORMIN (GLUCOPHAGE) 500 MG tablet, TAKE (1) TABLET BY MOUTH TWICE DAILY, Disp: 180 tablet, Rfl: 1   omeprazole (PRILOSEC) 20 MG capsule, Take 1 capsule by mouth every morning, Disp: 90 capsule, Rfl: 1   furosemide (LASIX) 40 MG tablet, Take 40 mg by mouth every other day. (Patient not taking: Reported on 04/13/2022), Disp: , Rfl:   Physical exam:  Physical Exam Constitutional:      General: She is not in acute distress. Cardiovascular:     Rate and Rhythm: Normal rate and regular rhythm.     Heart sounds: Normal heart sounds.  Pulmonary:     Effort: Pulmonary effort is normal.     Breath sounds: Normal breath sounds.  Abdominal:     General: Bowel sounds are normal.     Palpations: Abdomen is soft.  Skin:    General: Skin is warm and dry.  Neurological:     Mental Status: She is alert and oriented to person,  place, and time.         Latest Ref Rng & Units 02/12/2022    4:00 PM  CMP  Glucose 70 - 99 mg/dL 92   BUN 8 - 27 mg/dL 21   Creatinine 0.57 - 1.00 mg/dL 1.43   Sodium 134 - 144 mmol/L 145   Potassium 3.5 - 5.2 mmol/L 4.8   Chloride 96 - 106 mmol/L 111   CO2 20 - 29 mmol/L 23   Calcium 8.7 - 10.3 mg/dL 9.5       Latest Ref Rng & Units 04/11/2022  3:20 PM  CBC  WBC 4.0 - 10.5 K/uL 5.3   Hemoglobin 12.0 - 15.0 g/dL 11.6   Hematocrit 36.0 - 46.0 % 34.5   Platelets 150 - 400 K/uL 115     No images are attached to the encounter.  No results found.   Assessment and plan- Patient is a 76 y.o. female here for routineFollow-up of anemia   Patient's hemoglobin is currently at 11.6.  Iron studies show ferritin level of 115 with an iron saturation of 26%.  She therefore does not require any IV iron at this time.  Myeloma panel shows no M protein and serum free light chain ratio is mildly elevated at 2 likely secondary to chronic kidney disease.  I will repeat CBC ferritin and iron studies in 3 in 6 months and see her back in 6 months    Visit Diagnosis 1. Anemia of chronic kidney failure, stage 3 (moderate) (HCC)      Dr. Randa Evens, MD, MPH [facility] [phone] 04/19/2022 12:15 PM

## 2022-04-25 DIAGNOSIS — H02886 Meibomian gland dysfunction of left eye, unspecified eyelid: Secondary | ICD-10-CM | POA: Diagnosis not present

## 2022-04-25 DIAGNOSIS — H02883 Meibomian gland dysfunction of right eye, unspecified eyelid: Secondary | ICD-10-CM | POA: Diagnosis not present

## 2022-04-25 DIAGNOSIS — H35033 Hypertensive retinopathy, bilateral: Secondary | ICD-10-CM | POA: Diagnosis not present

## 2022-04-25 DIAGNOSIS — E119 Type 2 diabetes mellitus without complications: Secondary | ICD-10-CM | POA: Diagnosis not present

## 2022-04-25 DIAGNOSIS — H1045 Other chronic allergic conjunctivitis: Secondary | ICD-10-CM | POA: Diagnosis not present

## 2022-04-25 LAB — HM DIABETES EYE EXAM

## 2022-05-07 DIAGNOSIS — N1832 Chronic kidney disease, stage 3b: Secondary | ICD-10-CM | POA: Diagnosis not present

## 2022-05-07 DIAGNOSIS — E1122 Type 2 diabetes mellitus with diabetic chronic kidney disease: Secondary | ICD-10-CM | POA: Diagnosis not present

## 2022-05-07 DIAGNOSIS — D631 Anemia in chronic kidney disease: Secondary | ICD-10-CM | POA: Diagnosis not present

## 2022-05-07 DIAGNOSIS — N2581 Secondary hyperparathyroidism of renal origin: Secondary | ICD-10-CM | POA: Diagnosis not present

## 2022-05-07 DIAGNOSIS — I10 Essential (primary) hypertension: Secondary | ICD-10-CM | POA: Diagnosis not present

## 2022-05-07 DIAGNOSIS — R600 Localized edema: Secondary | ICD-10-CM | POA: Diagnosis not present

## 2022-05-07 LAB — MICROALBUMIN / CREATININE URINE RATIO: Microalb Creat Ratio: 4

## 2022-05-10 ENCOUNTER — Ambulatory Visit: Payer: Medicare Other | Admitting: Podiatry

## 2022-05-10 ENCOUNTER — Encounter: Payer: Self-pay | Admitting: Podiatry

## 2022-05-10 DIAGNOSIS — D696 Thrombocytopenia, unspecified: Secondary | ICD-10-CM

## 2022-05-10 DIAGNOSIS — E0843 Diabetes mellitus due to underlying condition with diabetic autonomic (poly)neuropathy: Secondary | ICD-10-CM | POA: Diagnosis not present

## 2022-05-10 DIAGNOSIS — M79675 Pain in left toe(s): Secondary | ICD-10-CM

## 2022-05-10 DIAGNOSIS — M79674 Pain in right toe(s): Secondary | ICD-10-CM | POA: Diagnosis not present

## 2022-05-10 DIAGNOSIS — B351 Tinea unguium: Secondary | ICD-10-CM | POA: Diagnosis not present

## 2022-05-10 NOTE — Progress Notes (Signed)
This patient returns to my office for at risk foot care.  This patient requires this care by a professional since this patient will be at risk due to having type 2 diabetes.  This patient is unable to cut nails herself since the patient cannot reach her nails.These nails are painful walking and wearing shoes.  This patient presents for at risk foot care today.  General Appearance  Alert, conversant and in no acute stress.  Vascular  Dorsalis pedis and posterior tibial  pulses are palpable  bilaterally.  Capillary return is within normal limits  bilaterally. Temperature is within normal limits  bilaterally.  Neurologic  Senn-Weinstein monofilament wire test within normal limits  bilaterally. Muscle power within normal limits bilaterally.  Nails Thick disfigured discolored nails with subungual debris  from hallux to fifth toes bilaterally. No evidence of bacterial infection or drainage bilaterally.  Orthopedic  No limitations of motion  feet .  No crepitus or effusions noted.  No bony pathology or digital deformities noted.  Midtarsal  DJD  B/L.  Skin  normotropic skin with no porokeratosis noted bilaterally.  No signs of infections or ulcers noted.     Onychomycosis  Pain in right toes  Pain in left toes  Consent was obtained for treatment procedures.   Mechanical debridement of nails 1-5  bilaterally performed with a nail nipper.  Filed with dremel without incident.    Return office visit  3 months                    Told patient to return for periodic foot care and evaluation due to potential at risk complications.   Finnick Orosz DPM  

## 2022-06-18 DIAGNOSIS — N2889 Other specified disorders of kidney and ureter: Secondary | ICD-10-CM | POA: Diagnosis not present

## 2022-06-18 DIAGNOSIS — Z7722 Contact with and (suspected) exposure to environmental tobacco smoke (acute) (chronic): Secondary | ICD-10-CM | POA: Diagnosis not present

## 2022-06-18 DIAGNOSIS — Z9889 Other specified postprocedural states: Secondary | ICD-10-CM | POA: Diagnosis not present

## 2022-06-18 DIAGNOSIS — Z79899 Other long term (current) drug therapy: Secondary | ICD-10-CM | POA: Diagnosis not present

## 2022-06-18 DIAGNOSIS — Z466 Encounter for fitting and adjustment of urinary device: Secondary | ICD-10-CM | POA: Diagnosis not present

## 2022-06-27 DIAGNOSIS — K5730 Diverticulosis of large intestine without perforation or abscess without bleeding: Secondary | ICD-10-CM | POA: Diagnosis not present

## 2022-06-27 DIAGNOSIS — Z5189 Encounter for other specified aftercare: Secondary | ICD-10-CM | POA: Diagnosis not present

## 2022-06-27 DIAGNOSIS — L988 Other specified disorders of the skin and subcutaneous tissue: Secondary | ICD-10-CM | POA: Diagnosis not present

## 2022-06-27 DIAGNOSIS — Z9889 Other specified postprocedural states: Secondary | ICD-10-CM | POA: Diagnosis not present

## 2022-06-27 DIAGNOSIS — K632 Fistula of intestine: Secondary | ICD-10-CM | POA: Diagnosis not present

## 2022-07-13 ENCOUNTER — Other Ambulatory Visit: Payer: Self-pay

## 2022-07-13 ENCOUNTER — Inpatient Hospital Stay: Payer: Medicare Other | Attending: Oncology

## 2022-07-13 ENCOUNTER — Other Ambulatory Visit: Payer: Self-pay | Admitting: *Deleted

## 2022-07-13 DIAGNOSIS — D509 Iron deficiency anemia, unspecified: Secondary | ICD-10-CM

## 2022-07-13 DIAGNOSIS — D631 Anemia in chronic kidney disease: Secondary | ICD-10-CM

## 2022-07-13 DIAGNOSIS — E538 Deficiency of other specified B group vitamins: Secondary | ICD-10-CM | POA: Diagnosis not present

## 2022-07-13 DIAGNOSIS — N183 Chronic kidney disease, stage 3 unspecified: Secondary | ICD-10-CM | POA: Diagnosis not present

## 2022-07-13 LAB — IRON AND TIBC
Iron: 33 ug/dL (ref 28–170)
Saturation Ratios: 12 % (ref 10.4–31.8)
TIBC: 272 ug/dL (ref 250–450)
UIBC: 239 ug/dL

## 2022-07-13 LAB — CBC
HCT: 34.2 % — ABNORMAL LOW (ref 36.0–46.0)
Hemoglobin: 11.5 g/dL — ABNORMAL LOW (ref 12.0–15.0)
MCH: 30.6 pg (ref 26.0–34.0)
MCHC: 33.6 g/dL (ref 30.0–36.0)
MCV: 91 fL (ref 80.0–100.0)
Platelets: 80 10*3/uL — ABNORMAL LOW (ref 150–400)
RBC: 3.76 MIL/uL — ABNORMAL LOW (ref 3.87–5.11)
RDW: 12.5 % (ref 11.5–15.5)
WBC: 4.3 10*3/uL (ref 4.0–10.5)
nRBC: 0 % (ref 0.0–0.2)

## 2022-07-13 LAB — FERRITIN: Ferritin: 150 ng/mL (ref 11–307)

## 2022-07-30 ENCOUNTER — Encounter: Payer: Self-pay | Admitting: Family Medicine

## 2022-07-30 ENCOUNTER — Ambulatory Visit (INDEPENDENT_AMBULATORY_CARE_PROVIDER_SITE_OTHER): Payer: Medicare Other | Admitting: Family Medicine

## 2022-07-30 VITALS — BP 120/60 | HR 59 | Ht 68.0 in | Wt 165.0 lb

## 2022-07-30 DIAGNOSIS — E119 Type 2 diabetes mellitus without complications: Secondary | ICD-10-CM | POA: Diagnosis not present

## 2022-07-30 MED ORDER — METFORMIN HCL 500 MG PO TABS: ORAL_TABLET | ORAL | 1 refills | Status: DC

## 2022-07-30 MED ORDER — GLIPIZIDE ER 2.5 MG PO TB24: ORAL_TABLET | ORAL | 1 refills | Status: DC

## 2022-07-30 NOTE — Progress Notes (Signed)
Date:  07/30/2022   Name:  Frances Harmon   DOB:  Nov 22, 1945   MRN:  [id number]   Chief Complaint: Diabetes  Diabetes She presents for her follow-up diabetic visit. She has type 2 diabetes mellitus. Her disease course has been stable. There are no hypoglycemic associated symptoms. Pertinent negatives for hypoglycemia include no dizziness, headaches or nervousness/anxiousness. There are no diabetic associated symptoms. Pertinent negatives for diabetes include no chest pain and no polydipsia. There are no hypoglycemic complications. Symptoms are stable. There are no diabetic complications. Current diabetic treatment includes oral agent (dual therapy) (glipizide/metformen). She is following a generally healthy diet. Meal planning includes avoidance of concentrated sweets and carbohydrate counting. She participates in exercise intermittently. An ACE inhibitor/angiotensin II receptor blocker is not being taken.    Lab Results  Component Value Date   NA 145 (H) 02/12/2022   K 4.8 02/12/2022   CO2 23 02/12/2022   GLUCOSE 92 02/12/2022   BUN 21 02/12/2022   CREATININE 1.43 (H) 02/12/2022   CALCIUM 9.5 02/12/2022   EGFR 38 (L) 02/12/2022   GFRNONAA >60 07/27/2020   Lab Results  Component Value Date   CHOL 128 09/01/2021   HDL 59 09/01/2021   LDLCALC 53 09/01/2021   TRIG 82 09/01/2021   CHOLHDL 2.4 09/12/2017   No results found for: "TSH" Lab Results  Component Value Date   HGBA1C 5.1 09/01/2021   Lab Results  Component Value Date   WBC 4.3 07/13/2022   HGB 11.5 (L) 07/13/2022   HCT 34.2 (L) 07/13/2022   MCV 91.0 07/13/2022   PLT 80 (L) 07/13/2022   Lab Results  Component Value Date   ALT 11 10/22/2019   AST 13 10/22/2019   ALKPHOS 90 10/22/2019   BILITOT 0.5 10/22/2019   No results found for: "25OHVITD2", "25OHVITD3", "VD25OH"   Review of Systems  Constitutional:  Negative for chills and fever.  HENT:  Negative for drooling, ear discharge, ear pain and sore throat.    Respiratory:  Negative for cough, shortness of breath and wheezing.   Cardiovascular:  Negative for chest pain, palpitations and leg swelling.  Gastrointestinal:  Negative for abdominal pain, blood in stool, constipation, diarrhea and nausea.  Endocrine: Negative for polydipsia.  Genitourinary:  Negative for dysuria, frequency, hematuria and urgency.  Musculoskeletal:  Negative for back pain, myalgias and neck pain.  Skin:  Negative for rash.  Allergic/Immunologic: Negative for environmental allergies.  Neurological:  Negative for dizziness and headaches.  Hematological:  Does not bruise/bleed easily.  Psychiatric/Behavioral:  Negative for suicidal ideas. The patient is not nervous/anxious.     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   Acute respiratory failure with hypoxia ([place]) 11/05/2020   H/O total hysterectomy with bilateral salpingo-oophorectomy (BSO) 09/06/2020   Thrombocytopenia ([place]) 04/23/2019   Type 2 diabetes mellitus with complication, without long-term current use of insulin ([place]) 04/28/2015   Essential hypertension 04/28/2015   Hyperlipidemia 04/28/2015   Esophageal reflux 04/28/2015   Vitamin D deficiency 04/28/2015    No Known Allergies  Past Surgical History:  Procedure Laterality Date   COLONOSCOPY  2009   repeat  in 5 years- Dr Bary Leriche   COLONOSCOPY WITH PROPOFOL N/A 09/09/2015   Procedure: COLONOSCOPY WITH PROPOFOL;  Surgeon: Hulen Luster, MD;  Location: [facility] ENDOSCOPY;  Service: Gastroenterology;  Laterality: N/A;   FOOT SURGERY Bilateral    Bunion removal   INCONTINENCE SURGERY      VAGINAL HYSTERECTOMY     VENTRAL HERNIA REPAIR N/A 05/06/2015   Procedure: HERNIA REPAIR VENTRAL ADULT;  Surgeon: Leonie Green, MD;  Location: [facility] ORS;  Service: General;  Laterality: N/A;    Social History   Tobacco Use   Smoking status: Never   Smokeless tobacco: Never   Tobacco comments:    smokin cessation materials not required  Vaping Use   Vaping Use: Never used  Substance Use Topics   Alcohol use: No    Alcohol/week: 0.0 standard drinks of alcohol   Drug use: No     Medication list has been reviewed and updated.  Current Meds  Medication Sig   Acetaminophen (TYLENOL) 325 MG CAPS PRN   amLODipine (NORVASC) 10 MG tablet Take 1 tablet (10 mg total) by mouth daily.   atorvastatin (LIPITOR) 20 MG tablet Take 1 tablet (20 mg total) by mouth daily.   carvedilol (COREG) 3.125 MG tablet Take 1 tablet (3.125 mg total) by mouth 2 (two) times daily with a meal.   furosemide (LASIX) 40 MG tablet Take 40 mg by mouth every other day.   glipiZIDE (GLUCOTROL XL) 2.5 MG 24 hr tablet Take 1 tablet by mouth every day   metFORMIN (GLUCOPHAGE) 500 MG tablet TAKE (1) TABLET BY MOUTH TWICE DAILY   omeprazole (PRILOSEC) 20 MG capsule Take 1 capsule by mouth every morning       07/30/2022    2:53 PM 03/30/2022   10:50 AM 02/12/2022    3:11 PM 01/02/2022    2:32 PM  GAD 7 : Generalized Anxiety Score  Nervous, Anxious, on Edge 0 0 0 0  Control/stop worrying 0 0 0 0  Worry too much - different things 0 0 0 0  Trouble relaxing 0 0 0 0  Restless 0 0 0 0  Easily annoyed or irritable 0 0 0 0  Afraid - awful might happen 0 0 0 0  Total GAD 7 Score 0 0 0 0  Anxiety Difficulty Not difficult at all Not difficult at all Not difficult at all Not difficult at all       07/30/2022    2:53 PM 03/30/2022   10:50 AM 02/12/2022    3:10 PM  Depression screen PHQ 2/9  Decreased Interest 0 0 0  Down, Depressed, Hopeless 0 0 0  PHQ - 2 Score 0 0 0  Altered sleeping 0 0 1  Tired, decreased  energy 0 0 1  Change in appetite 0 0 0  Feeling bad or failure about yourself  0 0 0  Trouble concentrating 0 0 0  Moving slowly or fidgety/restless 0 0 0  Suicidal thoughts 0 0 0  PHQ-9 Score 0 0 2  Difficult doing work/chores Not difficult at all Not difficult at all Not difficult at all    BP Readings from Last 3 Encounters:  07/30/22 120/60  03/30/22 130/82  03/30/22 130/82    Physical Exam HENT:     Right Ear: Tympanic membrane, ear canal and external ear normal.     Left Ear: Tympanic membrane, ear canal and external ear normal.     Nose: Nose normal.     Mouth/Throat:  Mouth: Mucous membranes are moist.  Eyes:     Pupils: Pupils are equal, round, and reactive to light.  Cardiovascular:     Rate and Rhythm: Normal rate.     Pulses: Normal pulses.     Heart sounds: No murmur heard.    No friction rub. No gallop.  Pulmonary:     Effort: Pulmonary effort is normal.     Breath sounds: No decreased breath sounds, wheezing, rhonchi or rales.  Abdominal:     Palpations: There is no hepatomegaly or splenomegaly.  Musculoskeletal:     Cervical back: Normal range of motion.     Wt Readings from Last 3 Encounters:  07/30/22 165 lb (74.8 kg)  03/30/22 168 lb (76.2 kg)  03/02/22 165 lb (74.8 kg)    BP 120/60   Pulse (!) 59   Ht '5\' 8"'$  (1.727 m)   Wt 165 lb (74.8 kg)   SpO2 98%   BMI 25.09 kg/m   Assessment and Plan: 1. Type 2 diabetes mellitus without complication, without long-term current use of insulin ([place]) New onset.  Persistent.  Stable.  Continue glipizide 2.5 XL once a day and metformin 500 mg twice a day.  Will check A1c for current status of A1c in control in the meantime patient will continue with medications as directed and will recheck in 4 months - glipiZIDE (GLUCOTROL XL) 2.5 MG 24 hr tablet; Take 1 tablet by mouth every day  Dispense: 90 tablet; Refill: 1 - metFORMIN (GLUCOPHAGE) 500 MG tablet; TAKE (1) TABLET BY MOUTH TWICE DAILY  Dispense: 180  tablet; Refill: 1 - HgB A1c     Otilio Miu, MD

## 2022-07-31 LAB — HEMOGLOBIN A1C
Est. average glucose Bld gHb Est-mCnc: 105 mg/dL
Hgb A1c MFr Bld: 5.3 % (ref 4.8–5.6)

## 2022-08-01 DIAGNOSIS — K632 Fistula of intestine: Secondary | ICD-10-CM | POA: Diagnosis not present

## 2022-08-16 ENCOUNTER — Ambulatory Visit: Payer: Medicare Other | Admitting: Podiatry

## 2022-08-16 ENCOUNTER — Encounter: Payer: Self-pay | Admitting: Podiatry

## 2022-08-16 VITALS — BP 176/74 | HR 63

## 2022-08-16 DIAGNOSIS — M79674 Pain in right toe(s): Secondary | ICD-10-CM

## 2022-08-16 DIAGNOSIS — B351 Tinea unguium: Secondary | ICD-10-CM

## 2022-08-16 DIAGNOSIS — M79675 Pain in left toe(s): Secondary | ICD-10-CM

## 2022-08-16 DIAGNOSIS — E0843 Diabetes mellitus due to underlying condition with diabetic autonomic (poly)neuropathy: Secondary | ICD-10-CM | POA: Diagnosis not present

## 2022-08-16 NOTE — Progress Notes (Signed)
This patient returns to my office for at risk foot care.  This patient requires this care by a professional since this patient will be at risk due to having type 2 diabetes.  This patient is unable to cut nails herself since the patient cannot reach her nails.These nails are painful walking and wearing shoes.  This patient presents for at risk foot care today.  General Appearance  Alert, conversant and in no acute stress.  Vascular  Dorsalis pedis and posterior tibial  pulses are palpable  bilaterally.  Capillary return is within normal limits  bilaterally. Temperature is within normal limits  bilaterally.  Neurologic  Senn-Weinstein monofilament wire test within normal limits  bilaterally. Muscle power within normal limits bilaterally.  Nails Thick disfigured discolored nails with subungual debris  from hallux to fifth toes bilaterally. No evidence of bacterial infection or drainage bilaterally.  Orthopedic  No limitations of motion  feet .  No crepitus or effusions noted.  No bony pathology or digital deformities noted.  Midtarsal  DJD  B/L.  Skin  normotropic skin with no porokeratosis noted bilaterally.  No signs of infections or ulcers noted.     Onychomycosis  Pain in right toes  Pain in left toes  Consent was obtained for treatment procedures.   Mechanical debridement of nails 1-5  bilaterally performed with a nail nipper.  Filed with dremel without incident.    Return office visit  3 months                    Told patient to return for periodic foot care and evaluation due to potential at risk complications.   Gardiner Barefoot DPM

## 2022-08-29 DIAGNOSIS — K632 Fistula of intestine: Secondary | ICD-10-CM | POA: Diagnosis not present

## 2022-09-10 ENCOUNTER — Ambulatory Visit (INDEPENDENT_AMBULATORY_CARE_PROVIDER_SITE_OTHER): Payer: Medicare Other | Admitting: Family Medicine

## 2022-09-10 ENCOUNTER — Telehealth: Payer: Self-pay | Admitting: Family Medicine

## 2022-09-10 ENCOUNTER — Encounter: Payer: Self-pay | Admitting: Family Medicine

## 2022-09-10 VITALS — BP 154/92 | HR 97 | Ht 68.0 in | Wt 161.0 lb

## 2022-09-10 DIAGNOSIS — J0100 Acute maxillary sinusitis, unspecified: Secondary | ICD-10-CM

## 2022-09-10 MED ORDER — AMOXICILLIN 500 MG PO CAPS: 500.0000 mg | ORAL_CAPSULE | Freq: Three times a day (TID) | ORAL | 0 refills | Status: AC

## 2022-09-10 NOTE — Progress Notes (Signed)
Date:  09/10/2022   Name:  Frances Harmon   DOB:  Sep 29, 1945   MRN:  [id number]   Chief Complaint: Cough (Started Friday with running nose. Sinus pressure across eyes, and nose. No fever or SOB. )  Sinusitis This is a new problem. The current episode started in the past 7 days (Saturday). The problem is unchanged. There has been no fever. Associated symptoms include congestion, coughing, sinus pressure, sneezing and a sore throat. Pertinent negatives include no chills, diaphoresis, ear pain, headaches, shortness of breath or swollen glands. Treatments tried: mucinex. The treatment provided mild relief.    Lab Results  Component Value Date   NA 145 (H) 02/12/2022   K 4.8 02/12/2022   CO2 23 02/12/2022   GLUCOSE 92 02/12/2022   BUN 21 02/12/2022   CREATININE 1.43 (H) 02/12/2022   CALCIUM 9.5 02/12/2022   EGFR 38 (L) 02/12/2022   GFRNONAA >60 07/27/2020   Lab Results  Component Value Date   CHOL 128 09/01/2021   HDL 59 09/01/2021   LDLCALC 53 09/01/2021   TRIG 82 09/01/2021   CHOLHDL 2.4 09/12/2017   No results found for: "TSH" Lab Results  Component Value Date   HGBA1C 5.3 07/30/2022   Lab Results  Component Value Date   WBC 4.3 07/13/2022   HGB 11.5 (L) 07/13/2022   HCT 34.2 (L) 07/13/2022   MCV 91.0 07/13/2022   PLT 80 (L) 07/13/2022   Lab Results  Component Value Date   ALT 11 10/22/2019   AST 13 10/22/2019   ALKPHOS 90 10/22/2019   BILITOT 0.5 10/22/2019   No results found for: "25OHVITD2", "25OHVITD3", "VD25OH"   Review of Systems  Constitutional: Negative.  Negative for chills, diaphoresis, fatigue, fever and unexpected weight change.  HENT:  Positive for congestion, sinus pressure, sneezing and sore throat. Negative for ear discharge, ear pain and rhinorrhea.   Respiratory:  Positive for cough. Negative for shortness of breath, wheezing and stridor.   Gastrointestinal:  Negative for abdominal pain, blood in stool, constipation, diarrhea and nausea.   Genitourinary:  Negative for dysuria, flank pain, frequency, hematuria, urgency and vaginal discharge.  Musculoskeletal:  Negative for arthralgias, back pain and myalgias.  Skin:  Negative for rash.  Neurological:  Negative for dizziness, weakness and headaches.  Hematological:  Negative for adenopathy. Does not bruise/bleed easily.  Psychiatric/Behavioral:  Negative for dysphoric mood. The patient is not nervous/anxious.     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   Acute respiratory failure with hypoxia ([place]) 11/05/2020   H/O total hysterectomy with bilateral salpingo-oophorectomy (BSO) 09/06/2020   Thrombocytopenia ([place]) 04/23/2019   Type 2 diabetes mellitus with complication, without long-term current use of insulin ([place]) 04/28/2015   Essential hypertension 04/28/2015   Hyperlipidemia 04/28/2015   Esophageal reflux 04/28/2015   Vitamin D deficiency 04/28/2015    No Known Allergies  Past Surgical History:  Procedure Laterality Date   COLONOSCOPY  2009   repeat in 5 years- Dr Bary Leriche   COLONOSCOPY WITH PROPOFOL N/A 09/09/2015   Procedure: COLONOSCOPY WITH PROPOFOL;  Surgeon: Hulen Luster, MD;  Location: [facility] ENDOSCOPY;  Service:  Gastroenterology;  Laterality: N/A;   FOOT SURGERY Bilateral    Bunion removal   INCONTINENCE SURGERY     VAGINAL HYSTERECTOMY     VENTRAL HERNIA REPAIR N/A 05/06/2015   Procedure: HERNIA REPAIR VENTRAL ADULT;  Surgeon: Leonie Green, MD;  Location: [facility] ORS;  Service: General;  Laterality: N/A;    Social History   Tobacco Use   Smoking status: Never   Smokeless  tobacco: Never   Tobacco comments:    smokin cessation materials not required  Vaping Use   Vaping Use: Never used  Substance Use Topics   Alcohol use: No    Alcohol/week: 0.0 standard drinks of alcohol   Drug use: No     Medication list has been reviewed and updated.  Current Meds  Medication Sig   Acetaminophen (TYLENOL) 325 MG CAPS PRN   amLODipine (NORVASC) 10 MG tablet Take 1 tablet (10 mg total) by mouth daily.   atorvastatin (LIPITOR) 20 MG tablet Take 1 tablet (20 mg total) by mouth daily.   carvedilol (COREG) 3.125 MG tablet Take 1 tablet (3.125 mg total) by mouth 2 (two) times daily with a meal.   furosemide (LASIX) 40 MG tablet Take 40 mg by mouth every other day.   glipiZIDE (GLUCOTROL XL) 2.5 MG 24 hr tablet Take 1 tablet by mouth every day   metFORMIN (GLUCOPHAGE) 500 MG tablet TAKE (1) TABLET BY MOUTH TWICE DAILY   omeprazole (PRILOSEC) 20 MG capsule Take 1 capsule by mouth every morning       09/10/2022    3:55 PM 07/30/2022    2:53 PM 03/30/2022   10:50 AM 02/12/2022    3:11 PM  GAD 7 : Generalized Anxiety Score  Nervous, Anxious, on Edge 0 0 0 0  Control/stop worrying 0 0 0 0  Worry too much - different things 0 0 0 0  Trouble relaxing 0 0 0 0  Restless 0 0 0 0  Easily annoyed or irritable 0 0 0 0  Afraid - awful might happen 0 0 0 0  Total GAD 7 Score 0 0 0 0  Anxiety Difficulty Not difficult at all Not difficult at all Not difficult at all Not difficult at all       09/10/2022    3:55 PM 07/30/2022    2:53 PM 03/30/2022   10:50 AM  Depression screen PHQ 2/9  Decreased Interest 0 0 0  Down, Depressed, Hopeless 0 0 0  PHQ - 2 Score 0 0 0  Altered sleeping 0 0 0  Tired, decreased energy 0 0 0  Change in appetite 0 0 0  Feeling bad or failure about yourself  0 0 0  Trouble concentrating 0 0 0  Moving slowly or fidgety/restless 0 0 0  Suicidal thoughts 0 0 0  PHQ-9 Score 0 0 0  Difficult doing work/chores Not difficult at all Not difficult at all Not  difficult at all    BP Readings from Last 3 Encounters:  09/10/22 (!) 154/92  08/16/22 (!) 176/74  07/30/22 120/60    Physical Exam Vitals and nursing note reviewed. Exam conducted with a chaperone present.  Constitutional:      General: She is not in acute distress.    Appearance: She is not diaphoretic.  HENT:     Head: Normocephalic and atraumatic.     Jaw: There is normal jaw occlusion.     Right Ear: Tympanic membrane, ear canal and external ear normal.  Left Ear: Tympanic membrane, ear canal and external ear normal.     Nose: Nose normal.     Right Turbinates: Swollen.     Left Turbinates: Swollen.     Right Sinus: No maxillary sinus tenderness or frontal sinus tenderness.     Left Sinus: No maxillary sinus tenderness or frontal sinus tenderness.     Mouth/Throat:     Mouth: Mucous membranes are moist.     Pharynx: No posterior oropharyngeal erythema.  Eyes:     General:        Right eye: No discharge.        Left eye: No discharge.     Conjunctiva/sclera: Conjunctivae normal.     Pupils: Pupils are equal, round, and reactive to light.  Neck:     Thyroid: No thyromegaly.     Vascular: No JVD.  Cardiovascular:     Rate and Rhythm: Normal rate and regular rhythm.     Heart sounds: Normal heart sounds, S1 normal and S2 normal. No murmur heard.    No systolic murmur is present.     No diastolic murmur is present.     No friction rub. No gallop. No S3 or S4 sounds.  Pulmonary:     Effort: Pulmonary effort is normal.     Breath sounds: Normal breath sounds. No decreased breath sounds, wheezing, rhonchi or rales.  Abdominal:     General: Bowel sounds are normal.     Palpations: Abdomen is soft. There is no mass.     Tenderness: There is no abdominal tenderness. There is no guarding.  Musculoskeletal:        General: Normal range of motion.     Cervical back: Normal range of motion and neck supple.  Lymphadenopathy:     Cervical: No cervical adenopathy.  Skin:     General: Skin is warm and dry.  Neurological:     Mental Status: She is alert.     Deep Tendon Reflexes: Reflexes are normal and symmetric.     Wt Readings from Last 3 Encounters:  09/10/22 161 lb (73 kg)  07/30/22 165 lb (74.8 kg)  03/30/22 168 lb (76.2 kg)    BP (!) 154/92   Pulse 97   Ht 5' 8"$  (1.727 m)   Wt 161 lb (73 kg)   SpO2 97%   BMI 24.48 kg/m   Assessment and Plan: 1. Acute maxillary sinusitis, recurrence not specified New onset.  Persistent.  Stable.  History and examination is consistent with sinusitis most likely involving the ethmoid or sphenoid because this is behind the eyes.  Continue with over-the-counter preparations and we will add amoxicillin 500 mg 3 times a day for 10 days. - amoxicillin (AMOXIL) 500 MG capsule; Take 1 capsule (500 mg total) by mouth 3 (three) times daily for 10 days.  Dispense: 30 capsule; Refill: 0     Otilio Miu, MD

## 2022-09-10 NOTE — Telephone Encounter (Signed)
Copied from [employer] [id number]. Topic: General - Other >> Sep 10, 2022 10:54 AM Chapman Fitch wrote: Reason for CRM: Pt needs approval from Dr. Ronnald Ramp for surgery / Pt asked if Baxter Flattery can give her a call/ please advise

## 2022-09-11 ENCOUNTER — Other Ambulatory Visit: Payer: Self-pay | Admitting: Family Medicine

## 2022-09-11 DIAGNOSIS — E119 Type 2 diabetes mellitus without complications: Secondary | ICD-10-CM

## 2022-09-12 ENCOUNTER — Telehealth: Payer: Self-pay

## 2022-09-12 NOTE — Telephone Encounter (Signed)
Called Frances Harmon's office again and left another message for them to return call. Pt is saying they need surgical clearance, but I have not received paperwork indicating so nor do we know what to do as far as labs, EKG, etc.

## 2022-09-13 DIAGNOSIS — N1832 Chronic kidney disease, stage 3b: Secondary | ICD-10-CM | POA: Diagnosis not present

## 2022-09-13 DIAGNOSIS — I10 Essential (primary) hypertension: Secondary | ICD-10-CM | POA: Diagnosis not present

## 2022-09-13 DIAGNOSIS — N2581 Secondary hyperparathyroidism of renal origin: Secondary | ICD-10-CM | POA: Diagnosis not present

## 2022-09-13 DIAGNOSIS — D631 Anemia in chronic kidney disease: Secondary | ICD-10-CM | POA: Diagnosis not present

## 2022-09-13 DIAGNOSIS — E1122 Type 2 diabetes mellitus with diabetic chronic kidney disease: Secondary | ICD-10-CM | POA: Diagnosis not present

## 2022-09-14 ENCOUNTER — Telehealth: Payer: Self-pay | Admitting: Family Medicine

## 2022-09-14 NOTE — Telephone Encounter (Signed)
Copied from [place] [id number]. Topic: General - Other >> Sep 14, 2022  8:43 AM Chapman Fitch wrote: Reason for CRM: Pt needs a letter to return to school since staying out for her sinus infection / note needs to be for the whole week this week /pt wants to pick up today / pt asked for nurse to give her a call / please advise

## 2022-09-14 NOTE — Telephone Encounter (Signed)
Called pt could not leave VM. VM not set up. Pt was seen on 2/19 for sinus but we did not take the pt out of work. Pt can get a note for work but for 2 days only. Noted will be at the front desk for pick up.  KP

## 2022-09-21 ENCOUNTER — Other Ambulatory Visit: Payer: Self-pay | Admitting: Family Medicine

## 2022-09-21 DIAGNOSIS — I10 Essential (primary) hypertension: Secondary | ICD-10-CM

## 2022-09-21 DIAGNOSIS — K632 Fistula of intestine: Secondary | ICD-10-CM | POA: Diagnosis not present

## 2022-09-24 NOTE — Telephone Encounter (Signed)
Requested medications are due for refill today.  yes  Requested medications are on the active medications list.  yes  Last refill. 03/02/2022 #60 3 rf  Future visit scheduled.   yes  Notes to clinic.  Labs are expired.    Requested Prescriptions  Pending Prescriptions Disp Refills   carvedilol (COREG) 3.125 MG tablet [Pharmacy Med Name: CARVEDILOL 3.125 MG TAB] 60 tablet 3    Sig: TAKE (1) TABLET BY MOUTH TWICE DAILY WITH A MEAL     Cardiovascular: Beta Blockers 3 Failed - 09/21/2022 11:53 AM      Failed - Cr in normal range and within 360 days    Creatinine  Date Value Ref Range Status  08/06/2013 0.87 0.60 - 1.30 mg/dL Final   Creatinine, Ser  Date Value Ref Range Status  02/12/2022 1.43 (H) 0.57 - 1.00 mg/dL Final         Failed - AST in normal range and within 360 days    AST  Date Value Ref Range Status  10/22/2019 13 0 - 40 IU/L Final   SGOT(AST)  Date Value Ref Range Status  08/06/2013 19 15 - 37 Unit/L Final         Failed - ALT in normal range and within 360 days    ALT  Date Value Ref Range Status  10/22/2019 11 0 - 32 IU/L Final   SGPT (ALT)  Date Value Ref Range Status  08/06/2013 34 12 - 78 U/L Final         Failed - Last BP in normal range    BP Readings from Last 1 Encounters:  09/10/22 (!) 154/92         Passed - Last Heart Rate in normal range    Pulse Readings from Last 1 Encounters:  09/10/22 97         Passed - Valid encounter within last 6 months    Recent Outpatient Visits           2 weeks ago Acute maxillary sinusitis, recurrence not specified   [facility] [place], Deanna C, MD   1 month ago Type 2 diabetes mellitus without complication, without long-term current use of insulin ([place])   [place] [place], Deanna C, MD   5 months ago Primary hypertension   [facility] [place], [place], MD   6 months ago Primary hypertension   [place] [place], [place], MD   7 months ago Essential hypertension   [place] [facility] [place], [place], MD       Future Appointments             In 2 months Juline Patch, MD [facility] at [facility], [facility]

## 2022-09-25 ENCOUNTER — Telehealth: Payer: Self-pay | Admitting: Family Medicine

## 2022-09-25 NOTE — Telephone Encounter (Signed)
Copied from [place] [id number]. Topic: General - Other >> Sep 25, 2022  9:57 AM Frances Harmon wrote: Reason for CRM: The patient has called requesting to speak with T. Lynch when possible regarding an upcoming procedure  Please contact the patient further when possible

## 2022-10-03 ENCOUNTER — Other Ambulatory Visit: Payer: Self-pay | Admitting: Family Medicine

## 2022-10-03 DIAGNOSIS — E7801 Familial hypercholesterolemia: Secondary | ICD-10-CM

## 2022-10-03 DIAGNOSIS — Z01818 Encounter for other preprocedural examination: Secondary | ICD-10-CM | POA: Insufficient documentation

## 2022-10-03 DIAGNOSIS — K632 Fistula of intestine: Secondary | ICD-10-CM | POA: Insufficient documentation

## 2022-10-04 DIAGNOSIS — K219 Gastro-esophageal reflux disease without esophagitis: Secondary | ICD-10-CM | POA: Diagnosis not present

## 2022-10-04 DIAGNOSIS — N183 Chronic kidney disease, stage 3 unspecified: Secondary | ICD-10-CM | POA: Diagnosis not present

## 2022-10-04 DIAGNOSIS — K56609 Unspecified intestinal obstruction, unspecified as to partial versus complete obstruction: Secondary | ICD-10-CM | POA: Diagnosis not present

## 2022-10-04 DIAGNOSIS — E118 Type 2 diabetes mellitus with unspecified complications: Secondary | ICD-10-CM | POA: Diagnosis not present

## 2022-10-04 DIAGNOSIS — R600 Localized edema: Secondary | ICD-10-CM | POA: Diagnosis not present

## 2022-10-04 DIAGNOSIS — N189 Chronic kidney disease, unspecified: Secondary | ICD-10-CM | POA: Diagnosis not present

## 2022-10-04 DIAGNOSIS — Z932 Ileostomy status: Secondary | ICD-10-CM | POA: Diagnosis not present

## 2022-10-04 DIAGNOSIS — E785 Hyperlipidemia, unspecified: Secondary | ICD-10-CM | POA: Diagnosis not present

## 2022-10-04 DIAGNOSIS — Z8719 Personal history of other diseases of the digestive system: Secondary | ICD-10-CM | POA: Diagnosis not present

## 2022-10-04 DIAGNOSIS — D638 Anemia in other chronic diseases classified elsewhere: Secondary | ICD-10-CM | POA: Diagnosis not present

## 2022-10-04 DIAGNOSIS — K632 Fistula of intestine: Secondary | ICD-10-CM | POA: Diagnosis not present

## 2022-10-04 DIAGNOSIS — Z01818 Encounter for other preprocedural examination: Secondary | ICD-10-CM | POA: Diagnosis not present

## 2022-10-04 DIAGNOSIS — I10 Essential (primary) hypertension: Secondary | ICD-10-CM | POA: Diagnosis not present

## 2022-10-04 NOTE — Telephone Encounter (Signed)
Requested Prescriptions  Pending Prescriptions Disp Refills   atorvastatin (LIPITOR) 20 MG tablet [Pharmacy Med Name: ATORVASTATIN TAB '20MG'$ ] 90 tablet 0    Sig: TAKE (1) TABLET BY MOUTH EVERY DAY     Cardiovascular:  Antilipid - Statins Failed - 10/03/2022  1:51 PM      Failed - Lipid Panel in normal range within the last 12 months    Cholesterol, Total  Date Value Ref Range Status  09/01/2021 128 100 - 199 mg/dL Final   LDL Chol Calc (NIH)  Date Value Ref Range Status  09/01/2021 53 0 - 99 mg/dL Final   HDL  Date Value Ref Range Status  09/01/2021 59 >39 mg/dL Final   Triglycerides  Date Value Ref Range Status  09/01/2021 82 0 - 149 mg/dL Final         Passed - Patient is not pregnant      Passed - Valid encounter within last 12 months    Recent Outpatient Visits           3 weeks ago Acute maxillary sinusitis, recurrence not specified   [facility] at [place], [place], MD   2 months ago Type 2 diabetes mellitus without complication, without long-term current use of insulin ([place])   [place] [place], [place], MD   6 months ago Primary hypertension   [place] [facility] [place], [place], MD   7 months ago Primary hypertension   [place] [facility] [place], [place], MD   7 months ago Essential hypertension   [place] [facility] [place], Deanna C, MD       Future Appointments             In 1 month Juline Patch, MD [place] at [facility], [facility]

## 2022-10-07 DIAGNOSIS — Z8719 Personal history of other diseases of the digestive system: Secondary | ICD-10-CM | POA: Insufficient documentation

## 2022-10-17 ENCOUNTER — Encounter: Payer: Self-pay | Admitting: Oncology

## 2022-10-17 ENCOUNTER — Inpatient Hospital Stay: Payer: Medicare Other | Attending: Oncology | Admitting: Oncology

## 2022-10-17 ENCOUNTER — Other Ambulatory Visit: Payer: Self-pay | Admitting: *Deleted

## 2022-10-17 ENCOUNTER — Other Ambulatory Visit: Payer: Self-pay

## 2022-10-17 ENCOUNTER — Inpatient Hospital Stay: Payer: Medicare Other

## 2022-10-17 VITALS — BP 133/66 | HR 56 | Temp 96.9°F | Resp 18 | Ht 66.0 in | Wt 164.3 lb

## 2022-10-17 DIAGNOSIS — D631 Anemia in chronic kidney disease: Secondary | ICD-10-CM | POA: Diagnosis not present

## 2022-10-17 DIAGNOSIS — E1122 Type 2 diabetes mellitus with diabetic chronic kidney disease: Secondary | ICD-10-CM | POA: Insufficient documentation

## 2022-10-17 DIAGNOSIS — N183 Chronic kidney disease, stage 3 unspecified: Secondary | ICD-10-CM | POA: Diagnosis not present

## 2022-10-17 DIAGNOSIS — Z79899 Other long term (current) drug therapy: Secondary | ICD-10-CM | POA: Diagnosis not present

## 2022-10-17 DIAGNOSIS — E785 Hyperlipidemia, unspecified: Secondary | ICD-10-CM | POA: Insufficient documentation

## 2022-10-17 DIAGNOSIS — Z7984 Long term (current) use of oral hypoglycemic drugs: Secondary | ICD-10-CM | POA: Insufficient documentation

## 2022-10-17 DIAGNOSIS — K219 Gastro-esophageal reflux disease without esophagitis: Secondary | ICD-10-CM | POA: Insufficient documentation

## 2022-10-17 DIAGNOSIS — D649 Anemia, unspecified: Secondary | ICD-10-CM

## 2022-10-17 DIAGNOSIS — D509 Iron deficiency anemia, unspecified: Secondary | ICD-10-CM

## 2022-10-17 DIAGNOSIS — I129 Hypertensive chronic kidney disease with stage 1 through stage 4 chronic kidney disease, or unspecified chronic kidney disease: Secondary | ICD-10-CM | POA: Insufficient documentation

## 2022-10-17 LAB — IRON AND TIBC
Iron: 53 ug/dL (ref 28–170)
Saturation Ratios: 18 % (ref 10.4–31.8)
TIBC: 288 ug/dL (ref 250–450)
UIBC: 235 ug/dL

## 2022-10-17 LAB — CBC
HCT: 34.3 % — ABNORMAL LOW (ref 36.0–46.0)
Hemoglobin: 11.1 g/dL — ABNORMAL LOW (ref 12.0–15.0)
MCH: 29.8 pg (ref 26.0–34.0)
MCHC: 32.4 g/dL (ref 30.0–36.0)
MCV: 92 fL (ref 80.0–100.0)
Platelets: 107 10*3/uL — ABNORMAL LOW (ref 150–400)
RBC: 3.73 MIL/uL — ABNORMAL LOW (ref 3.87–5.11)
RDW: 13 % (ref 11.5–15.5)
WBC: 5.8 10*3/uL (ref 4.0–10.5)
nRBC: 0 % (ref 0.0–0.2)

## 2022-10-17 LAB — FERRITIN: Ferritin: 80 ng/mL (ref 11–307)

## 2022-10-17 NOTE — Addendum Note (Signed)
Addended by: Luella Cook on: 10/17/2022 03:33 PM   Modules accepted: Orders

## 2022-10-17 NOTE — Progress Notes (Signed)
Patient states that she is having surgery on 10/30/22

## 2022-10-17 NOTE — Progress Notes (Signed)
Hematology/Oncology Consult note [facility]  [phone] [phone]  Patient Care Team: Juline Patch, MD as PCP - General (Family Medicine)   Name of the patient: Frances Harmon  [id number]  [birth year]   Date of visit: 10/17/22  Diagnosis-anemia of chronic kidney disease  Chief complaint/ Reason for visit-routine follow-up of anemia  Heme/Onc history: patient is a 77 year old female with a past medical history significant for,Hypertension hyperlipidemia type 2 diabetes stage III CKD who has been referred for anemia.  Most recent CBC 01/03/2022 showed white cell count of 4.5, H&H of 9.6/28.7 and a platelet count of 104.  Patient's hemoglobin back in October 2022 was normal at 12.2 and then gradually drifting down since then.  Platelets were 183 back in October 2022 and presently 104.  Most recent serum creatinine was 1.2.  Calcium and total protein was normal.  Iron studies showed a low ferritin of 8 on 01/02/2022.  Folate levels were normal at 16.7.B12 levels were low at 134.  B6 was normal.   Patient underwent surgery for diverticular stricture and large bowel obstruction in April 2022 at [facility] requiring exploratory laparotomy sigmoidectomy and ileostomy.  She underwent ileostomy revision in October [id number] which was complicated by recurrent enterocutaneous fistula.  Interval history-patient will be undergoing surgical repair of enterocutaneous fistula by Dr. Doyne Keel at [facility] next month.  ECOG PS- 1 Pain scale- 0   Review of systems- Review of Systems  Constitutional:  Negative for chills, fever, malaise/fatigue and weight loss.  HENT:  Negative for congestion, ear discharge and nosebleeds.   Eyes:  Negative for blurred vision.  Respiratory:  Negative for cough, hemoptysis, sputum production, shortness of breath and wheezing.   Cardiovascular:  Negative for chest pain, palpitations, orthopnea and claudication.  Gastrointestinal:  Negative  for abdominal pain, blood in stool, constipation, diarrhea, heartburn, melena, nausea and vomiting.  Genitourinary:  Negative for dysuria, flank pain, frequency, hematuria and urgency.  Musculoskeletal:  Negative for back pain, joint pain and myalgias.  Skin:  Negative for rash.  Neurological:  Negative for dizziness, tingling, focal weakness, seizures, weakness and headaches.  Endo/Heme/Allergies:  Does not bruise/bleed easily.  Psychiatric/Behavioral:  Negative for depression and suicidal ideas. The patient does not have insomnia.       No Known Allergies   Past Medical History:  Diagnosis Date   Diabetes mellitus without complication (HCC)    GERD (gastroesophageal reflux disease)    Hyperlipidemia    Hypertension      Past Surgical History:  Procedure Laterality Date   COLONOSCOPY  2009   repeat in 5 years- Dr Bary Leriche   COLONOSCOPY WITH PROPOFOL N/A 09/09/2015   Procedure: COLONOSCOPY WITH PROPOFOL;  Surgeon: Hulen Luster, MD;  Location: [facility] ENDOSCOPY;  Service: Gastroenterology;  Laterality: N/A;   FOOT SURGERY Bilateral    Bunion removal   INCONTINENCE SURGERY     VAGINAL HYSTERECTOMY     VENTRAL HERNIA REPAIR N/A 05/06/2015   Procedure: HERNIA REPAIR VENTRAL ADULT;  Surgeon: Leonie Green, MD;  Location: [facility] ORS;  Service: General;  Laterality: N/A;    Social History   Socioeconomic History   Marital status: Married    Spouse name: Not on file   Number of children: 2   Years of education: Not on file   Highest education level: 12th grade  Occupational History   Occupation: Retired    Comment: works part time  Tobacco Use   Smoking status: Never  Smokeless tobacco: Never   Tobacco comments:    smokin cessation materials not required  Vaping Use   Vaping Use: Never used  Substance and Sexual Activity   Alcohol use: No    Alcohol/week: 0.0 standard drinks of alcohol   Drug use: No   Sexual activity: Not Currently  Other Topics Concern   Not on  file  Social History Narrative   Not on file   Social Determinants of Health   Financial Resource Strain: Low Risk  (03/30/2022)   Overall Financial Resource Strain (CARDIA)    Difficulty of Paying Living Expenses: Not hard at all  Food Insecurity: No Food Insecurity (03/30/2022)   Hunger Vital Sign    Worried About Running Out of Food in the Last Year: Never true    Ran Out of Food in the Last Year: Never true  Transportation Needs: No Transportation Needs (03/30/2022)   PRAPARE - Hydrologist (Medical): No    Lack of Transportation (Non-Medical): No  Physical Activity: Sufficiently Active (03/25/2021)   Exercise Vital Sign    Days of Exercise per Week: 7 days    Minutes of Exercise per Session: 40 min  Stress: No Stress Concern Present (03/30/2022)   [place]    Feeling of Stress : Not at all  Social Connections: Moderately Integrated (03/30/2022)   Social Connection and Isolation Panel [NHANES]    Frequency of Communication with Friends and Family: Twice a week    Frequency of Social Gatherings with Friends and Family: Never    Attends Religious Services: More than 4 times per year    Active Member of [employer] or Organizations: Yes    Attends Archivist Meetings: Never    Marital Status: Married  Human resources officer Violence: Not At Risk (03/30/2022)   Humiliation, Afraid, Rape, and Kick questionnaire    Fear of Current or Ex-Partner: No    Emotionally Abused: No    Physically Abused: No    Sexually Abused: No    Family History  Problem Relation Age of Onset   Breast cancer Daughter 60   Breast cancer Mother 31   Hypertension Brother    Diabetes Brother    Arthritis Brother      Current Outpatient Medications:    Acetaminophen (TYLENOL) 325 MG CAPS, PRN, Disp: , Rfl:    amLODipine (NORVASC) 10 MG tablet, Take 1 tablet (10 mg total) by mouth daily., Disp: 90 tablet, Rfl: 1    atorvastatin (LIPITOR) 20 MG tablet, TAKE (1) TABLET BY MOUTH EVERY DAY, Disp: 90 tablet, Rfl: 0   carvedilol (COREG) 3.125 MG tablet, TAKE (1) TABLET BY MOUTH TWICE DAILY WITH A MEAL, Disp: 60 tablet, Rfl: 1   furosemide (LASIX) 40 MG tablet, Take by mouth., Disp: , Rfl:    glipiZIDE (GLUCOTROL XL) 2.5 MG 24 hr tablet, Take 1 tablet by mouth every day, Disp: 90 tablet, Rfl: 1   losartan (COZAAR) 25 MG tablet, Take 25 mg by mouth daily., Disp: , Rfl:    metFORMIN (GLUCOPHAGE) 500 MG tablet, TAKE (1) TABLET BY MOUTH TWICE DAILY, Disp: 180 tablet, Rfl: 0   omeprazole (PRILOSEC) 20 MG capsule, Take 1 capsule by mouth every morning, Disp: 90 capsule, Rfl: 1  Physical exam:  Vitals:   10/17/22 1446  BP: (!) 152/70  Pulse: 61  Resp: 18  Temp: (!) 96.9 F (36.1 C)  TempSrc: Tympanic  SpO2: 100%  Weight: 164  lb 4.8 oz (74.5 kg)  Height: 5\' 6"  (1.676 m)   Physical Exam Cardiovascular:     Rate and Rhythm: Normal rate and regular rhythm.     Heart sounds: Normal heart sounds.  Pulmonary:     Effort: Pulmonary effort is normal.     Breath sounds: Normal breath sounds.  Abdominal:     General: Bowel sounds are normal.     Palpations: Abdomen is soft.  Skin:    General: Skin is warm and dry.  Neurological:     Mental Status: She is alert and oriented to person, place, and time.         Latest Ref Rng & Units 02/12/2022    4:00 PM  CMP  Glucose 70 - 99 mg/dL 92   BUN 8 - 27 mg/dL 21   Creatinine 0.57 - 1.00 mg/dL 1.43   Sodium 134 - 144 mmol/L 145   Potassium 3.5 - 5.2 mmol/L 4.8   Chloride 96 - 106 mmol/L 111   CO2 20 - 29 mmol/L 23   Calcium 8.7 - 10.3 mg/dL 9.5       Latest Ref Rng & Units 07/13/2022    2:18 PM  CBC  WBC 4.0 - 10.5 K/uL 4.3   Hemoglobin 12.0 - 15.0 g/dL 11.5   Hematocrit 36.0 - 46.0 % 34.2   Platelets 150 - 400 K/uL 80     Assessment and plan- Patient is a 77 y.o. female here for routine follow-up of mild normocytic anemia    CBC today shows a  white count of 5.8, H&H of 11.1/34.3 and a platelet count of 107.  Patient's hemoglobin has been stable around 11 for the last 6 months.  Prior to that she was more consistently between 12-13.  The drop from 13-11 has been in the last 2 years when she had bowel surgery for large bowel obstruction.  I therefore suspect an element of anemia of chronic disease.  Iron studies from today are pending.  She has mild CKD.  Myeloma panel in the past has shown no M protein.  Both serum kappa and lambda light chain was elevated with a ratio of 2.01 which I will plan to follow-up again in 6 months time.  CBC ferritin and iron studies in 4 and 8 months and I will see her back in 8 months   Visit Diagnosis 1. Normocytic anemia      Dr. Randa Evens, MD, MPH [facility] at [facility] [id number] 10/17/2022 2:53 PM

## 2022-10-23 ENCOUNTER — Other Ambulatory Visit: Payer: Self-pay | Admitting: Family Medicine

## 2022-10-23 DIAGNOSIS — I10 Essential (primary) hypertension: Secondary | ICD-10-CM

## 2022-10-23 NOTE — Telephone Encounter (Signed)
Requested Prescriptions  Pending Prescriptions Disp Refills   amLODipine (NORVASC) 10 MG tablet [Pharmacy Med Name: AMLODIPINE TAB 10MG ] 90 tablet 1    Sig: TAKE (1) TABLET BY MOUTH EVERY DAY     Cardiovascular: Calcium Channel Blockers 2 Passed - 10/23/2022 10:12 AM      Passed - Last BP in normal range    BP Readings from Last 1 Encounters:  10/17/22 133/66         Passed - Last Heart Rate in normal range    Pulse Readings from Last 1 Encounters:  10/17/22 (!) 90         Passed - Valid encounter within last 6 months    Recent Outpatient Visits           1 month ago Acute maxillary sinusitis, recurrence not specified   [facility], MD   2 months ago Type 2 diabetes mellitus without complication, without long-term current use of insulin ([place])   [place] [facility] [place], [place], MD   6 months ago Primary hypertension    [place] [facility] [place], [place], MD   7 months ago Primary hypertension   [place] [facility] [place], [place], MD   8 months ago Essential hypertension   [place] [facility] [place], [place], MD       Future Appointments             In 1 month Juline Patch, MD [place] [facility], [place]

## 2022-10-30 DIAGNOSIS — Z9189 Other specified personal risk factors, not elsewhere classified: Secondary | ICD-10-CM | POA: Diagnosis not present

## 2022-10-30 DIAGNOSIS — Z7409 Other reduced mobility: Secondary | ICD-10-CM | POA: Diagnosis not present

## 2022-10-30 DIAGNOSIS — D121 Benign neoplasm of appendix: Secondary | ICD-10-CM | POA: Diagnosis not present

## 2022-10-30 DIAGNOSIS — Z932 Ileostomy status: Secondary | ICD-10-CM | POA: Diagnosis not present

## 2022-10-30 DIAGNOSIS — Z9181 History of falling: Secondary | ICD-10-CM | POA: Diagnosis not present

## 2022-10-30 DIAGNOSIS — I10 Essential (primary) hypertension: Secondary | ICD-10-CM | POA: Diagnosis not present

## 2022-10-30 DIAGNOSIS — N1832 Chronic kidney disease, stage 3b: Secondary | ICD-10-CM | POA: Diagnosis not present

## 2022-10-30 DIAGNOSIS — K660 Peritoneal adhesions (postprocedural) (postinfection): Secondary | ICD-10-CM | POA: Diagnosis not present

## 2022-10-30 DIAGNOSIS — E785 Hyperlipidemia, unspecified: Secondary | ICD-10-CM | POA: Diagnosis not present

## 2022-10-30 DIAGNOSIS — K439 Ventral hernia without obstruction or gangrene: Secondary | ICD-10-CM | POA: Diagnosis not present

## 2022-10-30 DIAGNOSIS — K632 Fistula of intestine: Secondary | ICD-10-CM | POA: Insufficient documentation

## 2022-10-30 DIAGNOSIS — T8183XA Persistent postprocedural fistula, initial encounter: Secondary | ICD-10-CM | POA: Diagnosis not present

## 2022-10-30 DIAGNOSIS — G8918 Other acute postprocedural pain: Secondary | ICD-10-CM | POA: Diagnosis not present

## 2022-10-30 DIAGNOSIS — I129 Hypertensive chronic kidney disease with stage 1 through stage 4 chronic kidney disease, or unspecified chronic kidney disease: Secondary | ICD-10-CM | POA: Diagnosis not present

## 2022-10-30 DIAGNOSIS — K219 Gastro-esophageal reflux disease without esophagitis: Secondary | ICD-10-CM | POA: Diagnosis not present

## 2022-10-30 DIAGNOSIS — Z7984 Long term (current) use of oral hypoglycemic drugs: Secondary | ICD-10-CM | POA: Diagnosis not present

## 2022-10-30 DIAGNOSIS — N1831 Chronic kidney disease, stage 3a: Secondary | ICD-10-CM | POA: Diagnosis not present

## 2022-10-30 DIAGNOSIS — E1122 Type 2 diabetes mellitus with diabetic chronic kidney disease: Secondary | ICD-10-CM | POA: Diagnosis not present

## 2022-11-13 DIAGNOSIS — T148XXD Other injury of unspecified body region, subsequent encounter: Secondary | ICD-10-CM | POA: Diagnosis not present

## 2022-11-13 DIAGNOSIS — Z09 Encounter for follow-up examination after completed treatment for conditions other than malignant neoplasm: Secondary | ICD-10-CM | POA: Diagnosis not present

## 2022-11-15 ENCOUNTER — Ambulatory Visit: Payer: Medicare Other | Admitting: Podiatry

## 2022-11-22 ENCOUNTER — Telehealth: Payer: Self-pay | Admitting: Family Medicine

## 2022-11-22 ENCOUNTER — Ambulatory Visit: Payer: Medicare Other | Admitting: Podiatry

## 2022-11-22 NOTE — Telephone Encounter (Signed)
Copied from CRM [id number]. Topic: General - Other >> Nov 22, 2022 10:53 AM Macon Large wrote: Reason for CRM: Pt stated she needs to speak with Delice Bison because she has some questions. Pt requests that Delice Bison return her call at [phone]

## 2022-11-22 NOTE — Telephone Encounter (Signed)
Copied from CRM #[id number]. Topic: General - Other >> Nov 22, 2022 10:53 AM Ja-Kwan M wrote: Reason for CRM: Pt stated she needs to speak with Tara because she has some questions. Pt requests that Tara return her call at [phone] 

## 2022-11-23 ENCOUNTER — Other Ambulatory Visit: Payer: Self-pay

## 2022-11-23 DIAGNOSIS — R300 Dysuria: Secondary | ICD-10-CM

## 2022-11-23 LAB — POCT URINALYSIS DIPSTICK
Bilirubin, UA: NEGATIVE
Glucose, UA: NEGATIVE
Ketones, UA: NEGATIVE
Nitrite, UA: NEGATIVE
Protein, UA: POSITIVE — AB
Spec Grav, UA: <=1.005 — AB (ref 1.010–1.025)
Urobilinogen, UA: 0.2 E.U./dL
pH, UA: 7 (ref 5.0–8.0)

## 2022-11-23 MED ORDER — NITROFURANTOIN MONOHYD MACRO 100 MG PO CAPS
100.0000 mg | ORAL_CAPSULE | Freq: Two times a day (BID) | ORAL | 0 refills | Status: AC
Start: 2022-11-23 — End: 2022-11-30

## 2022-11-29 ENCOUNTER — Ambulatory Visit (INDEPENDENT_AMBULATORY_CARE_PROVIDER_SITE_OTHER): Payer: Medicare Other | Admitting: Family Medicine

## 2022-11-29 ENCOUNTER — Encounter: Payer: Self-pay | Admitting: Family Medicine

## 2022-11-29 DIAGNOSIS — I10 Essential (primary) hypertension: Secondary | ICD-10-CM | POA: Diagnosis not present

## 2022-11-29 DIAGNOSIS — E7801 Familial hypercholesterolemia: Secondary | ICD-10-CM

## 2022-11-29 DIAGNOSIS — E119 Type 2 diabetes mellitus without complications: Secondary | ICD-10-CM

## 2022-11-29 DIAGNOSIS — Z7984 Long term (current) use of oral hypoglycemic drugs: Secondary | ICD-10-CM | POA: Diagnosis not present

## 2022-11-29 DIAGNOSIS — K219 Gastro-esophageal reflux disease without esophagitis: Secondary | ICD-10-CM | POA: Diagnosis not present

## 2022-11-29 MED ORDER — OMEPRAZOLE 20 MG PO CPDR: DELAYED_RELEASE_CAPSULE | ORAL | 1 refills | Status: DC

## 2022-11-29 MED ORDER — AMLODIPINE BESYLATE 10 MG PO TABS: ORAL_TABLET | ORAL | 1 refills | Status: DC

## 2022-11-29 MED ORDER — ATORVASTATIN CALCIUM 20 MG PO TABS: ORAL_TABLET | ORAL | 1 refills | Status: DC

## 2022-11-29 MED ORDER — METFORMIN HCL 500 MG PO TABS: ORAL_TABLET | ORAL | 1 refills | Status: DC

## 2022-11-29 MED ORDER — GLIPIZIDE ER 2.5 MG PO TB24: ORAL_TABLET | ORAL | 1 refills | Status: DC

## 2022-11-29 MED ORDER — CARVEDILOL 3.125 MG PO TABS: ORAL_TABLET | ORAL | 5 refills | Status: DC

## 2022-11-29 NOTE — Progress Notes (Signed)
Date:  11/29/2022   Name:  Frances Harmon   DOB:  1946-07-04   MRN:  [id number]   Chief Complaint: Gastroesophageal Reflux, Diabetes, Hyperlipidemia, and Hypertension  Gastroesophageal Reflux She reports no abdominal pain, no chest pain, no coughing, no dysphagia, no heartburn, no nausea, no sore throat or no wheezing. This is a chronic problem. The current episode started more than 1 year ago. The problem has been gradually improving. The symptoms are aggravated by certain foods. She has tried a PPI for the symptoms. The treatment provided moderate relief.  Diabetes She presents for her follow-up diabetic visit. She has type 2 diabetes mellitus. Her disease course has been stable. There are no hypoglycemic associated symptoms. Pertinent negatives for hypoglycemia include no dizziness, headaches or nervousness/anxiousness. There are no diabetic associated symptoms. Pertinent negatives for diabetes include no chest pain, no polydipsia and no polyuria. There are no hypoglycemic complications. Symptoms are stable. There are no diabetic complications. Current diabetic treatment includes oral agent (dual therapy) (glypizide/metformen). She is compliant with treatment all of the time. Her weight is stable. Meal planning includes avoidance of concentrated sweets and carbohydrate counting. An ACE inhibitor/angiotensin II receptor blocker is not being taken.  Hyperlipidemia This is a chronic problem. The current episode started more than 1 year ago. The problem is controlled. Recent lipid tests were reviewed and are normal. She has no history of diabetes. Pertinent negatives include no chest pain, myalgias or shortness of breath. Current antihyperlipidemic treatment includes statins. The current treatment provides moderate improvement of lipids. There are no compliance problems.  Risk factors for coronary artery disease include dyslipidemia, hypertension and diabetes mellitus.  Hypertension This is a  chronic problem. The current episode started more than 1 year ago. The problem has been gradually improving since onset. The problem is controlled. Pertinent negatives include no chest pain, headaches, neck pain, orthopnea, palpitations, peripheral edema, PND or shortness of breath. There are no associated agents to hypertension. Past treatments include beta blockers, alpha 1 blockers and calcium channel blockers. The current treatment provides moderate improvement.    Lab Results  Component Value Date   NA 145 (H) 02/12/2022   K 4.8 02/12/2022   CO2 23 02/12/2022   GLUCOSE 92 02/12/2022   BUN 21 02/12/2022   CREATININE 1.43 (H) 02/12/2022   CALCIUM 9.5 02/12/2022   EGFR 38 (L) 02/12/2022   GFRNONAA >60 07/27/2020   Lab Results  Component Value Date   CHOL 128 09/01/2021   HDL 59 09/01/2021   LDLCALC 53 09/01/2021   TRIG 82 09/01/2021   CHOLHDL 2.4 09/12/2017   No results found for: "TSH" Lab Results  Component Value Date   HGBA1C 5.3 07/30/2022   Lab Results  Component Value Date   WBC 5.8 10/17/2022   HGB 11.1 (L) 10/17/2022   HCT 34.3 (L) 10/17/2022   MCV 92.0 10/17/2022   PLT 107 (L) 10/17/2022   Lab Results  Component Value Date   ALT 11 10/22/2019   AST 13 10/22/2019   ALKPHOS 90 10/22/2019   BILITOT 0.5 10/22/2019   No results found for: "25OHVITD2", "25OHVITD3", "VD25OH"   Review of Systems  Constitutional:  Negative for chills and fever.  HENT:  Negative for drooling, ear discharge, ear pain and sore throat.   Respiratory:  Negative for cough, shortness of breath and wheezing.   Cardiovascular:  Negative for chest pain, palpitations, orthopnea, leg swelling and PND.  Gastrointestinal:  Negative for abdominal pain, blood in stool, constipation,  diarrhea, dysphagia, heartburn and nausea.  Endocrine: Negative for polydipsia and polyuria.  Genitourinary:  Negative for difficulty urinating, dysuria, frequency, hematuria and urgency.  Musculoskeletal:  Negative  for back pain, myalgias and neck pain.  Skin:  Negative for rash.  Allergic/Immunologic: Negative for environmental allergies.  Neurological:  Negative for dizziness and headaches.  Hematological:  Does not bruise/bleed easily.  Psychiatric/Behavioral:  Negative for suicidal ideas. The patient is not nervous/anxious.     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   Acute respiratory failure with hypoxia (HCC) 11/05/2020   H/O total hysterectomy with bilateral salpingo-oophorectomy (BSO) 09/06/2020   Thrombocytopenia (HCC) 04/23/2019   Type 2 diabetes mellitus with complication, without long-term current use of insulin (HCC) 04/28/2015   Essential hypertension 04/28/2015   Hyperlipidemia 04/28/2015   Esophageal reflux 04/28/2015   Vitamin D deficiency 04/28/2015    No Known Allergies  Past Surgical History:  Procedure Laterality Date   COLONOSCOPY  2009   repeat in 5 years- Dr Porfirio Mylar   COLONOSCOPY WITH PROPOFOL N/A 09/09/2015   Procedure: COLONOSCOPY WITH PROPOFOL;  Surgeon: Wallace Cullens, MD;  Location: [facility] ENDOSCOPY;  Service: Gastroenterology;  Laterality: N/A;   FOOT SURGERY Bilateral    Bunion removal   INCONTINENCE SURGERY     VAGINAL HYSTERECTOMY     VENTRAL HERNIA REPAIR N/A 05/06/2015   Procedure: HERNIA REPAIR VENTRAL ADULT;  Surgeon: Nadeen Landau, MD;  Location: [facility] ORS;  Service: General;  Laterality: N/A;    Social History   Tobacco Use   Smoking status: Never   Smokeless tobacco: Never   Tobacco comments:    smokin cessation materials not required  Vaping Use   Vaping Use:  Never used  Substance Use Topics   Alcohol use: No    Alcohol/week: 0.0 standard drinks of alcohol   Drug use: No     Medication list has been reviewed and updated.  Current Meds  Medication Sig   Acetaminophen (TYLENOL) 325 MG CAPS PRN   amLODipine (NORVASC) 10 MG tablet TAKE (1) TABLET BY MOUTH EVERY DAY   atorvastatin (LIPITOR) 20 MG tablet TAKE (1) TABLET BY MOUTH EVERY DAY   carvedilol (COREG) 3.125 MG tablet TAKE (1) TABLET BY MOUTH TWICE DAILY WITH A MEAL   furosemide (LASIX) 40 MG tablet Take by mouth.   glipiZIDE (GLUCOTROL XL) 2.5 MG 24 hr tablet Take 1 tablet by mouth every day   losartan (COZAAR) 25 MG tablet Take 25 mg by mouth daily.   metFORMIN (GLUCOPHAGE) 500 MG tablet TAKE (1) TABLET BY MOUTH TWICE DAILY   nitrofurantoin, macrocrystal-monohydrate, (MACROBID) 100 MG capsule Take 1 capsule (100 mg total) by mouth 2 (two) times daily for 7 days.   omeprazole (PRILOSEC) 20 MG capsule Take 1 capsule by mouth every morning       09/10/2022    3:55 PM 07/30/2022    2:53 PM 03/30/2022   10:50 AM 02/12/2022    3:11 PM  GAD 7 : Generalized Anxiety Score  Nervous, Anxious, on Edge 0 0 0 0  Control/stop worrying 0 0 0 0  Worry too much - different things 0  0 0 0  Trouble relaxing 0 0 0 0  Restless 0 0 0 0  Easily annoyed or irritable 0 0 0 0  Afraid - awful might happen 0 0 0 0  Total GAD 7 Score 0 0 0 0  Anxiety Difficulty Not difficult at all Not difficult at all Not difficult at all Not difficult at all       09/10/2022    3:55 PM 07/30/2022    2:53 PM 03/30/2022   10:50 AM  Depression screen PHQ 2/9  Decreased Interest 0 0 0  Down, Depressed, Hopeless 0 0 0  PHQ - 2 Score 0 0 0  Altered sleeping 0 0 0  Tired, decreased energy 0 0 0  Change in appetite 0 0 0  Feeling bad or failure about yourself  0 0 0  Trouble concentrating 0 0 0  Moving slowly or fidgety/restless 0 0 0  Suicidal thoughts 0 0 0  PHQ-9 Score 0 0 0  Difficult doing work/chores Not difficult at  all Not difficult at all Not difficult at all    BP Readings from Last 3 Encounters:  11/29/22 138/62  10/17/22 133/66  09/10/22 (!) 154/92    Physical Exam Vitals and nursing note reviewed. Exam conducted with a chaperone present.  Constitutional:      General: She is not in acute distress.    Appearance: She is not diaphoretic.  HENT:     Head: Normocephalic and atraumatic.     Right Ear: Tympanic membrane and external ear normal.     Left Ear: Tympanic membrane and external ear normal.     Nose: Nose normal.     Mouth/Throat:     Mouth: Mucous membranes are moist.  Eyes:     General:        Right eye: No discharge.        Left eye: No discharge.     Conjunctiva/sclera: Conjunctivae normal.     Pupils: Pupils are equal, round, and reactive to light.  Neck:     Thyroid: No thyromegaly.     Vascular: No JVD.  Cardiovascular:     Rate and Rhythm: Normal rate and regular rhythm.     Heart sounds: Normal heart sounds. No murmur heard.    No friction rub. No gallop.  Pulmonary:     Effort: Pulmonary effort is normal.     Breath sounds: Normal breath sounds. No wheezing, rhonchi or rales.  Abdominal:     General: Bowel sounds are normal.     Palpations: Abdomen is soft. There is no mass.     Tenderness: There is no abdominal tenderness. There is no guarding.  Musculoskeletal:        General: Normal range of motion.     Cervical back: Normal range of motion and neck supple.  Lymphadenopathy:     Cervical: No cervical adenopathy.  Skin:    General: Skin is warm and dry.  Neurological:     Mental Status: She is alert.     Deep Tendon Reflexes: Reflexes are normal and symmetric.     Wt Readings from Last 3 Encounters:  11/29/22 154 lb (69.9 kg)  10/17/22 164 lb 4.8 oz (74.5 kg)  09/10/22 161 lb (73 kg)    BP 138/62   Pulse (!) 56   Ht 5\' 6"  (1.676 m)   Wt 154 lb (69.9 kg)   SpO2 98%   BMI 24.86 kg/m   Assessment and Plan: 1. Primary  hypertension Chronic.   Controlled.  Stable.  Blood pressure today is 138/62 asymptomatic.  Tolerating medications well.  Continue amlodipine 10 mg once a day and carvedilol 3.1251 twice a day..  Will recheck in 6 - amLODipine (NORVASC) 10 MG tablet; TAKE (1) TABLET BY MOUTH EVERY DAY  Dispense: 90 tablet; Refill: 1  2. Familial hypercholesterolemia Chronic.  Controlled.  Stable.  Continue atorvastatin 20 mg once a day. - atorvastatin (LIPITOR) 20 MG tablet; TAKE (1) TABLET BY MOUTH EVERY DAY  Dispense: 90 tablet; Refill: 1  3. Essential hypertension Chronic.  Controlled.  Stable.  See above. - carvedilol (COREG) 3.125 MG tablet; TAKE (1) TABLET BY MOUTH TWICE DAILY WITH A MEAL  Dispense: 60 tablet; Refill: 5  4. Type 2 diabetes mellitus without complication, without long-term current use of insulin (HCC) Chronic.  Controlled.  Stable.  In addition to dietary management patient also takes glipizide XL 2.5 mg once a day and metformin 500 mg twice a day.  Will check A1c for current level of control.  Will recheck status of control in 4 months. - glipiZIDE (GLUCOTROL XL) 2.5 MG 24 hr tablet; Take 1 tablet by mouth every day  Dispense: 90 tablet; Refill: 1 - metFORMIN (GLUCOPHAGE) 500 MG tablet; TAKE (1) TABLET BY MOUTH TWICE DAILY  Dispense: 180 tablet; Refill: 1 - HgB A1c  5. GERD without esophagitis Chronic.  Controlled.  Stable.  Continue omeprazole 20 mg once a day.  Will recheck in 4 to 6 months. - omeprazole (PRILOSEC) 20 MG capsule; Take 1 capsule by mouth every morning  Dispense: 90 capsule; Refill: 1     Elizabeth Sauer, MD

## 2022-11-30 ENCOUNTER — Encounter: Payer: Self-pay | Admitting: *Deleted

## 2022-11-30 LAB — HEMOGLOBIN A1C
Est. average glucose Bld gHb Est-mCnc: 103 mg/dL
Hgb A1c MFr Bld: 5.2 % (ref 4.8–5.6)

## 2022-11-30 NOTE — Telephone Encounter (Signed)
This encounter was created in error - please disregard.

## 2022-12-06 ENCOUNTER — Ambulatory Visit: Payer: Medicare Other | Admitting: Podiatry

## 2022-12-11 ENCOUNTER — Ambulatory Visit: Payer: Medicare Other | Admitting: Podiatry

## 2022-12-11 ENCOUNTER — Other Ambulatory Visit: Payer: Self-pay | Admitting: Family Medicine

## 2022-12-11 DIAGNOSIS — M79674 Pain in right toe(s): Secondary | ICD-10-CM

## 2022-12-11 DIAGNOSIS — M79675 Pain in left toe(s): Secondary | ICD-10-CM | POA: Diagnosis not present

## 2022-12-11 DIAGNOSIS — I10 Essential (primary) hypertension: Secondary | ICD-10-CM

## 2022-12-11 DIAGNOSIS — E0843 Diabetes mellitus due to underlying condition with diabetic autonomic (poly)neuropathy: Secondary | ICD-10-CM | POA: Diagnosis not present

## 2022-12-11 DIAGNOSIS — B351 Tinea unguium: Secondary | ICD-10-CM

## 2022-12-11 NOTE — Telephone Encounter (Signed)
Rx 11/29/22- no print- will resend today Requested Prescriptions  Pending Prescriptions Disp Refills   carvedilol (COREG) 3.125 MG tablet [Pharmacy Med Name: CARVEDILOL TAB 3.125MG ] 60 tablet 5    Sig: TAKE (1) TABLET BY MOUTH TWICE DAILY WITH A MEAL     Cardiovascular: Beta Blockers 3 Failed - 12/11/2022  8:52 AM      Failed - Cr in normal range and within 360 days    Creatinine  Date Value Ref Range Status  08/06/2013 0.87 0.60 - 1.30 mg/dL Final   Creatinine, Ser  Date Value Ref Range Status  02/12/2022 1.43 (H) 0.57 - 1.00 mg/dL Final         Failed - AST in normal range and within 360 days    AST  Date Value Ref Range Status  10/22/2019 13 0 - 40 IU/L Final   SGOT(AST)  Date Value Ref Range Status  08/06/2013 19 15 - 37 Unit/L Final         Failed - ALT in normal range and within 360 days    ALT  Date Value Ref Range Status  10/22/2019 11 0 - 32 IU/L Final   SGPT (ALT)  Date Value Ref Range Status  08/06/2013 34 12 - 78 U/L Final         Passed - Last BP in normal range    BP Readings from Last 1 Encounters:  11/29/22 138/62         Passed - Last Heart Rate in normal range    Pulse Readings from Last 1 Encounters:  11/29/22 (!) 56         Passed - Valid encounter within last 6 months    Recent Outpatient Visits           1 week ago Primary hypertension   [place] [facility], MD   3 months ago Acute maxillary sinusitis, recurrence not specified   [place] [facility], MD   4 months ago Type 2 diabetes mellitus without complication, without long-term current use of insulin (HCC)   [place] [facility], MD   8 months ago Primary hypertension   [place] [facility], MD   9 months ago Primary hypertension   [place] [facility], MD

## 2022-12-11 NOTE — Progress Notes (Signed)
This patient returns to my office for at risk foot care.  This patient requires this care by a professional since this patient will be at risk due to having type 2 diabetes.  This patient is unable to cut nails herself since the patient cannot reach her nails.These nails are painful walking and wearing shoes.  This patient presents for at risk foot care today.  General Appearance  Alert, conversant and in no acute stress.  Vascular  Dorsalis pedis and posterior tibial  pulses are palpable  bilaterally.  Capillary return is within normal limits  bilaterally. Temperature is within normal limits  bilaterally.  Neurologic  Senn-Weinstein monofilament wire test within normal limits  bilaterally. Muscle power within normal limits bilaterally.  Nails Thick disfigured discolored nails with subungual debris  from hallux to fifth toes bilaterally. No evidence of bacterial infection or drainage bilaterally.  Orthopedic  No limitations of motion  feet .  No crepitus or effusions noted.  No bony pathology or digital deformities noted.  Midtarsal  DJD  B/L.  Skin  normotropic skin with no porokeratosis noted bilaterally.  No signs of infections or ulcers noted.     Onychomycosis  Pain in right toes  Pain in left toes  Consent was obtained for treatment procedures.   Mechanical debridement of nails 1-5  bilaterally performed with a nail nipper.  Filed with dremel without incident.    Return office visit  3 months                    Told patient to return for periodic foot care and evaluation due to potential at risk complications.   Nicholes Rough D.P.M.

## 2023-01-01 ENCOUNTER — Other Ambulatory Visit: Payer: Self-pay | Admitting: Family Medicine

## 2023-01-01 DIAGNOSIS — E7801 Familial hypercholesterolemia: Secondary | ICD-10-CM

## 2023-01-02 NOTE — Telephone Encounter (Signed)
Requested medication (s) are due for refill today - no  Requested medication (s) are on the active medication list -yes  Future visit scheduled -no  Last refill: 11/29/22 #90 1RF- please review marked as no print- may not have transmitted  Notes to clinic: fails refill protocol- labs- over 1 year-09/01/21, see above  Requested Prescriptions  Pending Prescriptions Disp Refills   atorvastatin (LIPITOR) 20 MG tablet [Pharmacy Med Name: ATORVASTATIN TAB 20MG ] 90 tablet 1    Sig: TAKE (1) TABLET BY MOUTH EVERY DAY     Cardiovascular:  Antilipid - Statins Failed - 01/01/2023 10:55 AM      Failed - Lipid Panel in normal range within the last 12 months    Cholesterol, Total  Date Value Ref Range Status  09/01/2021 128 100 - 199 mg/dL Final   LDL Chol Calc (NIH)  Date Value Ref Range Status  09/01/2021 53 0 - 99 mg/dL Final   HDL  Date Value Ref Range Status  09/01/2021 59 >39 mg/dL Final   Triglycerides  Date Value Ref Range Status  09/01/2021 82 0 - 149 mg/dL Final         Passed - Patient is not pregnant      Passed - Valid encounter within last 12 months    Recent Outpatient Visits           1 month ago Primary hypertension   [place] [facility], MD   3 months ago Acute maxillary sinusitis, recurrence not specified   [place] [facility] at [facility], MD   5 months ago Type 2 diabetes mellitus without complication, without long-term current use of insulin (HCC)   [place] [facility], MD   9 months ago Primary hypertension   [place] [facility], MD   10 months ago Primary hypertension   [place] [facility], MD                 Requested Prescriptions  Pending Prescriptions Disp Refills    atorvastatin (LIPITOR) 20 MG tablet [Pharmacy Med Name: ATORVASTATIN TAB 20MG ] 90 tablet 1    Sig: TAKE (1) TABLET BY MOUTH EVERY DAY     Cardiovascular:  Antilipid - Statins Failed - 01/01/2023 10:55 AM      Failed - Lipid Panel in normal range within the last 12 months    Cholesterol, Total  Date Value Ref Range Status  09/01/2021 128 100 - 199 mg/dL Final   LDL Chol Calc (NIH)  Date Value Ref Range Status  09/01/2021 53 0 - 99 mg/dL Final   HDL  Date Value Ref Range Status  09/01/2021 59 >39 mg/dL Final   Triglycerides  Date Value Ref Range Status  09/01/2021 82 0 - 149 mg/dL Final         Passed - Patient is not pregnant      Passed - Valid encounter within last 12 months    Recent Outpatient Visits           1 month ago Primary hypertension    [facility], [place]   3 months ago Acute maxillary sinusitis, recurrence not specified   [facility]  at [facility], MD   5 months ago Type 2 diabetes mellitus without complication, without long-term current use of insulin (HCC)   [place] [facility], MD   9 months ago Primary hypertension   [place] [facility], MD   10 months ago Primary hypertension   [facility], MD

## 2023-01-14 ENCOUNTER — Telehealth: Payer: Self-pay | Admitting: Family Medicine

## 2023-01-14 ENCOUNTER — Other Ambulatory Visit: Payer: Self-pay

## 2023-01-14 DIAGNOSIS — E7801 Familial hypercholesterolemia: Secondary | ICD-10-CM

## 2023-01-14 MED ORDER — ATORVASTATIN CALCIUM 20 MG PO TABS: ORAL_TABLET | ORAL | 1 refills | Status: DC

## 2023-01-14 NOTE — Telephone Encounter (Signed)
Medication Refill - Medication: atorvastatin (LIPITOR) 20 MG tablet   Has the patient contacted their pharmacy? Yes   Preferred Pharmacy (with phone number or street name):  [facility] - [place], [place] - [address] Phone: [phone]  Fax: [phone]       Has the patient been seen for an appointment in the last year OR does the patient have an upcoming appointment? Yes.    Please assist patient further

## 2023-01-17 DIAGNOSIS — D631 Anemia in chronic kidney disease: Secondary | ICD-10-CM | POA: Diagnosis not present

## 2023-01-17 DIAGNOSIS — N1831 Chronic kidney disease, stage 3a: Secondary | ICD-10-CM | POA: Diagnosis not present

## 2023-01-17 DIAGNOSIS — E1122 Type 2 diabetes mellitus with diabetic chronic kidney disease: Secondary | ICD-10-CM | POA: Diagnosis not present

## 2023-01-17 DIAGNOSIS — N2581 Secondary hyperparathyroidism of renal origin: Secondary | ICD-10-CM | POA: Diagnosis not present

## 2023-01-17 DIAGNOSIS — I10 Essential (primary) hypertension: Secondary | ICD-10-CM | POA: Diagnosis not present

## 2023-02-15 ENCOUNTER — Inpatient Hospital Stay: Payer: Medicare Other | Attending: Oncology

## 2023-02-15 ENCOUNTER — Other Ambulatory Visit: Payer: Medicare Other

## 2023-02-15 DIAGNOSIS — D631 Anemia in chronic kidney disease: Secondary | ICD-10-CM | POA: Insufficient documentation

## 2023-02-15 DIAGNOSIS — D649 Anemia, unspecified: Secondary | ICD-10-CM

## 2023-02-15 DIAGNOSIS — N182 Chronic kidney disease, stage 2 (mild): Secondary | ICD-10-CM | POA: Diagnosis not present

## 2023-02-15 LAB — FERRITIN: Ferritin: 36 ng/mL (ref 11–307)

## 2023-02-15 LAB — CBC
HCT: 37.9 % (ref 36.0–46.0)
Hemoglobin: 12.2 g/dL (ref 12.0–15.0)
MCH: 29.3 pg (ref 26.0–34.0)
MCHC: 32.2 g/dL (ref 30.0–36.0)
MCV: 90.9 fL (ref 80.0–100.0)
Platelets: 122 10*3/uL — ABNORMAL LOW (ref 150–400)
RBC: 4.17 MIL/uL (ref 3.87–5.11)
RDW: 13.1 % (ref 11.5–15.5)
WBC: 6.2 10*3/uL (ref 4.0–10.5)
nRBC: 0 % (ref 0.0–0.2)

## 2023-02-15 LAB — IRON AND TIBC
Iron: 64 ug/dL (ref 28–170)
Saturation Ratios: 19 % (ref 10.4–31.8)
TIBC: 333 ug/dL (ref 250–450)
UIBC: 269 ug/dL

## 2023-02-15 LAB — VITAMIN B12: Vitamin B-12: 931 pg/mL — ABNORMAL HIGH (ref 180–914)

## 2023-02-20 ENCOUNTER — Other Ambulatory Visit: Payer: Self-pay | Admitting: Family Medicine

## 2023-02-20 DIAGNOSIS — K219 Gastro-esophageal reflux disease without esophagitis: Secondary | ICD-10-CM

## 2023-02-21 NOTE — Telephone Encounter (Signed)
Requested Prescriptions  Pending Prescriptions Disp Refills   omeprazole (PRILOSEC) 20 MG capsule [Pharmacy Med Name: OMEPRAZOLE CAP 20MG ] 90 capsule 0    Sig: TAKE (1) CAPSULE BY MOUTH EVERY MORNING     Gastroenterology: Proton Pump Inhibitors Passed - 02/20/2023  2:51 PM      Passed - Valid encounter within last 12 months    Recent Outpatient Visits           2 months ago Primary hypertension   [place] [facility], MD   5 months ago Acute maxillary sinusitis, recurrence not specified   [place] [facility] at [facility], MD   6 months ago Type 2 diabetes mellitus without complication, without long-term current use of insulin (HCC)   [place] [facility], MD   10 months ago Primary hypertension   [place] [facility], MD   11 months ago Primary hypertension   [place] [facility], MD

## 2023-03-14 ENCOUNTER — Ambulatory Visit: Payer: Medicare Other | Admitting: Podiatry

## 2023-03-14 DIAGNOSIS — B351 Tinea unguium: Secondary | ICD-10-CM | POA: Diagnosis not present

## 2023-03-14 DIAGNOSIS — E0843 Diabetes mellitus due to underlying condition with diabetic autonomic (poly)neuropathy: Secondary | ICD-10-CM

## 2023-03-14 DIAGNOSIS — M79675 Pain in left toe(s): Secondary | ICD-10-CM

## 2023-03-14 DIAGNOSIS — M79674 Pain in right toe(s): Secondary | ICD-10-CM

## 2023-03-14 DIAGNOSIS — D696 Thrombocytopenia, unspecified: Secondary | ICD-10-CM

## 2023-03-19 ENCOUNTER — Other Ambulatory Visit: Payer: Self-pay | Admitting: Family Medicine

## 2023-03-19 DIAGNOSIS — E119 Type 2 diabetes mellitus without complications: Secondary | ICD-10-CM

## 2023-03-22 ENCOUNTER — Encounter: Payer: Self-pay | Admitting: Podiatry

## 2023-03-22 NOTE — Progress Notes (Signed)
  Subjective:  Patient ID: Frances Harmon, female    DOB: October 14, 1945,  MRN: [id number]  Frances Harmon presents to clinic today for preventative diabetic foot care and painful elongated mycotic toenails 1-5 bilaterally which are tender when wearing enclosed shoe gear. Pain is relieved with periodic professional debridement.  Chief Complaint  Patient presents with   Nail Problem    DFC,Referring Provider Elizabeth Sauer C, MD,lov:05/24,A1C:5.4,BS: unknown      New problem(s): None.   PCP is Duanne Limerick, MD.  No Known Allergies  Review of Systems: Negative except as noted in the HPI.  Objective: No changes noted in today's physical examination. There were no vitals filed for this visit. Frances Harmon is a pleasant 77 y.o. female WD, WN in NAD. AAO x 3.  Vascular Examination: Capillary refill time immediate b/l. Vascular status intact b/l with palpable pedal pulses. Pedal hair present b/l. No pain with calf compression b/l. Skin temperature gradient WNL b/l. No cyanosis or clubbing b/l. No ischemia or gangrene noted b/l.   Neurological Examination: Sensation grossly intact b/l with 10 gram monofilament. Vibratory sensation intact b/l.   Dermatological Examination: Pedal skin with normal turgor, texture and tone b/l.  No open wounds. No interdigital macerations.   Toenails 1-5 b/l thick, discolored, elongated with subungual debris and pain on dorsal palpation.   No corns, calluses nor porokeratotic lesions noted.  Musculoskeletal Examination: Muscle strength 5/5 to all lower extremity muscle groups bilaterally. Limited joint ROM to the midtarsal joint b/l.  Radiographs: None  Last A1c:      Latest Ref Rng & Units 11/29/2022    3:53 PM 07/30/2022    3:25 PM  Hemoglobin A1C  Hemoglobin-A1c 4.8 - 5.6 % 5.2  5.3    Assessment/Plan: 1. Pain due to onychomycosis of toenails of both feet   2. Thrombocytopenia (HCC)   3. Diabetes mellitus due to underlying condition  with diabetic autonomic neuropathy, unspecified whether long term insulin use (HCC)    Patient was evaluated and treated. All patient's and/or POA's questions/concerns addressed on today's visit. Toenails 1-5 debrided in length and girth without incident. Continue soft, supportive shoe gear daily. Report any pedal injuries to medical professional. Call office if there are any questions/concerns. -Patient/POA to call should there be question/concern in the interim.   Return in about 3 months (around 06/14/2023).  Freddie Breech, DPM

## 2023-04-03 ENCOUNTER — Ambulatory Visit (INDEPENDENT_AMBULATORY_CARE_PROVIDER_SITE_OTHER): Payer: Medicare Other

## 2023-04-03 VITALS — BP 132/70 | Ht 66.0 in | Wt 169.0 lb

## 2023-04-03 DIAGNOSIS — Z0000 Encounter for general adult medical examination without abnormal findings: Secondary | ICD-10-CM | POA: Diagnosis not present

## 2023-04-03 NOTE — Progress Notes (Signed)
Subjective:   Frances Harmon is a 77 y.o. female who presents for Medicare Annual (Subsequent) preventive examination.  Visit Complete: In person   Review of Systems     Cardiac Risk Factors include: advanced age (>57men, >36 women);diabetes mellitus;dyslipidemia;hypertension     Objective:    Today's Vitals   04/03/23 1450  BP: 132/70  Weight: 169 lb (76.7 kg)  Height: 5\' 6"  (1.676 m)   Body mass index is 27.28 kg/m.     04/03/2023    3:00 PM 10/17/2022    2:29 PM 04/13/2022    2:02 PM 01/10/2022    2:34 PM 03/25/2021   11:47 AM 07/27/2020    9:17 AM 03/02/2020    2:10 PM  Advanced Directives  Does Patient Have a Medical Advance Directive? No Yes Yes Yes Yes Yes Yes  Type of Furniture conservator/restorer;Living will Healthcare Power of [place];Living will Healthcare Power of [place];Living will Living will;Healthcare Power of Attorney Living will Healthcare Power of [place];Living will  Does patient want to make changes to medical advance directive?    No - Patient declined     Copy of Healthcare Power of Attorney in Chart?    No - copy requested No - copy requested  No - copy requested  Would patient like information on creating a medical advance directive? No - Patient declined    No - Patient declined      Current Medications (verified) Outpatient Encounter Medications as of 04/03/2023  Medication Sig   Acetaminophen (TYLENOL) 325 MG CAPS PRN   amLODipine (NORVASC) 10 MG tablet TAKE (1) TABLET BY MOUTH EVERY DAY   atorvastatin (LIPITOR) 20 MG tablet TAKE (1) TABLET BY MOUTH EVERY DAY   carvedilol (COREG) 3.125 MG tablet TAKE (1) TABLET BY MOUTH TWICE DAILY WITH A MEAL   glipiZIDE (GLUCOTROL XL) 2.5 MG 24 hr tablet Take 1 tablet by mouth every day   losartan (COZAAR) 25 MG tablet Take 25 mg by mouth daily.   metFORMIN (GLUCOPHAGE) 500 MG tablet TAKE (1) TABLET BY MOUTH TWICE DAILY   omeprazole (PRILOSEC) 20 MG capsule TAKE (1) CAPSULE BY MOUTH  EVERY MORNING   furosemide (LASIX) 40 MG tablet Take by mouth. (Patient not taking: Reported on 04/03/2023)   No facility-administered encounter medications on file as of 04/03/2023.    Allergies (verified) Patient has no known allergies.   History: Past Medical History:  Diagnosis Date   Diabetes mellitus without complication (HCC)    GERD (gastroesophageal reflux disease)    Hyperlipidemia    Hypertension    Past Surgical History:  Procedure Laterality Date   COLONOSCOPY  2009   repeat in 5 years- Dr Porfirio Mylar   COLONOSCOPY WITH PROPOFOL N/A 09/09/2015   Procedure: COLONOSCOPY WITH PROPOFOL;  Surgeon: Wallace Cullens, MD;  Location: [facility] ENDOSCOPY;  Service: Gastroenterology;  Laterality: N/A;   FOOT SURGERY Bilateral    Bunion removal   INCONTINENCE SURGERY     VAGINAL HYSTERECTOMY     VENTRAL HERNIA REPAIR N/A 05/06/2015   Procedure: HERNIA REPAIR VENTRAL ADULT;  Surgeon: Nadeen Landau, MD;  Location: [facility] ORS;  Service: General;  Laterality: N/A;   Family History  Problem Relation Age of Onset   Breast cancer Daughter 19   Breast cancer Mother 68   Hypertension Brother    Diabetes Brother    Arthritis Brother    Social History   Socioeconomic History   Marital status: Married    Spouse  name: Not on file   Number of children: 2   Years of education: Not on file   Highest education level: 12th grade  Occupational History   Occupation: Retired    Comment: works part time  Tobacco Use   Smoking status: Never   Smokeless tobacco: Never   Tobacco comments:    smokin cessation materials not required  Vaping Use   Vaping status: Never Used  Substance and Sexual Activity   Alcohol use: No    Alcohol/week: 0.0 standard drinks of alcohol   Drug use: No   Sexual activity: Not Currently  Other Topics Concern   Not on file  Social History Narrative   Not on file   Social Determinants of Health   Financial Resource Strain: Low Risk  (04/03/2023)   Overall  Financial Resource Strain (CARDIA)    Difficulty of Paying Living Expenses: Not hard at all  Food Insecurity: No Food Insecurity (04/03/2023)   Hunger Vital Sign    Worried About Running Out of Food in the Last Year: Never true    Ran Out of Food in the Last Year: Never true  Transportation Needs: No Transportation Needs (04/03/2023)   PRAPARE - Administrator, Civil Service (Medical): No    Lack of Transportation (Non-Medical): No  Physical Activity: Sufficiently Active (04/03/2023)   Exercise Vital Sign    Days of Exercise per Week: 5 days    Minutes of Exercise per Session: 30 min  Stress: No Stress Concern Present (04/03/2023)   [employer] of Occupational Health - Occupational Stress Questionnaire    Feeling of Stress : Not at all  Social Connections: Moderately Integrated (04/03/2023)   Social Connection and Isolation Panel [NHANES]    Frequency of Communication with Friends and Family: Twice a week    Frequency of Social Gatherings with Friends and Family: Twice a week    Attends Religious Services: More than 4 times per year    Active Member of [employer] or Organizations: No    Attends Engineer, structural: Never    Marital Status: Married    Tobacco Counseling Counseling given: Not Answered Tobacco comments: smokin cessation materials not required   Clinical Intake:  Pre-visit preparation completed: Yes  Pain : No/denies pain     Nutritional Status: BMI 25 -29 Overweight Diabetes: Yes CBG done?: No Did pt. bring in CBG monitor from home?: No  How often do you need to have someone help you when you read instructions, pamphlets, or other written materials from your doctor or pharmacy?: 1 - Never  Interpreter Needed?: No  Information entered by :: Kennedy Bucker, LPN   Activities of Daily Living    04/03/2023    3:00 PM  In your present state of health, do you have any difficulty performing the following activities:  Hearing? 0  Vision? 0   Difficulty concentrating or making decisions? 0  Walking or climbing stairs? 0  Dressing or bathing? 0  Doing errands, shopping? 0  Preparing Food and eating ? N  Using the Toilet? N  In the past six months, have you accidently leaked urine? N  Do you have problems with loss of bowel control? N  Managing your Medications? N  Managing your Finances? N  Housekeeping or managing your Housekeeping? N    Patient Care Team: Duanne Limerick, MD as PCP - General (Family Medicine)  Indicate any recent Medical Services you may have received from other than [facility] providers in  the past year (date may be approximate).     Assessment:   This is a routine wellness examination for Sandi.  Hearing/Vision screen Hearing Screening - Comments:: No aids Vision Screening - Comments:: Readers- Dr.Woodard    Goals Addressed             This Visit's Progress    DIET - EAT MORE FRUITS AND VEGETABLES         Depression Screen    04/03/2023    2:58 PM 09/10/2022    3:55 PM 07/30/2022    2:53 PM 03/30/2022   10:50 AM 02/12/2022    3:10 PM 01/02/2022    2:31 PM 09/01/2021    3:07 PM  PHQ 2/9 Scores  PHQ - 2 Score 0 0 0 0 0 0 0  PHQ- 9 Score 0 0 0 0 2 1 0    Fall Risk    04/03/2023    3:00 PM 09/10/2022    3:56 PM 03/30/2022   10:50 AM 02/12/2022    3:10 PM 05/03/2021   11:59 AM  Fall Risk   Falls in the past year? 0 0 0 0 0  Number falls in past yr: 0 0 0 0 0  Injury with Fall? 0 0 0 0 0  Risk for fall due to : No Fall Risks History of fall(s) No Fall Risks No Fall Risks No Fall Risks  Follow up Falls prevention discussed;Falls evaluation completed Falls evaluation completed Falls evaluation completed Falls evaluation completed Falls evaluation completed    MEDICARE RISK AT HOME: Medicare Risk at Home Any stairs in or around the home?: No If so, are there any without handrails?: No Home free of loose throw rugs in walkways, pet beds, electrical cords, etc?: Yes Adequate lighting in  your home to reduce risk of falls?: Yes Life alert?: No Use of a cane, walker or w/c?: No Grab bars in the bathroom?: Yes Shower chair or bench in shower?: Yes Elevated toilet seat or a handicapped toilet?: Yes  TIMED UP AND GO:  Was the test performed?  Yes  Length of time to ambulate 10 feet: 4 sec Gait steady and fast without use of assistive device    Cognitive Function:        04/03/2023    3:01 PM 03/30/2022    2:37 PM 03/25/2021   11:41 AM 03/02/2020    2:15 PM 03/02/2019    2:19 PM  6CIT Screen  What Year? 0 points 0 points 0 points 0 points 0 points  What month? 0 points 0 points 0 points 0 points 0 points  What time? 0 points 0 points 0 points 0 points 0 points  Count back from 20 0 points 0 points 0 points 0 points 0 points  Months in reverse 0 points 0 points 0 points 0 points 2 points  Repeat phrase 0 points 0 points 0 points 0 points 0 points  Total Score 0 points 0 points 0 points 0 points 2 points    Immunizations Immunization History  Administered Date(s) Administered   Fluad Quad(high Dose 65+) 06/16/2019, 05/19/2020, 05/03/2021, 03/30/2022   Influenza, High Dose Seasonal PF 03/19/2017, 04/09/2018   Influenza,inj,Quad PF,6+ Mos 04/28/2015   Influenza-Unspecified 03/23/2021   Moderna Sars-Covid-2 Vaccination 09/07/2019, 09/28/2019   Pneumococcal Polysaccharide-23 04/10/2013    TDAP status: Due, Education has been provided regarding the importance of this vaccine. Advised may receive this vaccine at local pharmacy or Health Dept. Aware to provide a copy of the  vaccination record if obtained from local pharmacy or Health Dept. Verbalized acceptance and understanding.  Flu Vaccine status: Declined, Education has been provided regarding the importance of this vaccine but patient still declined. Advised may receive this vaccine at local pharmacy or Health Dept. Aware to provide a copy of the vaccination record if obtained from local pharmacy or Health Dept.  Verbalized acceptance and understanding.  Pneumococcal vaccine status: Declined,  Education has been provided regarding the importance of this vaccine but patient still declined. Advised may receive this vaccine at local pharmacy or Health Dept. Aware to provide a copy of the vaccination record if obtained from local pharmacy or Health Dept. Verbalized acceptance and understanding.   Covid-19 vaccine status: Completed vaccines  Qualifies for Shingles Vaccine? Yes   Zostavax completed No   Shingrix Completed?: No.    Education has been provided regarding the importance of this vaccine. Patient has been advised to call insurance company to determine out of pocket expense if they have not yet received this vaccine. Advised may also receive vaccine at local pharmacy or Health Dept. Verbalized acceptance and understanding.  Screening Tests Health Maintenance  Topic Date Due   Zoster Vaccines- Shingrix (1 of 2) Never done   Pneumonia Vaccine 28+ Years old (2 of 2 - PCV) 04/10/2014   MAMMOGRAM  01/10/2021   FOOT EXAM  11/03/2022   Diabetic kidney evaluation - eGFR measurement  02/13/2023   INFLUENZA VACCINE  02/21/2023   COVID-19 Vaccine (3 - 2023-24 season) 03/24/2023   OPHTHALMOLOGY EXAM  04/26/2023   Diabetic kidney evaluation - Urine ACR  05/08/2023   HEMOGLOBIN A1C  06/01/2023   Medicare Annual Wellness (AWV)  04/02/2024   Colonoscopy  10/27/2029   DEXA SCAN  Completed   Hepatitis C Screening  Completed   HPV VACCINES  Aged Out   DTaP/Tdap/Td  Discontinued    Health Maintenance  Health Maintenance Due  Topic Date Due   Zoster Vaccines- Shingrix (1 of 2) Never done   Pneumonia Vaccine 17+ Years old (2 of 2 - PCV) 04/10/2014   MAMMOGRAM  01/10/2021   FOOT EXAM  11/03/2022   Diabetic kidney evaluation - eGFR measurement  02/13/2023   INFLUENZA VACCINE  02/21/2023   COVID-19 Vaccine (3 - 2023-24 season) 03/24/2023    Colorectal cancer screening: No longer required.   Mammogram  status: No longer required due to age.  Bone Density status: Completed 03/14/20. Results reflect: Bone density results: OSTEOPENIA. Repeat every 5 years.  Lung Cancer Screening: (Low Dose CT Chest recommended if Age 61-80 years, 20 pack-year currently smoking OR have quit w/in 15years.) does not qualify.    Additional Screening:  Hepatitis C Screening: does qualify; Completed 01/21/17  Vision Screening: Recommended annual ophthalmology exams for early detection of glaucoma and other disorders of the eye. Is the patient up to date with their annual eye exam?  Yes  Who is the provider or what is the name of the office in which the patient attends annual eye exams? Dr.Woodard If pt is not established with a provider, would they like to be referred to a provider to establish care? No .   Dental Screening: Recommended annual dental exams for proper oral hygiene   Community Resource Referral / Chronic Care Management: CRR required this visit?  No   CCM required this visit?  No     Plan:     I have personally reviewed and noted the following in the patient's chart:   Medical and  social history Use of alcohol, tobacco or illicit drugs  Current medications and supplements including opioid prescriptions. Patient is not currently taking opioid prescriptions. Functional ability and status Nutritional status Physical activity Advanced directives List of other physicians Hospitalizations, surgeries, and ER visits in previous 12 months Vitals Screenings to include cognitive, depression, and falls Referrals and appointments  In addition, I have reviewed and discussed with patient certain preventive protocols, quality metrics, and best practice recommendations. A written personalized care plan for preventive services as well as general preventive health recommendations were provided to patient.     Hal Hope, LPN   10/29/8117   After Visit Summary: (MyChart) Due to this being a  telephonic visit, the after visit summary with patients personalized plan was offered to patient via MyChart   Nurse Notes: wants to wait until PE next week to get shots

## 2023-04-03 NOTE — Patient Instructions (Addendum)
Frances Harmon , Thank you for taking time to come for your Medicare Wellness Visit. I appreciate your ongoing commitment to your health goals. Please review the following plan we discussed and let me know if I can assist you in the future.   Referrals/Orders/Follow-Ups/Clinician Recommendations: none  This is a list of the screening recommended for you and due dates:  Health Maintenance  Topic Date Due   Zoster (Shingles) Vaccine (1 of 2) Never done   Pneumonia Vaccine (2 of 2 - PCV) 04/10/2014   Mammogram  01/10/2021   Complete foot exam   11/03/2022   Yearly kidney function blood test for diabetes  02/13/2023   Flu Shot  02/21/2023   COVID-19 Vaccine (3 - 2023-24 season) 03/24/2023   Eye exam for diabetics  04/26/2023   Yearly kidney health urinalysis for diabetes  05/08/2023   Hemoglobin A1C  06/01/2023   Medicare Annual Wellness Visit  04/02/2024   Colon Cancer Screening  10/27/2029   DEXA scan (bone density measurement)  Completed   Hepatitis C Screening  Completed   HPV Vaccine  Aged Out   DTaP/Tdap/Td vaccine  Discontinued    Advanced directives: (ACP Link)Information on Advanced Care Planning can be found at [facility] [place] Advance Health Care Directives Advance Health Care Directives ([email or web address]   Next Medicare Annual Wellness Visit scheduled for next year: Yes   04/08/24 @ 2:30 pm in person

## 2023-04-11 ENCOUNTER — Ambulatory Visit (INDEPENDENT_AMBULATORY_CARE_PROVIDER_SITE_OTHER): Payer: Medicare Other | Admitting: Family Medicine

## 2023-04-11 ENCOUNTER — Encounter: Payer: Self-pay | Admitting: Family Medicine

## 2023-04-11 VITALS — BP 128/60 | HR 55 | Ht 66.0 in | Wt 170.0 lb

## 2023-04-11 DIAGNOSIS — Z23 Encounter for immunization: Secondary | ICD-10-CM

## 2023-04-11 DIAGNOSIS — E119 Type 2 diabetes mellitus without complications: Secondary | ICD-10-CM | POA: Diagnosis not present

## 2023-04-11 DIAGNOSIS — Z7984 Long term (current) use of oral hypoglycemic drugs: Secondary | ICD-10-CM | POA: Diagnosis not present

## 2023-04-11 MED ORDER — METFORMIN HCL 500 MG PO TABS
ORAL_TABLET | ORAL | 1 refills | Status: DC
Start: 2023-04-11 — End: 2023-08-15

## 2023-04-11 MED ORDER — GLIPIZIDE ER 2.5 MG PO TB24
ORAL_TABLET | ORAL | 1 refills | Status: DC
Start: 2023-04-11 — End: 2023-08-15

## 2023-04-11 NOTE — Progress Notes (Signed)
Date:  04/11/2023   Name:  Frances Harmon   DOB:  01-04-1946   MRN:  [id number]   Chief Complaint: Diabetes and Flu Vaccine  Diabetes She presents for her follow-up diabetic visit. She has type 2 diabetes mellitus. Her disease course has been stable. There are no hypoglycemic associated symptoms. Pertinent negatives for hypoglycemia include no dizziness, headaches or nervousness/anxiousness. There are no diabetic associated symptoms. Pertinent negatives for diabetes include no chest pain, no fatigue, no polydipsia, no polyuria and no weakness. There are no hypoglycemic complications. Symptoms are stable. There are no diabetic complications. She is compliant with treatment most of the time. She is following a generally healthy diet. Meal planning includes avoidance of concentrated sweets and carbohydrate counting. She participates in exercise daily. An ACE inhibitor/angiotensin II receptor blocker is being taken. Eye exam is current.    Lab Results  Component Value Date   NA 145 (H) 02/12/2022   K 4.8 02/12/2022   CO2 23 02/12/2022   GLUCOSE 92 02/12/2022   BUN 21 02/12/2022   CREATININE 1.43 (H) 02/12/2022   CALCIUM 9.5 02/12/2022   EGFR 38 (L) 02/12/2022   GFRNONAA >60 07/27/2020   Lab Results  Component Value Date   CHOL 128 09/01/2021   HDL 59 09/01/2021   LDLCALC 53 09/01/2021   TRIG 82 09/01/2021   CHOLHDL 2.4 09/12/2017   No results found for: "TSH" Lab Results  Component Value Date   HGBA1C 5.2 11/29/2022   Lab Results  Component Value Date   WBC 6.2 02/15/2023   HGB 12.2 02/15/2023   HCT 37.9 02/15/2023   MCV 90.9 02/15/2023   PLT 122 (L) 02/15/2023   Lab Results  Component Value Date   ALT 11 10/22/2019   AST 13 10/22/2019   ALKPHOS 90 10/22/2019   BILITOT 0.5 10/22/2019   No results found for: "25OHVITD2", "25OHVITD3", "VD25OH"   Review of Systems  Constitutional: Negative.  Negative for chills, fatigue, fever and unexpected weight change.  HENT:   Negative for congestion, ear discharge, ear pain, rhinorrhea, sinus pressure, sneezing and sore throat.   Eyes:  Negative for photophobia.  Respiratory:  Negative for cough, shortness of breath, wheezing and stridor.   Cardiovascular:  Negative for chest pain, palpitations and leg swelling.  Gastrointestinal:  Negative for abdominal pain, blood in stool, constipation, diarrhea and nausea.  Endocrine: Negative for polydipsia and polyuria.  Genitourinary:  Negative for dysuria, flank pain, frequency, hematuria, urgency and vaginal bleeding.  Musculoskeletal:  Negative for arthralgias, back pain and myalgias.  Skin:  Negative for rash.  Neurological:  Negative for dizziness, weakness and headaches.  Hematological:  Negative for adenopathy. Does not bruise/bleed easily.  Psychiatric/Behavioral:  Negative for dysphoric mood. The patient is not nervous/anxious.     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   Acute respiratory failure with hypoxia (HCC) 11/05/2020   H/O total hysterectomy with bilateral salpingo-oophorectomy (BSO) 09/06/2020   Thrombocytopenia (HCC) 04/23/2019   Type 2 diabetes mellitus with complication, without long-term current use of insulin (HCC) 04/28/2015   Essential hypertension 04/28/2015   Hyperlipidemia 04/28/2015   Esophageal reflux  04/28/2015   Vitamin D deficiency 04/28/2015    No Known Allergies  Past Surgical History:  Procedure Laterality Date   COLONOSCOPY  2009   repeat in 5 years- Dr Porfirio Mylar   COLONOSCOPY WITH PROPOFOL N/A 09/09/2015   Procedure: COLONOSCOPY WITH PROPOFOL;   Surgeon: Wallace Cullens, MD;  Location: [facility] ENDOSCOPY;  Service: Gastroenterology;  Laterality: N/A;   FOOT SURGERY Bilateral    Bunion removal   INCONTINENCE SURGERY     VAGINAL HYSTERECTOMY     VENTRAL HERNIA REPAIR N/A 05/06/2015   Procedure: HERNIA REPAIR VENTRAL ADULT;  Surgeon: Nadeen Landau, MD;  Location: [facility] ORS;  Service: General;  Laterality: N/A;    Social History   Tobacco Use   Smoking status: Never   Smokeless tobacco: Never   Tobacco comments:    smokin cessation materials not required  Vaping Use   Vaping status: Never Used  Substance Use Topics   Alcohol use: No    Alcohol/week: 0.0 standard drinks of alcohol   Drug use: No     Medication list has been reviewed and updated.  Current Meds  Medication Sig   Acetaminophen (TYLENOL) 325 MG CAPS PRN   amLODipine (NORVASC) 10 MG tablet TAKE (1) TABLET BY MOUTH EVERY DAY   atorvastatin (LIPITOR) 20 MG tablet TAKE (1) TABLET BY MOUTH EVERY DAY   carvedilol (COREG) 3.125 MG tablet TAKE (1) TABLET BY MOUTH TWICE DAILY WITH A MEAL   glipiZIDE (GLUCOTROL XL) 2.5 MG 24 hr tablet Take 1 tablet by mouth every day   losartan (COZAAR) 25 MG tablet Take 25 mg by mouth daily.   metFORMIN (GLUCOPHAGE) 500 MG tablet TAKE (1) TABLET BY MOUTH TWICE DAILY   omeprazole (PRILOSEC) 20 MG capsule TAKE (1) CAPSULE BY MOUTH EVERY MORNING       04/11/2023    2:28 PM 09/10/2022    3:55 PM 07/30/2022    2:53 PM 03/30/2022   10:50 AM  GAD 7 : Generalized Anxiety Score  Nervous, Anxious, on Edge 0 0 0 0  Control/stop worrying 0 0 0 0  Worry too much - different things 0 0 0 0  Trouble relaxing 0 0 0 0  Restless 0 0 0 0  Easily annoyed or irritable 0 0 0 0  Afraid - awful might happen 0 0 0 0  Total GAD 7 Score 0 0 0 0  Anxiety Difficulty Not difficult at all Not difficult at all Not difficult at all Not difficult at all       04/11/2023    2:27 PM 04/03/2023    2:58 PM 09/10/2022    3:55 PM  Depression screen PHQ 2/9   Decreased Interest 0 0 0  Down, Depressed, Hopeless 0 0 0  PHQ - 2 Score 0 0 0  Altered sleeping 0 0 0  Tired, decreased energy 0 0 0  Change in appetite 0 0 0  Feeling bad or failure about yourself  0 0 0  Trouble concentrating 0 0 0  Moving slowly or fidgety/restless 0 0 0  Suicidal thoughts 0 0 0  PHQ-9 Score 0 0 0  Difficult doing work/chores Not difficult at all Not difficult at all Not difficult at all    BP Readings from Last 3 Encounters:  04/11/23 128/60  04/03/23 132/70  11/29/22 138/62    Physical Exam Vitals and nursing note reviewed. Exam conducted with a chaperone present.  Constitutional:      General: She is not in  acute distress.    Appearance: She is not diaphoretic.  HENT:     Head: Normocephalic and atraumatic.     Right Ear: Tympanic membrane and external ear normal.     Left Ear: Tympanic membrane and external ear normal.     Nose: Nose normal. No congestion or rhinorrhea.     Mouth/Throat:     Mouth: Mucous membranes are moist.     Pharynx: No oropharyngeal exudate or posterior oropharyngeal erythema.  Eyes:     General:        Right eye: No discharge.        Left eye: No discharge.     Conjunctiva/sclera: Conjunctivae normal.     Pupils: Pupils are equal, round, and reactive to light.  Neck:     Thyroid: No thyromegaly.     Vascular: No JVD.  Cardiovascular:     Rate and Rhythm: Normal rate and regular rhythm.     Heart sounds: Normal heart sounds. No murmur heard.    No friction rub. No gallop.  Pulmonary:     Effort: Pulmonary effort is normal.     Breath sounds: Normal breath sounds. No wheezing, rhonchi or rales.  Abdominal:     General: Bowel sounds are normal. There is no distension.     Palpations: Abdomen is soft. There is no mass.     Tenderness: There is no abdominal tenderness. There is no guarding.  Musculoskeletal:        General: Normal range of motion.     Cervical back: Normal range of motion and neck supple.   Lymphadenopathy:     Cervical: No cervical adenopathy.  Skin:    General: Skin is warm and dry.  Neurological:     Mental Status: She is alert.     Deep Tendon Reflexes: Reflexes are normal and symmetric.     Wt Readings from Last 3 Encounters:  04/11/23 170 lb (77.1 kg)  04/03/23 169 lb (76.7 kg)  11/29/22 154 lb (69.9 kg)    BP 128/60   Pulse (!) 55   Ht 5\' 6"  (1.676 m)   Wt 170 lb (77.1 kg)   SpO2 98%   BMI 27.44 kg/m   Assessment and Plan:  1. Type 2 diabetes mellitus without complication, without long-term current use of insulin (HCC) Chronic.  Controlled.  Stable.  Asymptomatic.  Tolerating medications well.  Continue metformin 500 mg twice a day and glipizide XL 2.5 mg once a day.  Will check A1c for current level of control. - metFORMIN (GLUCOPHAGE) 500 MG tablet; TAKE (1) TABLET BY MOUTH TWICE DAILY  Dispense: 180 tablet; Refill: 1 - glipiZIDE (GLUCOTROL XL) 2.5 MG 24 hr tablet; Take 1 tablet by mouth every day  Dispense: 90 tablet; Refill: 1 - HgB A1c  2. Flu vaccine need Discussed and administered - Flu Vaccine Trivalent High Dose (Fluad)    Elizabeth Sauer, MD

## 2023-04-12 LAB — HEMOGLOBIN A1C
Est. average glucose Bld gHb Est-mCnc: 111 mg/dL
Hgb A1c MFr Bld: 5.5 % (ref 4.8–5.6)

## 2023-04-17 ENCOUNTER — Other Ambulatory Visit: Payer: Self-pay | Admitting: Family Medicine

## 2023-04-17 DIAGNOSIS — I10 Essential (primary) hypertension: Secondary | ICD-10-CM

## 2023-04-22 ENCOUNTER — Other Ambulatory Visit: Payer: Self-pay | Admitting: Family Medicine

## 2023-04-22 DIAGNOSIS — I10 Essential (primary) hypertension: Secondary | ICD-10-CM

## 2023-04-26 ENCOUNTER — Other Ambulatory Visit: Payer: Self-pay | Admitting: Family Medicine

## 2023-04-26 DIAGNOSIS — E119 Type 2 diabetes mellitus without complications: Secondary | ICD-10-CM

## 2023-04-26 NOTE — Telephone Encounter (Signed)
Request is too soon for refill, last refill 04/11/23 for 90 and 1 refill.  Requested Prescriptions  Pending Prescriptions Disp Refills   glipiZIDE (GLUCOTROL XL) 2.5 MG 24 hr tablet [Pharmacy Med Name: GLIPIZIDE ER TAB 2.5MG ] 90 tablet 1    Sig: TAKE (1) TABLET BY MOUTH EVERY DAY     Endocrinology:  Diabetes - Sulfonylureas Failed - 04/26/2023 11:23 AM      Failed - Cr in normal range and within 360 days    Creatinine  Date Value Ref Range Status  08/06/2013 0.87 0.60 - 1.30 mg/dL Final   Creatinine, Ser  Date Value Ref Range Status  02/12/2022 1.43 (H) 0.57 - 1.00 mg/dL Final         Passed - HBA1C is between 0 and 7.9 and within 180 days    Hemoglobin A1C  Date Value Ref Range Status  09/30/2018 5.6  Final   Hgb A1c MFr Bld  Date Value Ref Range Status  04/11/2023 5.5 4.8 - 5.6 % Final    Comment:             Prediabetes: 5.7 - 6.4          Diabetes: >6.4          Glycemic control for adults with diabetes: <7.0          Passed - Valid encounter within last 6 months    Recent Outpatient Visits           2 weeks ago Type 2 diabetes mellitus without complication, without long-term current use of insulin (HCC)   [place] [facility], [facility]   4 months ago Primary hypertension   [place] [facility]   7 months ago Acute maxillary sinusitis, recurrence not specified   [place] [facility], [facility]   9 months ago Type 2 diabetes mellitus without complication, without long-term current use of insulin (HCC)   [place] [facility], MD   1 year ago Primary hypertension   [place] [facility], [facility]       Future Appointments             In 3 months Duanne Limerick, MD [facility] [place] [facility]  Sports Medicine at [facility], [facility]

## 2023-04-29 DIAGNOSIS — H35033 Hypertensive retinopathy, bilateral: Secondary | ICD-10-CM | POA: Diagnosis not present

## 2023-04-29 DIAGNOSIS — E119 Type 2 diabetes mellitus without complications: Secondary | ICD-10-CM | POA: Diagnosis not present

## 2023-04-29 DIAGNOSIS — H1045 Other chronic allergic conjunctivitis: Secondary | ICD-10-CM | POA: Diagnosis not present

## 2023-04-29 DIAGNOSIS — H02883 Meibomian gland dysfunction of right eye, unspecified eyelid: Secondary | ICD-10-CM | POA: Diagnosis not present

## 2023-04-29 DIAGNOSIS — H02886 Meibomian gland dysfunction of left eye, unspecified eyelid: Secondary | ICD-10-CM | POA: Diagnosis not present

## 2023-05-20 ENCOUNTER — Other Ambulatory Visit: Payer: Self-pay | Admitting: Family Medicine

## 2023-05-20 DIAGNOSIS — K219 Gastro-esophageal reflux disease without esophagitis: Secondary | ICD-10-CM

## 2023-05-22 ENCOUNTER — Other Ambulatory Visit: Payer: Self-pay | Admitting: Family Medicine

## 2023-05-22 DIAGNOSIS — K219 Gastro-esophageal reflux disease without esophagitis: Secondary | ICD-10-CM

## 2023-05-22 NOTE — Telephone Encounter (Signed)
Medication Refill - Medication: omeprazole (PRILOSEC) 20 MG capsule [1610  Has the patient contacted their pharmacy? Yes.    Preferred Pharmacy (with phone number or street name):   [facility], [place] - [address]  [address] [place], [place] [address]  Phone:  [phone]  Fax:  [phone]     Has the patient been seen for an appointment in the last year OR does the patient have an upcoming appointment? Yes.    Agent: Please be advised that RX refills may take up to 3 business days. We ask that you follow-up with your pharmacy.

## 2023-05-23 NOTE — Telephone Encounter (Signed)
Requested Prescriptions  Refused Prescriptions Disp Refills   omeprazole (PRILOSEC) 20 MG capsule 90 capsule 0    Sig: TAKE (1) CAPSULE BY MOUTH EVERY MORNING     Gastroenterology: Proton Pump Inhibitors Passed - 05/22/2023  2:39 PM      Passed - Valid encounter within last 12 months    Recent Outpatient Visits           1 month ago Type 2 diabetes mellitus without complication, without long-term current use of insulin (HCC)   [place] [facility], MD   5 months ago Primary hypertension   [place] [facility], MD   8 months ago Acute maxillary sinusitis, recurrence not specified   [place] [facility] at [facility], MD   9 months ago Type 2 diabetes mellitus without complication, without long-term current use of insulin (HCC)   [place] [facility], MD   1 year ago Primary hypertension   [place] [facility], [facility]       Future Appointments             In 2 months Duanne Limerick, MD [facility] & [facility] Medicine at [facility], [facility]

## 2023-05-27 DIAGNOSIS — D631 Anemia in chronic kidney disease: Secondary | ICD-10-CM | POA: Diagnosis not present

## 2023-05-27 DIAGNOSIS — N2581 Secondary hyperparathyroidism of renal origin: Secondary | ICD-10-CM | POA: Diagnosis not present

## 2023-05-27 DIAGNOSIS — I10 Essential (primary) hypertension: Secondary | ICD-10-CM | POA: Diagnosis not present

## 2023-05-27 DIAGNOSIS — E1122 Type 2 diabetes mellitus with diabetic chronic kidney disease: Secondary | ICD-10-CM | POA: Diagnosis not present

## 2023-05-27 DIAGNOSIS — N1832 Chronic kidney disease, stage 3b: Secondary | ICD-10-CM | POA: Diagnosis not present

## 2023-06-06 ENCOUNTER — Other Ambulatory Visit: Payer: Self-pay | Admitting: Family Medicine

## 2023-06-06 DIAGNOSIS — I10 Essential (primary) hypertension: Secondary | ICD-10-CM

## 2023-06-07 NOTE — Telephone Encounter (Signed)
Requested medication (s) are due for refill today - yes  Requested medication (s) are on the active medication list -yes  Future visit scheduled -yes  Last refill: 12/11/22 #60 5RF  Notes to clinic: fails lab protocol- over 1 year-2021  Requested Prescriptions  Pending Prescriptions Disp Refills   carvedilol (COREG) 3.125 MG tablet [Pharmacy Med Name: CARVEDILOL 3.125MG  TABLET] 60 tablet 5    Sig: TAKE (1) TABLET BY MOUTH TWICE DAILY WITH A MEAL     Cardiovascular: Beta Blockers 3 Failed - 06/06/2023  5:05 PM      Failed - Cr in normal range and within 360 days    Creatinine  Date Value Ref Range Status  08/06/2013 0.87 0.60 - 1.30 mg/dL Final   Creatinine, Ser  Date Value Ref Range Status  02/12/2022 1.43 (H) 0.57 - 1.00 mg/dL Final         Failed - AST in normal range and within 360 days    AST  Date Value Ref Range Status  10/22/2019 13 0 - 40 IU/L Final   SGOT(AST)  Date Value Ref Range Status  08/06/2013 19 15 - 37 Unit/L Final         Failed - ALT in normal range and within 360 days    ALT  Date Value Ref Range Status  10/22/2019 11 0 - 32 IU/L Final   SGPT (ALT)  Date Value Ref Range Status  08/06/2013 34 12 - 78 U/L Final         Passed - Last BP in normal range    BP Readings from Last 1 Encounters:  04/11/23 128/60         Passed - Last Heart Rate in normal range    Pulse Readings from Last 1 Encounters:  04/11/23 (!) 55         Passed - Valid encounter within last 6 months    Recent Outpatient Visits           1 month ago Type 2 diabetes mellitus without complication, without long-term current use of insulin (HCC)   [place] [facility], MD   6 months ago Primary hypertension   [place] [facility], MD   9 months ago Acute maxillary sinusitis, recurrence not specified   [place] [facility] at [facility], MD   10 months ago Type 2 diabetes mellitus without complication, without long-term current use of insulin (HCC)   [place] [facility], MD   1 year ago Primary hypertension   [place] [facility], MD       Future Appointments             In 2 months Duanne Limerick, MD [facility] & Sports Medicine at [facility], [facility]               Requested Prescriptions  Pending Prescriptions Disp Refills   carvedilol (COREG) 3.125 MG tablet [Pharmacy Med Name: CARVEDILOL 3.125MG  TABLET] 60 tablet 5    Sig: TAKE (1) TABLET BY MOUTH TWICE DAILY WITH A MEAL     Cardiovascular: Beta Blockers 3 Failed - 06/06/2023  5:05 PM      Failed - Cr in normal range and within 360 days  Creatinine  Date Value Ref Range Status  08/06/2013 0.87 0.60 - 1.30 mg/dL Final   Creatinine, Ser  Date Value Ref Range Status  02/12/2022 1.43 (H) 0.57 - 1.00 mg/dL Final         Failed - AST in normal range and within 360 days    AST  Date Value Ref Range Status  10/22/2019 13 0 - 40 IU/L Final   SGOT(AST)  Date Value Ref Range Status  08/06/2013 19 15 - 37 Unit/L Final         Failed - ALT in normal range and within 360 days    ALT  Date Value Ref Range Status  10/22/2019 11 0 - 32 IU/L Final   SGPT (ALT)  Date Value Ref Range Status  08/06/2013 34 12 - 78 U/L Final         Passed - Last BP in normal range    BP Readings from Last 1 Encounters:  04/11/23 128/60         Passed - Last Heart Rate in normal range    Pulse Readings from Last 1 Encounters:  04/11/23 (!) 55         Passed - Valid encounter within last 6 months    Recent Outpatient Visits           1 month ago Type 2 diabetes mellitus without complication, without long-term current use of insulin (HCC)   [place] [facility], MD   6 months ago Primary hypertension   [place] [facility], MD   9 months ago Acute maxillary sinusitis, recurrence not specified   [place] [facility], MD   10 months ago Type 2 diabetes mellitus without complication, without long-term current use of insulin (HCC)   [place] [facility], MD   1 year ago Primary hypertension   [place] [facility], MD       Future Appointments             In 2 months Duanne Limerick, MD [facility] at [facility], [facility]

## 2023-06-17 ENCOUNTER — Encounter: Payer: Self-pay | Admitting: Podiatry

## 2023-06-17 ENCOUNTER — Ambulatory Visit: Payer: Medicare Other | Admitting: Podiatry

## 2023-06-17 VITALS — Ht 66.0 in | Wt 170.0 lb

## 2023-06-17 DIAGNOSIS — D696 Thrombocytopenia, unspecified: Secondary | ICD-10-CM

## 2023-06-17 DIAGNOSIS — L84 Corns and callosities: Secondary | ICD-10-CM | POA: Diagnosis not present

## 2023-06-17 DIAGNOSIS — B351 Tinea unguium: Secondary | ICD-10-CM

## 2023-06-17 DIAGNOSIS — E0843 Diabetes mellitus due to underlying condition with diabetic autonomic (poly)neuropathy: Secondary | ICD-10-CM | POA: Diagnosis not present

## 2023-06-17 DIAGNOSIS — E119 Type 2 diabetes mellitus without complications: Secondary | ICD-10-CM

## 2023-06-17 DIAGNOSIS — M79675 Pain in left toe(s): Secondary | ICD-10-CM | POA: Diagnosis not present

## 2023-06-17 DIAGNOSIS — M79674 Pain in right toe(s): Secondary | ICD-10-CM | POA: Diagnosis not present

## 2023-06-17 NOTE — Progress Notes (Signed)
ANNUAL DIABETIC FOOT EXAM  Subjective: Frances Harmon presents today for annual diabetic foot exam.  Chief Complaint  Patient presents with   Diabetes    Patient is here for routine DFC, Last A1C: 5.5-(04/11/2023)   Patient confirms h/o diabetes.  Patient denies any h/o foot wounds.  Patient has been diagnosed with neuropathy.  Duanne Limerick, MD is patient's PCP.  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   Physical deconditioning 11/17/2020   Large bowel obstruction (HCC) 11/14/2020   Shock circulatory (HCC) 11/05/2020   Sepsis (HCC) 11/05/2020   Mesenteric ischemia (HCC) 11/05/2020   Acute respiratory failure with hypoxia (HCC) 11/05/2020   H/O total hysterectomy with bilateral salpingo-oophorectomy (BSO) 09/06/2020   Chronic kidney disease 07/23/2020   Thrombocytopenia (HCC) 04/23/2019   Type 2 diabetes mellitus with complication, without long-term current use of insulin (HCC) 04/28/2015   Essential hypertension 04/28/2015   Hyperlipidemia 04/28/2015   Esophageal reflux 04/28/2015   Vitamin D deficiency 04/28/2015   Past Surgical History:  Procedure Laterality Date   COLONOSCOPY  2009   repeat in 5 years- Dr Porfirio Mylar   COLONOSCOPY WITH PROPOFOL N/A 09/09/2015   Procedure: COLONOSCOPY WITH PROPOFOL;  Surgeon: Wallace Cullens, MD;  Location: [facility] ENDOSCOPY;  Service: Gastroenterology;  Laterality: N/A;   FOOT  SURGERY Bilateral    Bunion removal   INCONTINENCE SURGERY     VAGINAL HYSTERECTOMY     VENTRAL HERNIA REPAIR N/A 05/06/2015   Procedure: HERNIA REPAIR VENTRAL ADULT;  Surgeon: Nadeen Landau, MD;  Location: [facility] ORS;  Service: General;  Laterality: N/A;   Current Outpatient Medications on File Prior to Visit  Medication Sig Dispense Refill   Acetaminophen (TYLENOL) 325 MG CAPS PRN     amLODipine (NORVASC) 10 MG tablet TAKE (1) TABLET BY MOUTH EVERY DAY 90 tablet 1   atorvastatin (LIPITOR) 20 MG tablet TAKE (1) TABLET BY MOUTH EVERY DAY 90 tablet 1   carvedilol (COREG) 3.125 MG tablet TAKE (1) TABLET BY MOUTH TWICE DAILY WITH A MEAL 60 tablet 2   glipiZIDE (GLUCOTROL XL) 2.5 MG 24 hr tablet Take 1 tablet by mouth every day 90 tablet 1   losartan (COZAAR) 25 MG tablet Take 25 mg by mouth daily.     metFORMIN (GLUCOPHAGE) 500 MG tablet TAKE (1) TABLET BY MOUTH TWICE DAILY 180 tablet 1   omeprazole (PRILOSEC) 20 MG capsule TAKE (1) CAPSULE BY MOUTH EVERY MORNING 90 capsule 0   No current facility-administered medications on file prior to visit.    No Known Allergies Social History   Occupational History   Occupation: Retired    Comment: works part time  Tobacco Use   Smoking status: Never   Smokeless tobacco: Never   Tobacco comments:    smokin cessation materials not required  Vaping Use   Vaping status: Never Used  Substance and Sexual Activity   Alcohol use: No    Alcohol/week: 0.0 standard drinks  of alcohol   Drug use: No   Sexual activity: Not Currently   Family History  Problem Relation Age of Onset   Breast cancer Daughter 57   Breast cancer Mother 65   Hypertension Brother    Diabetes Brother    Arthritis Brother    Immunization History  Administered Date(s) Administered   Fluad Quad(high Dose 65+) 06/16/2019, 05/19/2020, 05/03/2021, 03/30/2022   Fluad Trivalent(High Dose 65+) 04/11/2023   Influenza, High Dose Seasonal PF 03/19/2017, 04/09/2018    Influenza,inj,Quad PF,6+ Mos 04/28/2015   Influenza-Unspecified 03/23/2021   Moderna Sars-Covid-2 Vaccination 09/07/2019, 09/28/2019   Pneumococcal Polysaccharide-23 04/10/2013     Review of Systems: Negative except as noted in the HPI.   Objective: There were no vitals filed for this visit.  DANITZA Harmon is a pleasant 77 y.o. female in NAD. AAO X 3.  Title   Diabetic Foot Exam - detailed Date & Time: 06/17/2023  2:15 PM Diabetic Foot exam was performed with the following findings: Yes  Visual Foot Exam completed.: Yes  Is there a history of foot ulcer?: No Is there a foot ulcer now?: No Is there swelling?: No Is there elevated skin temperature?: No Is there abnormal foot shape?: No Is there a claw toe deformity?: Yes (Comment: Right 2nd digit) Are the toenails long?: Yes Are the toenails thick?: Yes Are the toenails ingrown?: No Is the skin thin, fragile, shiny and hairless?": No Normal Range of Motion?: Yes Is there foot or ankle muscle weakness?: No Do you have pain in calf while walking?: No Are the shoes appropriate in style and fit?: Yes Can the patient see the bottom of their feet?: Yes Pulse Foot Exam completed.: Yes   Right Posterior Tibialis: Present Left posterior Tibialis: Present   Right Dorsalis Pedis: Present Left Dorsalis Pedis: Present     Sensory Foot Exam Completed.: Yes Semmes-Weinstein Monofilament Test "+" means "has sensation" and "-" means "no sensation"  R Foot Test Control: Pos L Foot Test Control: Pos   R Site 1-Great Toe: Pos L Site 1-Great Toe: Pos   R Site 4: Pos L Site 4: Pos   R site 5: Pos L Site 5: Pos  R Site 6: Pos L Site 6: Pos     Image components are not supported.   Image components are not supported. Image components are not supported.  Tuning [place]ork Right vibratory: present Left vibratory: present  Comments Hyperkeratotic lesion(s) bilateral great toes and 1st metatarsal head of both feet.  No erythema, no edema, no  drainage, no fluctuance.      Lab Results  Component Value Date   HGBA1C 5.5 04/11/2023   ADA Risk Categorization: Low Risk :  Patient has all of the following: Intact protective sensation No prior foot ulcer  No severe deformity Pedal pulses present  Assessment: 1. Pain due to onychomycosis of toenails of both feet   2. Callus   3. Thrombocytopenia (HCC)   4. Diabetes mellitus due to underlying condition with diabetic autonomic neuropathy, unspecified whether long term insulin use (HCC)   5. Encounter for diabetic foot exam (HCC)     Plan: -Consent given for treatment as described below: -Examined patient. -Diabetic foot examination performed today. -Continue diabetic foot care principles: inspect feet daily, monitor glucose as recommended by PCP and/or Endocrinologist, and follow prescribed diet per PCP, Endocrinologist and/or dietician. -Continue supportive shoe gear daily. -Mycotic toenails 1-5 bilaterally were debrided in length and girth with sterile nail nippers and dremel without  incident. -Callus(es) bilateral great toes and 1st metatarsal head of both feet pared utilizing sterile scalpel blade without complication or incident. Total number debrided =4. -Patient/POA to call should there be question/concern in the interim. Return in about 3 months (around 09/17/2023).  Freddie Breech, DPM      [place] LOCATION: [address] [place] [address]                   Office [phone]   [facility] LOCATION: [address] [place], [place] [address] Office [phone]

## 2023-06-18 ENCOUNTER — Other Ambulatory Visit: Payer: Self-pay

## 2023-06-18 DIAGNOSIS — D509 Iron deficiency anemia, unspecified: Secondary | ICD-10-CM

## 2023-06-19 ENCOUNTER — Inpatient Hospital Stay: Payer: Medicare Other | Attending: Oncology

## 2023-06-19 ENCOUNTER — Inpatient Hospital Stay: Payer: Medicare Other

## 2023-06-19 ENCOUNTER — Inpatient Hospital Stay (HOSPITAL_BASED_OUTPATIENT_CLINIC_OR_DEPARTMENT_OTHER): Payer: Medicare Other | Admitting: Oncology

## 2023-06-19 ENCOUNTER — Encounter: Payer: Self-pay | Admitting: Oncology

## 2023-06-19 ENCOUNTER — Inpatient Hospital Stay: Payer: Medicare Other | Admitting: Oncology

## 2023-06-19 VITALS — BP 140/70 | HR 57 | Temp 96.2°F | Resp 20 | Wt 169.8 lb

## 2023-06-19 DIAGNOSIS — D649 Anemia, unspecified: Secondary | ICD-10-CM

## 2023-06-19 DIAGNOSIS — I129 Hypertensive chronic kidney disease with stage 1 through stage 4 chronic kidney disease, or unspecified chronic kidney disease: Secondary | ICD-10-CM | POA: Insufficient documentation

## 2023-06-19 DIAGNOSIS — D509 Iron deficiency anemia, unspecified: Secondary | ICD-10-CM

## 2023-06-19 DIAGNOSIS — D631 Anemia in chronic kidney disease: Secondary | ICD-10-CM | POA: Diagnosis not present

## 2023-06-19 DIAGNOSIS — Z7984 Long term (current) use of oral hypoglycemic drugs: Secondary | ICD-10-CM | POA: Insufficient documentation

## 2023-06-19 DIAGNOSIS — N183 Chronic kidney disease, stage 3 unspecified: Secondary | ICD-10-CM | POA: Diagnosis not present

## 2023-06-19 DIAGNOSIS — E1122 Type 2 diabetes mellitus with diabetic chronic kidney disease: Secondary | ICD-10-CM | POA: Insufficient documentation

## 2023-06-19 LAB — CBC
HCT: 36.5 % (ref 36.0–46.0)
Hemoglobin: 11.7 g/dL — ABNORMAL LOW (ref 12.0–15.0)
MCH: 29.8 pg (ref 26.0–34.0)
MCHC: 32.1 g/dL (ref 30.0–36.0)
MCV: 92.9 fL (ref 80.0–100.0)
Platelets: 104 10*3/uL — ABNORMAL LOW (ref 150–400)
RBC: 3.93 MIL/uL (ref 3.87–5.11)
RDW: 12.9 % (ref 11.5–15.5)
WBC: 5.2 10*3/uL (ref 4.0–10.5)
nRBC: 0 % (ref 0.0–0.2)

## 2023-06-19 LAB — IRON AND TIBC
Iron: 83 ug/dL (ref 28–170)
Saturation Ratios: 25 % (ref 10.4–31.8)
TIBC: 326 ug/dL (ref 250–450)
UIBC: 243 ug/dL

## 2023-06-19 LAB — FERRITIN: Ferritin: 38 ng/mL (ref 11–307)

## 2023-06-19 LAB — VITAMIN B12: Vitamin B-12: 841 pg/mL (ref 180–914)

## 2023-06-20 LAB — KAPPA/LAMBDA LIGHT CHAINS
Kappa free light chain: 58.5 mg/L — ABNORMAL HIGH (ref 3.3–19.4)
Kappa, lambda light chain ratio: 1.78 — ABNORMAL HIGH (ref 0.26–1.65)
Lambda free light chains: 32.9 mg/L — ABNORMAL HIGH (ref 5.7–26.3)

## 2023-06-23 ENCOUNTER — Encounter: Payer: Self-pay | Admitting: Oncology

## 2023-06-23 NOTE — Progress Notes (Signed)
Hematology/Oncology Consult note [facility]  Telephone:([phone] [phone]  Patient Care Team: Duanne Limerick, MD as PCP - General (Family Medicine) Creig Hines, MD as Consulting Physician (Oncology)   Name of the patient: Frances Harmon  [id number]  [birth year]   Date of visit: 06/23/23  Diagnosis-anemia of chronic kidney disease  Chief complaint/ Reason for visit-routine follow-up of anemia  Heme/Onc history: patient is a 77 year old female with a past medical history significant for,Hypertension hyperlipidemia type 2 diabetes stage III CKD who has been referred for anemia.  Most recent CBC 01/03/2022 showed white cell count of 4.5, H&H of 9.6/28.7 and a platelet count of 104.  Patient's hemoglobin back in October 2022 was normal at 12.2 and then gradually drifting down since then.  Platelets were 183 back in October 2022 and presently 104.  Most recent serum creatinine was 1.2.  Calcium and total protein was normal.  Iron studies showed a low ferritin of 8 on 01/02/2022.  Folate levels were normal at 16.7.B12 levels were low at 134.  B6 was normal.   Patient underwent surgery for diverticular stricture and large bowel obstruction in April 2022 at [facility] requiring exploratory laparotomy sigmoidectomy and ileostomy.  She underwent ileostomy revision in October 2022 which was complicated by recurrent enterocutaneous fistula for which she later underwent surgical repair  Interval history-currently patient is doing well and other than mild fatigue she denies other complaints at this time.  Denies any blood loss in the stool or urine.  ECOG PS- 1 Pain scale- 0   Review of systems- Review of Systems  Constitutional:  Positive for malaise/fatigue. Negative for chills, fever and weight loss.  HENT:  Negative for congestion, ear discharge and nosebleeds.   Eyes:  Negative for blurred vision.  Respiratory:  Negative for cough, hemoptysis, sputum  production, shortness of breath and wheezing.   Cardiovascular:  Negative for chest pain, palpitations, orthopnea and claudication.  Gastrointestinal:  Negative for abdominal pain, blood in stool, constipation, diarrhea, heartburn, melena, nausea and vomiting.  Genitourinary:  Negative for dysuria, flank pain, frequency, hematuria and urgency.  Musculoskeletal:  Negative for back pain, joint pain and myalgias.  Skin:  Negative for rash.  Neurological:  Negative for dizziness, tingling, focal weakness, seizures, weakness and headaches.  Endo/Heme/Allergies:  Does not bruise/bleed easily.  Psychiatric/Behavioral:  Negative for depression and suicidal ideas. The patient does not have insomnia.       No Known Allergies   Past Medical History:  Diagnosis Date   Diabetes mellitus without complication (HCC)    GERD (gastroesophageal reflux disease)    Hyperlipidemia    Hypertension      Past Surgical History:  Procedure Laterality Date   COLONOSCOPY  2009   repeat in 5 years- Dr Porfirio Mylar   COLONOSCOPY WITH PROPOFOL N/A 09/09/2015   Procedure: COLONOSCOPY WITH PROPOFOL;  Surgeon: Wallace Cullens, MD;  Location: [facility] ENDOSCOPY;  Service: Gastroenterology;  Laterality: N/A;   FOOT SURGERY Bilateral    Bunion removal   INCONTINENCE SURGERY     VAGINAL HYSTERECTOMY     VENTRAL HERNIA REPAIR N/A 05/06/2015   Procedure: HERNIA REPAIR VENTRAL ADULT;  Surgeon: Nadeen Landau, MD;  Location: [facility] ORS;  Service: General;  Laterality: N/A;    Social History   Socioeconomic History   Marital status: Married    Spouse name: Not on file   Number of children: 2   Years of education: Not on file   Highest  education level: 12th grade  Occupational History   Occupation: Retired    Comment: works part time  Tobacco Use   Smoking status: Never   Smokeless tobacco: Never   Tobacco comments:    smokin cessation materials not required  Vaping Use   Vaping status: Never Used  Substance and  Sexual Activity   Alcohol use: No    Alcohol/week: 0.0 standard drinks of alcohol   Drug use: No   Sexual activity: Not Currently  Other Topics Concern   Not on file  Social History Narrative   Not on file   Social Determinants of Health   Financial Resource Strain: Low Risk  (04/03/2023)   Overall Financial Resource Strain (CARDIA)    Difficulty of Paying Living Expenses: Not hard at all  Food Insecurity: No Food Insecurity (04/03/2023)   Hunger Vital Sign    Worried About Running Out of Food in the Last Year: Never true    Ran Out of Food in the Last Year: Never true  Transportation Needs: No Transportation Needs (04/03/2023)   PRAPARE - Administrator, Civil Service (Medical): No    Lack of Transportation (Non-Medical): No  Physical Activity: Sufficiently Active (04/03/2023)   Exercise Vital Sign    Days of Exercise per Week: 5 days    Minutes of Exercise per Session: 30 min  Stress: No Stress Concern Present (04/03/2023)   [employer] of Occupational Health - Occupational Stress Questionnaire    Feeling of Stress : Not at all  Social Connections: Moderately Integrated (04/03/2023)   Social Connection and Isolation Panel [NHANES]    Frequency of Communication with Friends and Family: Twice a week    Frequency of Social Gatherings with Friends and Family: Twice a week    Attends Religious Services: More than 4 times per year    Active Member of [employer] or Organizations: No    Attends Banker Meetings: Never    Marital Status: Married  Catering manager Violence: Not At Risk (04/03/2023)   Humiliation, Afraid, Rape, and Kick questionnaire    Fear of Current or Ex-Partner: No    Emotionally Abused: No    Physically Abused: No    Sexually Abused: No    Family History  Problem Relation Age of Onset   Breast cancer Daughter 73   Breast cancer Mother 58   Hypertension Brother    Diabetes Brother    Arthritis Brother      Current Outpatient  Medications:    Acetaminophen (TYLENOL) 325 MG CAPS, PRN, Disp: , Rfl:    amLODipine (NORVASC) 10 MG tablet, TAKE (1) TABLET BY MOUTH EVERY DAY, Disp: 90 tablet, Rfl: 1   atorvastatin (LIPITOR) 20 MG tablet, TAKE (1) TABLET BY MOUTH EVERY DAY, Disp: 90 tablet, Rfl: 1   carvedilol (COREG) 3.125 MG tablet, TAKE (1) TABLET BY MOUTH TWICE DAILY WITH A MEAL, Disp: 60 tablet, Rfl: 2   glipiZIDE (GLUCOTROL XL) 2.5 MG 24 hr tablet, Take 1 tablet by mouth every day, Disp: 90 tablet, Rfl: 1   losartan (COZAAR) 25 MG tablet, Take 25 mg by mouth daily., Disp: , Rfl:    metFORMIN (GLUCOPHAGE) 500 MG tablet, TAKE (1) TABLET BY MOUTH TWICE DAILY, Disp: 180 tablet, Rfl: 1   omeprazole (PRILOSEC) 20 MG capsule, TAKE (1) CAPSULE BY MOUTH EVERY MORNING, Disp: 90 capsule, Rfl: 0  Physical exam:  Vitals:   06/19/23 1127 06/19/23 1132  BP: (!) 175/77 (!) 140/70  Pulse: (!) 57  Resp: 20   Temp: (!) 96.2 F (35.7 C)   TempSrc: Tympanic   SpO2: 100%   Weight: 169 lb 12.8 oz (77 kg)    Physical Exam Cardiovascular:     Rate and Rhythm: Normal rate and regular rhythm.     Heart sounds: Normal heart sounds.  Pulmonary:     Effort: Pulmonary effort is normal.     Breath sounds: Normal breath sounds.  Skin:    General: Skin is warm and dry.  Neurological:     Mental Status: She is alert and oriented to person, place, and time.         Latest Ref Rng & Units 02/12/2022    4:00 PM  CMP  Glucose 70 - 99 mg/dL 92   BUN 8 - 27 mg/dL 21   Creatinine 1.61 - 1.00 mg/dL 0.96   Sodium 045 - 409 mmol/L 145   Potassium 3.5 - 5.2 mmol/L 4.8   Chloride 96 - 106 mmol/L 111   CO2 20 - 29 mmol/L 23   Calcium 8.7 - 10.3 mg/dL 9.5       Latest Ref Rng & Units 06/19/2023   11:14 AM  CBC  WBC 4.0 - 10.5 K/uL 5.2   Hemoglobin 12.0 - 15.0 g/dL 81.1   Hematocrit 91.4 - 46.0 % 36.5   Platelets 150 - 400 K/uL 104     Assessment and plan- Patient is a 77 y.o. female here for Routine follow-up of anemia of chronic  kidney disease  Patient's H&H has been stable between 11-12 over the last 2 years.  She has not required any EPO so far for her chronic kidney disease.  Ferritin levels are currently at 38 with an iron saturation of 25%.  We will consider offering her IV iron to keep her hemoglobin more than 50 if possible.  Repeat CBC ferritin and iron studies in 6 months in 1 year and I will see her back in 1 year.  Patient was noted to have a mildly elevated kappa lambda light chain ratio but both kappa and lambda light chains were elevated likely in the setting of CKD which can be monitored conservatively on a yearly basis   Visit Diagnosis 1. Iron deficiency anemia, unspecified iron deficiency anemia type   2. Anemia of chronic kidney failure, stage 3 (moderate) (HCC)      Dr. Owens Shark, MD, MPH [facility] at [facility] [phone] 06/23/2023 8:21 PM

## 2023-08-05 ENCOUNTER — Other Ambulatory Visit: Payer: Self-pay | Admitting: Family Medicine

## 2023-08-05 DIAGNOSIS — E7801 Familial hypercholesterolemia: Secondary | ICD-10-CM

## 2023-08-06 ENCOUNTER — Telehealth: Payer: Self-pay | Admitting: Family Medicine

## 2023-08-06 NOTE — Telephone Encounter (Signed)
 Requested yesterday- pending provider review- labs expired

## 2023-08-06 NOTE — Telephone Encounter (Signed)
 Requested medications are due for refill today.  yes  Requested medications are on the active medications list.  yes  Last refill. 01/14/2023 #90 1 rf  Future visit scheduled.   yes  Notes to clinic.  Labs are expired.    Requested Prescriptions  Pending Prescriptions Disp Refills   atorvastatin  (LIPITOR) 20 MG tablet [Pharmacy Med Name: ATORVASTATIN  CALCIUM  20MG  TABLET] 90 tablet 1    Sig: TAKE (1) TABLET BY MOUTH EVERY DAY     Cardiovascular:  Antilipid - Statins Failed - 08/06/2023  2:08 PM      Failed - Lipid Panel in normal range within the last 12 months    Cholesterol, Total  Date Value Ref Range Status  09/01/2021 128 100 - 199 mg/dL Final   LDL Chol Calc (NIH)  Date Value Ref Range Status  09/01/2021 53 0 - 99 mg/dL Final   HDL  Date Value Ref Range Status  09/01/2021 59 >39 mg/dL Final   Triglycerides  Date Value Ref Range Status  09/01/2021 82 0 - 149 mg/dL Final         Passed - Patient is not pregnant      Passed - Valid encounter within last 12 months    Recent Outpatient Visits           3 months ago Type 2 diabetes mellitus without complication, without long-term current use of insulin  (HCC)   [place] [facility] Joshua Cathryne JAYSON, MD   8 months ago Primary hypertension   [place] [facility] Joshua Cathryne JAYSON, MD   11 months ago Acute maxillary sinusitis, recurrence not specified   [place] [facility] Joshua Cathryne JAYSON, MD   1 year ago Type 2 diabetes mellitus without complication, without long-term current use of insulin  [facility])   [place] [facility] Joshua Cathryne JAYSON, MD   1 year ago Primary hypertension   [place] [facility] Joshua Cathryne JAYSON, MD       Future Appointments             In 1 week Joshua Cathryne JAYSON, MD [facility] at [facility], [facility]

## 2023-08-06 NOTE — Telephone Encounter (Signed)
 Medication Refill -  Most Recent Primary Care Visit:  Provider: JOSHUA CATHRYNE BROCKS  Department: [facility]  Visit Type: OFFICE VISIT  Date: 04/11/2023  Medication: atorvastatin  (LIPITOR) 20 MG tablet   Has the patient contacted their pharmacy? Yes  (Agent: If yes, when and what did the pharmacy advise?) Pharmacy says they sent a request but hasn't heard back   Is this the correct pharmacy for this prescription? Yes  This is the patient's preferred pharmacy:  [facility] - [place], [place] - [address] [place] [address] Phone: [phone] Fax: [phone]   Has the prescription been filled recently? Yes  Is the patient out of the medication? Yes  Has the patient been seen for an appointment in the last year OR does the patient have an upcoming appointment? Yes  Can we respond through MyChart? No  Agent: Please be advised that Rx refills may take up to 3 business days. We ask that you follow-up with your pharmacy.

## 2023-08-15 ENCOUNTER — Encounter: Payer: Self-pay | Admitting: Family Medicine

## 2023-08-15 ENCOUNTER — Ambulatory Visit: Payer: Medicare Other | Admitting: Family Medicine

## 2023-08-15 VITALS — BP 160/90 | HR 60 | Ht 62.0 in | Wt 174.0 lb

## 2023-08-15 DIAGNOSIS — I10 Essential (primary) hypertension: Secondary | ICD-10-CM

## 2023-08-15 DIAGNOSIS — E7801 Familial hypercholesterolemia: Secondary | ICD-10-CM

## 2023-08-15 DIAGNOSIS — K219 Gastro-esophageal reflux disease without esophagitis: Secondary | ICD-10-CM | POA: Diagnosis not present

## 2023-08-15 DIAGNOSIS — E119 Type 2 diabetes mellitus without complications: Secondary | ICD-10-CM

## 2023-08-15 DIAGNOSIS — Z7984 Long term (current) use of oral hypoglycemic drugs: Secondary | ICD-10-CM | POA: Diagnosis not present

## 2023-08-15 MED ORDER — OMEPRAZOLE 20 MG PO CPDR: DELAYED_RELEASE_CAPSULE | ORAL | 1 refills | Status: AC

## 2023-08-15 MED ORDER — ATORVASTATIN CALCIUM 20 MG PO TABS: ORAL_TABLET | ORAL | 1 refills | Status: AC

## 2023-08-15 MED ORDER — GLIPIZIDE ER 2.5 MG PO TB24: ORAL_TABLET | ORAL | 1 refills | Status: AC

## 2023-08-15 MED ORDER — METFORMIN HCL 500 MG PO TABS: ORAL_TABLET | ORAL | 1 refills | Status: AC

## 2023-08-15 MED ORDER — CARVEDILOL 6.25 MG PO TABS: 6.2500 mg | ORAL_TABLET | Freq: Two times a day (BID) | ORAL | 0 refills | Status: DC

## 2023-08-15 MED ORDER — AMLODIPINE BESYLATE 10 MG PO TABS: ORAL_TABLET | ORAL | 1 refills | Status: DC

## 2023-08-15 NOTE — Progress Notes (Signed)
Date:  08/15/2023   Name:  Frances Harmon   DOB:  08-22-1945   MRN:  [id number]   Chief Complaint: Diabetes, Hyperlipidemia, Gastroesophageal Reflux, and Hypertension  Diabetes She presents for her follow-up diabetic visit. She has type 2 diabetes mellitus. Her disease course has been stable. There are no hypoglycemic associated symptoms. Pertinent negatives for hypoglycemia include no headaches or sweats. There are no diabetic associated symptoms. Pertinent negatives for diabetes include no blurred vision, no chest pain, no fatigue, no foot paresthesias, no foot ulcerations, no polydipsia, no polyphagia, no polyuria, no visual change, no weakness and no weight loss. There are no hypoglycemic complications. Symptoms are stable. There are no diabetic complications. Risk factors for coronary artery disease include dyslipidemia. Current diabetic treatment includes oral agent (dual therapy). She is compliant with treatment all of the time. She is following a generally healthy diet. Meal planning includes avoidance of concentrated sweets and carbohydrate counting. She participates in exercise daily (walking). There is no change in her home blood glucose trend. An ACE inhibitor/angiotensin II receptor blocker is not being taken.  Hyperlipidemia This is a chronic problem. The current episode started more than 1 year ago. The problem is controlled. Recent lipid tests were reviewed and are normal. She has no history of chronic renal disease. Pertinent negatives include no chest pain or shortness of breath. Current antihyperlipidemic treatment includes statins. There are no compliance problems.  Risk factors for coronary artery disease include diabetes mellitus, dyslipidemia and hypertension.  Gastroesophageal Reflux She reports no abdominal pain, no belching, no chest pain, no choking, no coughing, no dysphagia, no heartburn, no hoarse voice, no nausea, no sore throat, no stridor or no wheezing. This is a  chronic problem. The current episode started more than 1 year ago. The problem has been gradually improving. Pertinent negatives include no fatigue or weight loss. She has tried a PPI for the symptoms. The treatment provided moderate relief.  Hypertension This is a chronic problem. The current episode started more than 1 year ago. The problem has been waxing and waning since onset. The problem is uncontrolled. Pertinent negatives include no blurred vision, chest pain, headaches, malaise/fatigue, neck pain, orthopnea, palpitations, peripheral edema, PND, shortness of breath or sweats. There are no associated agents to hypertension. There are no known risk factors for coronary artery disease. There is no history of chronic renal disease.    Lab Results  Component Value Date   NA 145 (H) 02/12/2022   K 4.8 02/12/2022   CO2 23 02/12/2022   GLUCOSE 92 02/12/2022   BUN 21 02/12/2022   CREATININE 1.43 (H) 02/12/2022   CALCIUM 9.5 02/12/2022   EGFR 38 (L) 02/12/2022   GFRNONAA >60 07/27/2020   Lab Results  Component Value Date   CHOL 128 09/01/2021   HDL 59 09/01/2021   LDLCALC 53 09/01/2021   TRIG 82 09/01/2021   CHOLHDL 2.4 09/12/2017   No results found for: "TSH" Lab Results  Component Value Date   HGBA1C 5.5 04/11/2023   Lab Results  Component Value Date   WBC 5.2 06/19/2023   HGB 11.7 (L) 06/19/2023   HCT 36.5 06/19/2023   MCV 92.9 06/19/2023   PLT 104 (L) 06/19/2023   Lab Results  Component Value Date   ALT 11 10/22/2019   AST 13 10/22/2019   ALKPHOS 90 10/22/2019   BILITOT 0.5 10/22/2019   No results found for: "25OHVITD2", "25OHVITD3", "VD25OH"   Review of Systems  Constitutional:  Negative for  fatigue, malaise/fatigue and weight loss.  HENT:  Negative for congestion, hoarse voice, postnasal drip, rhinorrhea, sinus pressure and sore throat.   Eyes:  Negative for blurred vision and visual disturbance.  Respiratory:  Negative for cough, choking, chest tightness,  shortness of breath and wheezing.   Cardiovascular:  Negative for chest pain, palpitations, orthopnea, leg swelling and PND.  Gastrointestinal:  Negative for abdominal pain, blood in stool, dysphagia, heartburn and nausea.  Endocrine: Negative for polydipsia, polyphagia and polyuria.  Genitourinary:  Negative for dysuria.  Musculoskeletal:  Negative for neck pain.  Neurological:  Negative for weakness and headaches.    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   Physical deconditioning 11/17/2020   Large bowel obstruction (HCC) 11/14/2020   Shock circulatory (HCC) 11/05/2020   Sepsis (HCC) 11/05/2020   Mesenteric ischemia (HCC) 11/05/2020   Acute respiratory failure with hypoxia (HCC) 11/05/2020   H/O total hysterectomy with bilateral salpingo-oophorectomy (BSO) 09/06/2020   Chronic kidney disease 07/23/2020   Thrombocytopenia (HCC) 04/23/2019   Type 2 diabetes mellitus with complication, without long-term current use of insulin (HCC) 04/28/2015   Essential hypertension 04/28/2015   Hyperlipidemia 04/28/2015   Esophageal reflux 04/28/2015   Vitamin D deficiency 04/28/2015    No Known Allergies  Past Surgical History:  Procedure Laterality Date   COLONOSCOPY  2009   repeat in 5 years- Dr Porfirio Mylar   COLONOSCOPY WITH PROPOFOL N/A 09/09/2015   Procedure: COLONOSCOPY WITH PROPOFOL;  Surgeon: Wallace Cullens, MD;  Location: [facility] ENDOSCOPY;  Service: Gastroenterology;  Laterality: N/A;   FOOT SURGERY Bilateral    Bunion removal   INCONTINENCE SURGERY     VAGINAL HYSTERECTOMY     VENTRAL HERNIA  REPAIR N/A 05/06/2015   Procedure: HERNIA REPAIR VENTRAL ADULT;  Surgeon: Nadeen Landau, MD;  Location: [facility] ORS;  Service: General;  Laterality: N/A;    Social History   Tobacco Use   Smoking status: Never   Smokeless tobacco: Never   Tobacco comments:    smokin cessation materials not required  Vaping Use   Vaping status: Never Used  Substance Use Topics   Alcohol use: No    Alcohol/week: 0.0 standard drinks of alcohol   Drug use: No     Medication list has been reviewed and updated.  Current Meds  Medication Sig   Acetaminophen (TYLENOL) 325 MG CAPS PRN   amLODipine (NORVASC) 10 MG tablet TAKE (1) TABLET BY MOUTH EVERY DAY   atorvastatin (LIPITOR) 20 MG tablet TAKE (1) TABLET BY MOUTH EVERY DAY   carvedilol (COREG) 3.125 MG tablet TAKE (1) TABLET BY MOUTH TWICE DAILY WITH A MEAL   Cyanocobalamin (VITAMIN B-12 PO) Take by mouth.   glipiZIDE (GLUCOTROL XL) 2.5 MG 24 hr tablet Take 1 tablet by mouth every day   losartan (COZAAR) 25 MG tablet Take 25 mg by mouth daily.   MAGNESIUM PO Take by mouth.   metFORMIN (GLUCOPHAGE) 500 MG tablet TAKE (1) TABLET BY MOUTH TWICE DAILY   omeprazole (PRILOSEC) 20 MG capsule TAKE (1) CAPSULE BY MOUTH EVERY MORNING       04/11/2023    2:28 PM 09/10/2022  3:55 PM 07/30/2022    2:53 PM 03/30/2022   10:50 AM  GAD 7 : Generalized Anxiety Score  Nervous, Anxious, on Edge 0 0 0 0  Control/stop worrying 0 0 0 0  Worry too much - different things 0 0 0 0  Trouble relaxing 0 0 0 0  Restless 0 0 0 0  Easily annoyed or irritable 0 0 0 0  Afraid - awful might happen 0 0 0 0  Total GAD 7 Score 0 0 0 0  Anxiety Difficulty Not difficult at all Not difficult at all Not difficult at all Not difficult at all       04/11/2023    2:27 PM 04/03/2023    2:58 PM 09/10/2022    3:55 PM  Depression screen PHQ 2/9  Decreased Interest 0 0 0  Down, Depressed, Hopeless 0 0 0  PHQ - 2 Score 0 0 0  Altered sleeping 0 0 0  Tired, decreased energy 0 0 0   Change in appetite 0 0 0  Feeling bad or failure about yourself  0 0 0  Trouble concentrating 0 0 0  Moving slowly or fidgety/restless 0 0 0  Suicidal thoughts 0 0 0  PHQ-9 Score 0 0 0  Difficult doing work/chores Not difficult at all Not difficult at all Not difficult at all    BP Readings from Last 3 Encounters:  08/15/23 (!) 172/90  06/19/23 (!) 140/70  04/11/23 128/60    Physical Exam Vitals and nursing note reviewed.  Constitutional:      General: She is not in acute distress.    Appearance: She is not diaphoretic.  HENT:     Head: Normocephalic and atraumatic.     Right Ear: External ear normal.     Left Ear: External ear normal.     Nose: Nose normal.  Eyes:     General:        Right eye: No discharge.        Left eye: No discharge.     Conjunctiva/sclera: Conjunctivae normal.     Pupils: Pupils are equal, round, and reactive to light.  Neck:     Thyroid: No thyromegaly.     Vascular: No JVD.  Cardiovascular:     Rate and Rhythm: Normal rate and regular rhythm.     Heart sounds: Normal heart sounds. No murmur heard.    No friction rub. No gallop.  Pulmonary:     Effort: Pulmonary effort is normal.     Breath sounds: Normal breath sounds.  Abdominal:     General: Bowel sounds are normal.     Palpations: Abdomen is soft. There is no mass.     Tenderness: There is no abdominal tenderness. There is no guarding.  Musculoskeletal:        General: Normal range of motion.     Cervical back: Normal range of motion and neck supple.  Lymphadenopathy:     Cervical: No cervical adenopathy.  Skin:    General: Skin is warm and dry.  Neurological:     Mental Status: She is alert.     Deep Tendon Reflexes: Reflexes are normal and symmetric.     Wt Readings from Last 3 Encounters:  08/15/23 174 lb (78.9 kg)  06/19/23 169 lb 12.8 oz (77 kg)  06/17/23 170 lb (77.1 kg)    BP (!) 172/90   Pulse 60   Ht 5\' 2"  (1.575 m)   Wt 174 lb (78.9 kg)  SpO2 97%   BMI  31.83 kg/m   Assessment and Plan: 1. Essential hypertension (Primary) Chronic.  Controlled.  Stable.  Asymptomatic.  Tolerating medications well.  Blood pressure today is noted on second reading 160/90.  This is with the larger of the cuff send total exposure of the entire proximal arm.  Will continue amlodipine 10 mg once a day which is max but we have increased the carvedilol to 6.25 she is followed by Dr. Lourdes Sledge and this may be rechecked but we will going to have her come back in 2 weeks and recheck her on the new dosing.  If patient experiences any chest pain shortness of breath increase she has been instructed to go to medical setting as soon as possible including our office or elsewhere. - amLODipine (NORVASC) 10 MG tablet; TAKE (1) TABLET BY MOUTH EVERY DAY  Dispense: 90 tablet; Refill: 1 - carvedilol (COREG) 6.25 MG tablet; Take 1 tablet (6.25 mg total) by mouth 2 (two) times daily with a meal.  Dispense: 60 tablet; Refill: 0  2. GERD without esophagitis Chronic.  Controlled.  Stable.  Asymptomatic.  Tolerating omeprazole 20 mg once a day.  And this will be continued at current dosing. - omeprazole (PRILOSEC) 20 MG capsule; TAKE (1) CAPSULE BY MOUTH EVERY MORNING  Dispense: 90 capsule; Refill: 1  3. Familial hypercholesterolemia Chronic.  Controlled.  Stable.  Asymptomatic.  No episodes of myalgias or muscle weakness.  Will continue current statin dosing of atorvastatin 20 mg once a day.  Will recheck lipid panel in 2 weeks when returns for blood pressure check. - atorvastatin (LIPITOR) 20 MG tablet; TAKE (1) TABLET BY MOUTH EVERY DAY  Dispense: 90 tablet; Refill: 1  4. Type 2 diabetes mellitus without complication, without long-term current use of insulin (HCC) Chronic.  Controlled.  Stable.  Asymptomatic.  No polyuria and polydipsia polyphagia.  Will continue glipizide XL 2.5 mg once a day and metformin 500 mg twice a day.  Will recheck patient in 4 months for her diabetes if she has not  reestablished with a  new physician. - glipiZIDE (GLUCOTROL XL) 2.5 MG 24 hr tablet; Take 1 tablet by mouth every day  Dispense: 90 tablet; Refill: 1 - metFORMIN (GLUCOPHAGE) 500 MG tablet; TAKE (1) TABLET BY MOUTH TWICE DAILY  Dispense: 180 tablet; Refill: 1    Elizabeth Sauer, MD

## 2023-08-29 ENCOUNTER — Ambulatory Visit: Payer: Medicare Other | Admitting: Family Medicine

## 2023-08-30 ENCOUNTER — Encounter: Payer: Self-pay | Admitting: Family Medicine

## 2023-08-30 ENCOUNTER — Ambulatory Visit: Payer: Medicare Other | Admitting: Family Medicine

## 2023-08-30 VITALS — BP 134/70 | HR 78 | Ht 62.0 in | Wt 174.0 lb

## 2023-08-30 DIAGNOSIS — I10 Essential (primary) hypertension: Secondary | ICD-10-CM

## 2023-08-30 MED ORDER — AMLODIPINE BESYLATE 10 MG PO TABS: ORAL_TABLET | ORAL | 1 refills | Status: AC

## 2023-08-30 MED ORDER — CARVEDILOL 6.25 MG PO TABS: 6.2500 mg | ORAL_TABLET | Freq: Two times a day (BID) | ORAL | 1 refills | Status: AC

## 2023-08-30 NOTE — Progress Notes (Signed)
 Date:  08/30/2023   Name:  Frances Harmon   DOB:  03-Dec-1945   MRN:  [id number]   Chief Complaint: Hypertension  Hypertension This is a chronic problem. The current episode started more than 1 year ago. The problem has been gradually improving since onset. The problem is controlled. Pertinent negatives include no anxiety, blurred vision, chest pain, headaches, malaise/fatigue, neck pain, orthopnea, palpitations, peripheral edema, PND, shortness of breath or sweats. There are no associated agents to hypertension. There are no known risk factors for coronary artery disease. Past treatments include beta blockers, alpha 1 blockers and calcium  channel blockers (amlodipine  and carvedalol). The current treatment provides moderate improvement. There are no compliance problems.  There is no history of CAD/MI or CVA. There is no history of chronic renal disease, a hypertension causing med or renovascular disease.    Lab Results  Component Value Date   NA 145 (H) 02/12/2022   K 4.8 02/12/2022   CO2 23 02/12/2022   GLUCOSE 92 02/12/2022   BUN 21 02/12/2022   CREATININE 1.43 (H) 02/12/2022   CALCIUM  9.5 02/12/2022   EGFR 38 (L) 02/12/2022   GFRNONAA >60 07/27/2020   Lab Results  Component Value Date   CHOL 128 09/01/2021   HDL 59 09/01/2021   LDLCALC 53 09/01/2021   TRIG 82 09/01/2021   CHOLHDL 2.4 09/12/2017   No results found for: TSH Lab Results  Component Value Date   HGBA1C 5.5 04/11/2023   Lab Results  Component Value Date   WBC 5.2 06/19/2023   HGB 11.7 (L) 06/19/2023   HCT 36.5 06/19/2023   MCV 92.9 06/19/2023   PLT 104 (L) 06/19/2023   Lab Results  Component Value Date   ALT 11 10/22/2019   AST 13 10/22/2019   ALKPHOS 90 10/22/2019   BILITOT 0.5 10/22/2019   No results found for: 25OHVITD2, 25OHVITD3, VD25OH   Review of Systems  Constitutional:  Negative for chills, fatigue, malaise/fatigue and unexpected weight change.  HENT:  Negative for trouble  swallowing.   Eyes:  Negative for blurred vision and visual disturbance.  Respiratory:  Negative for cough, choking, chest tightness, shortness of breath and wheezing.   Cardiovascular:  Negative for chest pain, palpitations, orthopnea, leg swelling and PND.  Gastrointestinal:  Negative for abdominal pain.  Musculoskeletal:  Negative for neck pain.  Neurological:  Negative for headaches.    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   Acute respiratory failure with hypoxia (HCC) 11/05/2020   H/O total hysterectomy with bilateral salpingo-oophorectomy (BSO) 09/06/2020   Chronic kidney disease 07/23/2020   Thrombocytopenia (HCC) 04/23/2019   Type 2 diabetes mellitus with complication, without long-term current use of insulin  (HCC) 04/28/2015   Essential hypertension 04/28/2015   Hyperlipidemia 04/28/2015   Esophageal reflux 04/28/2015   Vitamin D deficiency 04/28/2015    No Known Allergies  Past Surgical History:  Procedure Laterality Date   COLONOSCOPY  2009   repeat in 5  years- Dr Waylan   COLONOSCOPY WITH PROPOFOL  N/A 09/09/2015   Procedure: COLONOSCOPY WITH PROPOFOL ;  Surgeon: Deward CINDERELLA Piedmont, MD;  Location: [facility] ENDOSCOPY;  Service: Gastroenterology;  Laterality: N/A;   FOOT SURGERY Bilateral    Bunion removal   INCONTINENCE SURGERY     VAGINAL HYSTERECTOMY     VENTRAL HERNIA REPAIR N/A 05/06/2015    Procedure: HERNIA REPAIR VENTRAL ADULT;  Surgeon: Larinda Unknown Sharps, MD;  Location: [facility] ORS;  Service: General;  Laterality: N/A;    Social History   Tobacco Use   Smoking status: Never   Smokeless tobacco: Never   Tobacco comments:    smokin cessation materials not required  Vaping Use   Vaping status: Never Used  Substance Use Topics   Alcohol use: No    Alcohol/week: 0.0 standard drinks of alcohol   Drug use: No     Medication list has been reviewed and updated.  Current Meds  Medication Sig   Acetaminophen  (TYLENOL ) 325 MG CAPS PRN   amLODipine  (NORVASC ) 10 MG tablet TAKE (1) TABLET BY MOUTH EVERY DAY   atorvastatin  (LIPITOR) 20 MG tablet TAKE (1) TABLET BY MOUTH EVERY DAY   carvedilol  (COREG ) 6.25 MG tablet Take 1 tablet (6.25 mg total) by mouth 2 (two) times daily with a meal.   Cyanocobalamin  (VITAMIN B-12 PO) Take by mouth.   glipiZIDE  (GLUCOTROL  XL) 2.5 MG 24 hr tablet Take 1 tablet by mouth every day   losartan  (COZAAR ) 25 MG tablet Take 25 mg by mouth daily.   MAGNESIUM PO Take by mouth.   metFORMIN  (GLUCOPHAGE ) 500 MG tablet TAKE (1) TABLET BY MOUTH TWICE DAILY   omeprazole  (PRILOSEC) 20 MG capsule TAKE (1) CAPSULE BY MOUTH EVERY MORNING       04/11/2023    2:28 PM 09/10/2022    3:55 PM 07/30/2022    2:53 PM 03/30/2022   10:50 AM  GAD 7 : Generalized Anxiety Score  Nervous, Anxious, on Edge 0 0 0 0  Control/stop worrying 0 0 0 0  Worry too much - different things 0 0 0 0  Trouble relaxing 0 0 0 0  Restless 0 0 0 0  Easily annoyed or irritable 0 0 0 0  Afraid - awful might happen 0 0 0 0  Total GAD 7 Score 0 0 0 0  Anxiety Difficulty Not difficult at all Not difficult at all Not difficult at all Not difficult at all       04/11/2023    2:27 PM 04/03/2023    2:58 PM 09/10/2022    3:55 PM  Depression screen PHQ 2/9  Decreased Interest 0 0 0  Down, Depressed, Hopeless 0 0 0  PHQ - 2 Score 0 0 0  Altered sleeping 0 0 0  Tired, decreased energy 0 0 0   Change in appetite 0 0 0  Feeling bad or failure about yourself  0 0 0  Trouble concentrating 0 0 0  Moving slowly or fidgety/restless 0 0 0  Suicidal thoughts 0 0 0  PHQ-9 Score 0 0 0  Difficult doing work/chores Not difficult at all Not difficult at all Not difficult at all    BP Readings from Last 3 Encounters:  08/30/23 134/70  08/15/23 (!) 160/90  06/19/23 (!) 140/70    Physical Exam Vitals and nursing note reviewed.  Constitutional:      General: She is not in acute distress.    Appearance: She is not diaphoretic.  HENT:  Head: Normocephalic and atraumatic.     Right Ear: Tympanic membrane, ear canal and external ear normal.     Left Ear: Tympanic membrane, ear canal and external ear normal.     Nose: Nose normal. No congestion or rhinorrhea.     Mouth/Throat:     Mouth: Mucous membranes are moist.     Pharynx: No oropharyngeal exudate or posterior oropharyngeal erythema.  Eyes:     General:        Right eye: No discharge.        Left eye: No discharge.     Conjunctiva/sclera: Conjunctivae normal.     Pupils: Pupils are equal, round, and reactive to light.  Neck:     Thyroid: No thyromegaly.     Vascular: No JVD.  Cardiovascular:     Rate and Rhythm: Normal rate and regular rhythm.     Heart sounds: Normal heart sounds. No murmur heard.    No friction rub. No gallop.  Pulmonary:     Effort: Pulmonary effort is normal.     Breath sounds: Normal breath sounds.  Abdominal:     General: Bowel sounds are normal.     Palpations: Abdomen is soft. There is no mass.     Tenderness: There is no abdominal tenderness. There is no guarding.  Musculoskeletal:        General: Normal range of motion.     Cervical back: Normal range of motion and neck supple.  Lymphadenopathy:     Cervical: No cervical adenopathy.  Skin:    General: Skin is warm and dry.  Neurological:     Mental Status: She is alert.     Deep Tendon Reflexes: Reflexes are normal and symmetric.      Wt Readings from Last 3 Encounters:  08/30/23 174 lb (78.9 kg)  08/15/23 174 lb (78.9 kg)  06/19/23 169 lb 12.8 oz (77 kg)    BP 134/70   Pulse 78   Ht 5' 2 (1.575 m)   Wt 174 lb (78.9 kg)   SpO2 97%   BMI 31.83 kg/m   Assessment and Plan:  1. Essential hypertension (Primary) Chronic.  Controlled.  Stable.  Recently increased carvedilol  from 3.125-6.25.  New blood pressure reading is 134/70 and we will continue at current dosing.  We will also continue amlodipine  10 mg once a day.  And patient is to return in 6 months for blood pressure recheck. - carvedilol  (COREG ) 6.25 MG tablet; Take 1 tablet (6.25 mg total) by mouth 2 (two) times daily with a meal.  Dispense: 180 tablet; Refill: 1 - amLODipine  (NORVASC ) 10 MG tablet; TAKE (1) TABLET BY MOUTH EVERY DAY  Dispense: 90 tablet; Refill: 1    Cathryne Molt, MD

## 2023-09-19 ENCOUNTER — Ambulatory Visit: Payer: Medicare Other | Admitting: Podiatry

## 2023-09-19 ENCOUNTER — Encounter: Payer: Self-pay | Admitting: Podiatry

## 2023-09-19 DIAGNOSIS — M79674 Pain in right toe(s): Secondary | ICD-10-CM | POA: Diagnosis not present

## 2023-09-19 DIAGNOSIS — B351 Tinea unguium: Secondary | ICD-10-CM

## 2023-09-19 DIAGNOSIS — E0843 Diabetes mellitus due to underlying condition with diabetic autonomic (poly)neuropathy: Secondary | ICD-10-CM | POA: Diagnosis not present

## 2023-09-19 DIAGNOSIS — M79675 Pain in left toe(s): Secondary | ICD-10-CM | POA: Diagnosis not present

## 2023-09-19 DIAGNOSIS — L84 Corns and callosities: Secondary | ICD-10-CM | POA: Diagnosis not present

## 2023-09-22 NOTE — Progress Notes (Signed)
  Subjective:  Patient ID: Frances Harmon, female    DOB: 1945/12/03,  MRN: [id number]  TYEISHA DINAN presents to clinic today for at risk foot care with history of diabetic neuropathy and callus(es) right foot and painful thick toenails that are difficult to trim. Painful toenails interfere with ambulation. Aggravating factors include wearing enclosed shoe gear. Pain is relieved with periodic professional debridement. Painful calluses are aggravated when weightbearing with and without shoegear. Pain is relieved with periodic professional debridement.  New problem(s): None.   PCP is Duanne Limerick, MD.  No Known Allergies  Review of Systems: Negative except as noted in the HPI.  Objective: No changes noted in today's physical examination. There were no vitals filed for this visit. Frances Harmon is a pleasant 78 y.o. female in NAD. AAO x 3.  Vascular Examination: Capillary refill time immediate b/l. Vascular status intact b/l with palpable pedal pulses. Pedal hair present b/l. No pain with calf compression b/l. Skin temperature gradient WNL b/l. No cyanosis or clubbing b/l. No ischemia or gangrene noted b/l.   Neurological Examination: Sensation grossly intact b/l with 10 gram monofilament. Vibratory sensation intact b/l.   Dermatological Examination: Pedal skin with normal turgor, texture and tone b/l.  No open wounds. No interdigital macerations.   Toenails 1-5 b/l thick, discolored, elongated with subungual debris and pain on dorsal palpation.   Hyperkeratotic lesion(s) R hallux and 1st metatarsal head right foot.  No erythema, no edema, no drainage, no fluctuance.  Musculoskeletal Examination: Muscle strength 5/5 to all lower extremity muscle groups bilaterally. Clawtoe deformity R 2nd toe.  Radiographs: None  Last A1c:      Latest Ref Rng & Units 04/11/2023    3:02 PM 11/29/2022    3:53 PM  Hemoglobin A1C  Hemoglobin-A1c 4.8 - 5.6 % 5.5  5.2      Assessment/Plan: 1. Pain due to onychomycosis of toenails of both feet   2. Callus   3. Diabetes mellitus due to underlying condition with diabetic autonomic neuropathy, unspecified whether long term insulin use (HCC)     Patient was evaluated and treated. All patient's and/or POA's questions/concerns addressed on today's visit. Toenails 1-5 debrided in length and girth without incident. Callus(es) R hallux and 1st metatarsal head right foot pared with sharp debridement without incident. Continue daily foot inspections and monitor blood glucose per PCP/Endocrinologist's recommendations. Continue soft, supportive shoe gear daily. Report any pedal injuries to medical professional. Call office if there are any questions/concerns. -Patient/POA to call should there be question/concern in the interim.   Return in about 3 months (around 12/17/2023).  Freddie Breech, DPM      [place] LOCATION: [address] [place] [address]                   Office [phone]   [facility] LOCATION: [address] [place], [place] [address] Office [phone]

## 2023-09-23 DIAGNOSIS — N1832 Chronic kidney disease, stage 3b: Secondary | ICD-10-CM | POA: Diagnosis not present

## 2023-09-23 DIAGNOSIS — I10 Essential (primary) hypertension: Secondary | ICD-10-CM | POA: Diagnosis not present

## 2023-09-23 DIAGNOSIS — D631 Anemia in chronic kidney disease: Secondary | ICD-10-CM | POA: Diagnosis not present

## 2023-09-23 DIAGNOSIS — E1122 Type 2 diabetes mellitus with diabetic chronic kidney disease: Secondary | ICD-10-CM | POA: Diagnosis not present

## 2023-09-30 DIAGNOSIS — E1122 Type 2 diabetes mellitus with diabetic chronic kidney disease: Secondary | ICD-10-CM | POA: Diagnosis not present

## 2023-09-30 DIAGNOSIS — N2581 Secondary hyperparathyroidism of renal origin: Secondary | ICD-10-CM | POA: Diagnosis not present

## 2023-09-30 DIAGNOSIS — I10 Essential (primary) hypertension: Secondary | ICD-10-CM | POA: Diagnosis not present

## 2023-09-30 DIAGNOSIS — D631 Anemia in chronic kidney disease: Secondary | ICD-10-CM | POA: Diagnosis not present

## 2023-09-30 DIAGNOSIS — N1832 Chronic kidney disease, stage 3b: Secondary | ICD-10-CM | POA: Diagnosis not present

## 2023-10-01 DIAGNOSIS — E538 Deficiency of other specified B group vitamins: Secondary | ICD-10-CM | POA: Diagnosis not present

## 2023-10-01 DIAGNOSIS — R5381 Other malaise: Secondary | ICD-10-CM | POA: Diagnosis not present

## 2023-10-01 DIAGNOSIS — E782 Mixed hyperlipidemia: Secondary | ICD-10-CM | POA: Diagnosis not present

## 2023-10-01 DIAGNOSIS — E559 Vitamin D deficiency, unspecified: Secondary | ICD-10-CM | POA: Diagnosis not present

## 2023-10-01 DIAGNOSIS — R5383 Other fatigue: Secondary | ICD-10-CM | POA: Diagnosis not present

## 2023-10-01 DIAGNOSIS — N1832 Chronic kidney disease, stage 3b: Secondary | ICD-10-CM | POA: Diagnosis not present

## 2023-10-01 DIAGNOSIS — E1122 Type 2 diabetes mellitus with diabetic chronic kidney disease: Secondary | ICD-10-CM | POA: Diagnosis not present

## 2023-10-01 DIAGNOSIS — I129 Hypertensive chronic kidney disease with stage 1 through stage 4 chronic kidney disease, or unspecified chronic kidney disease: Secondary | ICD-10-CM | POA: Diagnosis not present

## 2023-11-11 ENCOUNTER — Other Ambulatory Visit: Payer: Self-pay | Admitting: Family Medicine

## 2023-11-11 DIAGNOSIS — E119 Type 2 diabetes mellitus without complications: Secondary | ICD-10-CM

## 2023-11-12 NOTE — Telephone Encounter (Signed)
 Requested Prescriptions  Refused Prescriptions Disp Refills   metFORMIN  (GLUCOPHAGE ) 500 MG tablet [Pharmacy Med Name: METFORMIN  HYDROCHLORIDE 500MG  TABLET] 180 tablet 1    Sig: TAKE (1) TABLET BY MOUTH TWICE DAILY     Endocrinology:  Diabetes - Biguanides Failed - 11/12/2023  8:56 AM      Failed - Cr in normal range and within 360 days    Creatinine  Date Value Ref Range Status  08/06/2013 0.87 0.60 - 1.30 mg/dL Final   Creatinine, Ser  Date Value Ref Range Status  02/12/2022 1.43 (H) 0.57 - 1.00 mg/dL Final         Failed - HBA1C is between 0 and 7.9 and within 180 days    Hemoglobin A1C  Date Value Ref Range Status  09/30/2018 5.6  Final   Hgb A1c MFr Bld  Date Value Ref Range Status  04/11/2023 5.5 4.8 - 5.6 % Final    Comment:             Prediabetes: 5.7 - 6.4          Diabetes: >6.4          Glycemic control for adults with diabetes: <7.0          Failed - eGFR in normal range and within 360 days    EGFR (African American)  Date Value Ref Range Status  08/06/2013 >60  Final   GFR calc Af Amer  Date Value Ref Range Status  10/22/2019 77 >59 mL/min/1.73 Final   EGFR (Non-African Amer.)  Date Value Ref Range Status  08/06/2013 >60  Final    Comment:    eGFR values <74mL/min/1.73 m2 may be an indication of chronic kidney disease (CKD). Calculated eGFR is useful in patients with stable renal function. The eGFR calculation will not be reliable in acutely ill patients when serum creatinine is changing rapidly. It is not useful in  patients on dialysis. The eGFR calculation may not be applicable to patients at the low and high extremes of body sizes, pregnant women, and vegetarians.    GFR, Estimated  Date Value Ref Range Status  07/27/2020 >60 >60 mL/min Final    Comment:    (NOTE) Calculated using the CKD-EPI Creatinine Equation (2021)    eGFR  Date Value Ref Range Status  02/12/2022 38 (L) >59 mL/min/1.73 Final         Failed - CBC within normal  limits and completed in the last 12 months    WBC  Date Value Ref Range Status  06/19/2023 5.2 4.0 - 10.5 K/uL Final   RBC  Date Value Ref Range Status  06/19/2023 3.93 3.87 - 5.11 MIL/uL Final   Hemoglobin  Date Value Ref Range Status  06/19/2023 11.7 (L) 12.0 - 15.0 g/dL Final  78/29/5621 30.8 11.1 - 15.9 g/dL Final   HCT  Date Value Ref Range Status  06/19/2023 36.5 36.0 - 46.0 % Final   Hematocrit  Date Value Ref Range Status  04/23/2019 39.4 34.0 - 46.6 % Final   MCHC  Date Value Ref Range Status  06/19/2023 32.1 30.0 - 36.0 g/dL Final   [facility]  Date Value Ref Range Status  06/19/2023 29.8 26.0 - 34.0 pg Final   MCV  Date Value Ref Range Status  06/19/2023 92.9 80.0 - 100.0 fL Final  04/23/2019 85 79 - 97 fL Final  08/06/2013 88 80 - 100 fL Final   No results found for: "PLTCOUNTKUC", "LABPLAT", "POCPLA" RDW  Date Value Ref  Range Status  06/19/2023 12.9 11.5 - 15.5 % Final  04/23/2019 12.7 11.7 - 15.4 % Final  08/06/2013 13.0 11.5 - 14.5 % Final         Passed - B12 Level in normal range and within 720 days    Vitamin B-12  Date Value Ref Range Status  06/19/2023 841 180 - 914 pg/mL Final    Comment:    (NOTE) This assay is not validated for testing neonatal or myeloproliferative syndrome specimens for Vitamin B12 levels. Performed at [facility], [address], [place], [place] [address]          Passed - Valid encounter within last 6 months    Recent Outpatient Visits           2 months ago Essential hypertension   [facility] Jones, Deanna C, MD

## 2023-12-17 ENCOUNTER — Inpatient Hospital Stay: Payer: Medicare Other | Attending: Oncology

## 2023-12-17 ENCOUNTER — Other Ambulatory Visit: Payer: Medicare Other

## 2023-12-17 ENCOUNTER — Ambulatory Visit: Payer: Self-pay | Admitting: Oncology

## 2023-12-17 DIAGNOSIS — D631 Anemia in chronic kidney disease: Secondary | ICD-10-CM

## 2023-12-17 DIAGNOSIS — D509 Iron deficiency anemia, unspecified: Secondary | ICD-10-CM | POA: Insufficient documentation

## 2023-12-17 LAB — COMPREHENSIVE METABOLIC PANEL WITH GFR
ALT: 12 U/L (ref 0–44)
AST: 16 U/L (ref 15–41)
Albumin: 4 g/dL (ref 3.5–5.0)
Alkaline Phosphatase: 66 U/L (ref 38–126)
Anion gap: 9 (ref 5–15)
BUN: 24 mg/dL — ABNORMAL HIGH (ref 8–23)
CO2: 24 mmol/L (ref 22–32)
Calcium: 9.1 mg/dL (ref 8.9–10.3)
Chloride: 106 mmol/L (ref 98–111)
Creatinine, Ser: 1.44 mg/dL — ABNORMAL HIGH (ref 0.44–1.00)
GFR, Estimated: 38 mL/min — ABNORMAL LOW (ref >60)
Glucose, Bld: 92 mg/dL (ref 70–99)
Potassium: 5.2 mmol/L — ABNORMAL HIGH (ref 3.5–5.1)
Sodium: 139 mmol/L (ref 135–145)
Total Bilirubin: 0.8 mg/dL (ref 0.0–1.2)
Total Protein: 7.3 g/dL (ref 6.5–8.1)

## 2023-12-17 LAB — IRON AND TIBC
Iron: 72 ug/dL (ref 28–170)
Saturation Ratios: 21 % (ref 10.4–31.8)
TIBC: 346 ug/dL (ref 250–450)
UIBC: 274 ug/dL

## 2023-12-17 LAB — CBC
HCT: 36.6 % (ref 36.0–46.0)
Hemoglobin: 12 g/dL (ref 12.0–15.0)
MCH: 29.3 pg (ref 26.0–34.0)
MCHC: 32.8 g/dL (ref 30.0–36.0)
MCV: 89.3 fL (ref 80.0–100.0)
Platelets: 132 10*3/uL — ABNORMAL LOW (ref 150–400)
RBC: 4.1 MIL/uL (ref 3.87–5.11)
RDW: 12.7 % (ref 11.5–15.5)
WBC: 6.2 10*3/uL (ref 4.0–10.5)
nRBC: 0 % (ref 0.0–0.2)

## 2023-12-17 LAB — FERRITIN: Ferritin: 27 ng/mL (ref 11–307)

## 2023-12-18 NOTE — Telephone Encounter (Signed)
-----   Message from Avonne Boettcher sent at 12/17/2023  3:35 PM EDT ----- She is not anemic but she is iron deficient. Does she want iv iron?

## 2023-12-18 NOTE — Telephone Encounter (Addendum)
 Per Dr. Randy Buttery "She is not anemic but she is iron deficient. Does she want iv iron?".  Outbound call; voice message left, will try again shortly.

## 2023-12-19 ENCOUNTER — Telehealth: Payer: Self-pay | Admitting: *Deleted

## 2023-12-19 NOTE — Telephone Encounter (Signed)
 Dr. Randy Buttery sent a message and it says that she is not anemic, iron is low.  Durell says that she could have some IV iron if she wanted.  The patient says that she is feeling okay she says if you gets worse she will give us  a call but for right now she really does not want to have an IV iron she doing okay she says.  Told her if she changed her mind and wants the IV iron she should Jeffs give us  a call in the next 2 to 3 weeks.  She is okay with that

## 2023-12-20 ENCOUNTER — Encounter: Payer: Self-pay | Admitting: Oncology

## 2023-12-20 NOTE — Telephone Encounter (Signed)
 Per Frances Harmon "Dr. Randy Buttery sent a message and it says that she is not anemic, iron is low.  Frances Harmon says that she could have some IV iron if she wanted.  The patient says that she is feeling okay she says if you gets worse she will give us  a call but for right now she really does not want to have an IV iron she doing okay she says.  Told her if she changed her mind and wants the IV iron she should Jeffs give us  a call in the next 2 to 3 weeks.  She is okay with that".  No further follow up needed at this time.

## 2023-12-23 ENCOUNTER — Ambulatory Visit: Payer: Medicare Other | Admitting: Podiatry

## 2023-12-23 ENCOUNTER — Encounter: Payer: Self-pay | Admitting: Podiatry

## 2023-12-23 VITALS — Ht 62.0 in | Wt 174.0 lb

## 2023-12-23 DIAGNOSIS — M79675 Pain in left toe(s): Secondary | ICD-10-CM | POA: Diagnosis not present

## 2023-12-23 DIAGNOSIS — L84 Corns and callosities: Secondary | ICD-10-CM

## 2023-12-23 DIAGNOSIS — M79674 Pain in right toe(s): Secondary | ICD-10-CM

## 2023-12-23 DIAGNOSIS — E0843 Diabetes mellitus due to underlying condition with diabetic autonomic (poly)neuropathy: Secondary | ICD-10-CM

## 2023-12-23 DIAGNOSIS — D696 Thrombocytopenia, unspecified: Secondary | ICD-10-CM

## 2023-12-23 DIAGNOSIS — B351 Tinea unguium: Secondary | ICD-10-CM

## 2023-12-29 NOTE — Progress Notes (Signed)
  Subjective:  Patient ID: Frances Harmon, female    DOB: 1945/12/30,  MRN: [id number]  Frances Harmon presents to clinic today for at risk foot care with history of diabetic neuropathy and callus(es) right lower extremity and painful mycotic toenails that are difficult to trim. Painful toenails interfere with ambulation. Aggravating factors include wearing enclosed shoe gear. Pain is relieved with periodic professional debridement. Painful calluses are aggravated when weightbearing with and without shoegear. Pain is relieved with periodic professional debridement.  Chief Complaint  Patient presents with   Nail Problem    Pt is here for [facility] last A1C was 5.5 PCP is Dr Rochelle Chu and LOV was in February.   New problem(s): None.   No Known Allergies  Review of Systems: Negative except as noted in the HPI.  Objective: No changes noted in today's physical examination. There were no vitals filed for this visit. Frances Harmon is a pleasant 78 y.o. female in NAD. AAO x 3.  Vascular Examination: Capillary refill time immediate b/l. Palpable pedal pulses b/l. Pedal hair present b/l. No pain with calf compression b/l. Skin temperature gradient WNL b/l. No cyanosis or clubbing b/l. No ischemia or gangrene noted b/l. No edema noted b/l LE.  Neurological Examination: Sensation grossly intact b/l with 10 gram monofilament. Pt has subjective symptoms of neuropathy.  Dermatological Examination: Pedal skin with normal turgor, texture and tone b/l.  No open wounds. No interdigital macerations.   Toenails 1-5 b/l thick, discolored, elongated with subungual debris and pain on dorsal palpation.   Hyperkeratotic lesion(s) right great toe and 1st metatarsal head right lower extremity.  No erythema, no edema, no drainage, no fluctuance.  Musculoskeletal Examination: Muscle strength 5/5 to all lower extremity muscle groups bilaterally. Clawtoe deformity right second digit.  Radiographs: None  Last  A1c:      Latest Ref Rng & Units 04/11/2023    3:02 PM  Hemoglobin A1C  Hemoglobin-A1c 4.8 - 5.6 % 5.5    Assessment/Plan: 1. Pain due to onychomycosis of toenails of both feet   2. Callus   3. Thrombocytopenia (HCC)   4. Diabetes mellitus due to underlying condition with diabetic autonomic neuropathy, unspecified whether long term insulin  use [facility])    Consent given for treatment. Patient examined. All patient's and/or POA's questions/concerns addressed on today's visit. Mycotic toenails 1-5 debrided in length and girth without incident. Callus(es) right great toe and 1st metatarsal head right lower extremity pared with sharp debridement without incident. Continue foot and shoe inspections daily. Monitor blood glucose per PCP/Endocrinologist's recommendations.Continue soft, supportive shoe gear daily. Report any pedal injuries to medical professional. Call office if there are any quesitons/concerns. -Patient/POA to call should there be question/concern in the interim.   Return in about 3 months (around 03/24/2024).  Frances Harmon, DPM      [facility] LOCATION: [address], [place] [address]                   Office [phone]   [facility] LOCATION: [address] [place], [place] [address] Office [phone]

## 2024-01-02 ENCOUNTER — Telehealth: Payer: Self-pay | Admitting: *Deleted

## 2024-01-02 NOTE — Telephone Encounter (Signed)
 Patient called today and said someone had talked to her and she did not know who was.  I thought it might be from [place] .  I spoke to Burdette the last time she had talked to her was on May 29 and she did not need any IV iron.  When I called the patient back I asked her what is going on and she says that now she feels like she needs the iron she is getting worse she tired.  And now she wants to get IV iron.  I told her I will send the message to Dr. Randy Buttery as well as the Randy Buttery team and they will get back with you.

## 2024-01-02 NOTE — Telephone Encounter (Signed)
 Frances Harmon- please schedule for iv iron

## 2024-01-07 DIAGNOSIS — E559 Vitamin D deficiency, unspecified: Secondary | ICD-10-CM | POA: Diagnosis not present

## 2024-01-07 DIAGNOSIS — E118 Type 2 diabetes mellitus with unspecified complications: Secondary | ICD-10-CM | POA: Diagnosis not present

## 2024-01-07 DIAGNOSIS — N1832 Chronic kidney disease, stage 3b: Secondary | ICD-10-CM | POA: Diagnosis not present

## 2024-01-07 DIAGNOSIS — E782 Mixed hyperlipidemia: Secondary | ICD-10-CM | POA: Diagnosis not present

## 2024-01-07 DIAGNOSIS — I10 Essential (primary) hypertension: Secondary | ICD-10-CM | POA: Diagnosis not present

## 2024-01-07 DIAGNOSIS — E538 Deficiency of other specified B group vitamins: Secondary | ICD-10-CM | POA: Diagnosis not present

## 2024-01-14 DIAGNOSIS — E1122 Type 2 diabetes mellitus with diabetic chronic kidney disease: Secondary | ICD-10-CM | POA: Diagnosis not present

## 2024-01-14 DIAGNOSIS — I129 Hypertensive chronic kidney disease with stage 1 through stage 4 chronic kidney disease, or unspecified chronic kidney disease: Secondary | ICD-10-CM | POA: Diagnosis not present

## 2024-01-14 DIAGNOSIS — E559 Vitamin D deficiency, unspecified: Secondary | ICD-10-CM | POA: Diagnosis not present

## 2024-01-14 DIAGNOSIS — N189 Chronic kidney disease, unspecified: Secondary | ICD-10-CM | POA: Diagnosis not present

## 2024-01-14 DIAGNOSIS — Z0000 Encounter for general adult medical examination without abnormal findings: Secondary | ICD-10-CM | POA: Diagnosis not present

## 2024-01-14 DIAGNOSIS — E785 Hyperlipidemia, unspecified: Secondary | ICD-10-CM | POA: Diagnosis not present

## 2024-01-16 ENCOUNTER — Inpatient Hospital Stay: Attending: Oncology

## 2024-01-16 ENCOUNTER — Other Ambulatory Visit: Payer: Self-pay

## 2024-01-16 ENCOUNTER — Other Ambulatory Visit: Payer: Self-pay | Admitting: Oncology

## 2024-01-16 VITALS — BP 152/65 | HR 61 | Temp 96.8°F | Resp 18

## 2024-01-16 DIAGNOSIS — D509 Iron deficiency anemia, unspecified: Secondary | ICD-10-CM | POA: Insufficient documentation

## 2024-01-16 DIAGNOSIS — Z79899 Other long term (current) drug therapy: Secondary | ICD-10-CM | POA: Diagnosis not present

## 2024-01-16 MED ORDER — IRON SUCROSE 20 MG/ML IV SOLN
200.0000 mg | Freq: Once | INTRAVENOUS | Status: AC
  Administered 2024-01-16: 200 mg via INTRAVENOUS

## 2024-01-23 ENCOUNTER — Inpatient Hospital Stay: Attending: Oncology

## 2024-01-23 VITALS — BP 134/70 | HR 60 | Temp 98.3°F | Resp 17

## 2024-01-23 DIAGNOSIS — D509 Iron deficiency anemia, unspecified: Secondary | ICD-10-CM | POA: Insufficient documentation

## 2024-01-23 MED ORDER — IRON SUCROSE 20 MG/ML IV SOLN
200.0000 mg | INTRAVENOUS | Status: DC
  Administered 2024-01-23: 200 mg via INTRAVENOUS
  Filled 2024-01-23: qty 10

## 2024-01-23 MED ORDER — SODIUM CHLORIDE 0.9% FLUSH
10.0000 mL | Freq: Once | INTRAVENOUS | Status: AC | PRN
  Administered 2024-01-23: 10 mL
  Filled 2024-01-23: qty 10

## 2024-01-23 NOTE — Patient Instructions (Signed)

## 2024-01-23 NOTE — Progress Notes (Signed)
Patient tolerated Venofer infusion well. Explained recommendation of 30 min post monitoring. Patient refused to wait post monitoring. Educated on what signs to watch for & to call with any concerns. No questions, discharged. Stable  

## 2024-01-29 ENCOUNTER — Other Ambulatory Visit: Payer: Self-pay | Admitting: Family Medicine

## 2024-01-29 DIAGNOSIS — Z1231 Encounter for screening mammogram for malignant neoplasm of breast: Secondary | ICD-10-CM

## 2024-01-30 ENCOUNTER — Inpatient Hospital Stay

## 2024-01-30 VITALS — BP 137/63 | HR 55 | Temp 97.6°F | Resp 16

## 2024-01-30 DIAGNOSIS — D509 Iron deficiency anemia, unspecified: Secondary | ICD-10-CM | POA: Diagnosis not present

## 2024-01-30 MED ORDER — IRON SUCROSE 20 MG/ML IV SOLN
200.0000 mg | INTRAVENOUS | Status: DC
  Administered 2024-01-30: 200 mg via INTRAVENOUS

## 2024-01-30 NOTE — Patient Instructions (Signed)

## 2024-02-06 ENCOUNTER — Inpatient Hospital Stay

## 2024-02-06 VITALS — BP 133/59 | HR 52 | Temp 96.1°F | Resp 16

## 2024-02-06 DIAGNOSIS — D509 Iron deficiency anemia, unspecified: Secondary | ICD-10-CM | POA: Diagnosis not present

## 2024-02-06 MED ORDER — IRON SUCROSE 20 MG/ML IV SOLN
200.0000 mg | Freq: Once | INTRAVENOUS | Status: AC
Start: 2024-02-06 — End: 2024-02-06
  Administered 2024-02-06: 200 mg via INTRAVENOUS
  Filled 2024-02-06: qty 10

## 2024-02-06 MED ORDER — SODIUM CHLORIDE 0.9% FLUSH
10.0000 mL | Freq: Once | INTRAVENOUS | Status: AC | PRN
Start: 2024-02-06 — End: 2024-02-06
  Administered 2024-02-06: 10 mL
  Filled 2024-02-06: qty 10

## 2024-02-11 NOTE — Progress Notes (Signed)
 [facility] Quality Team Note  Name: Frances Harmon Date of Birth: 1945-09-11 MRN: [id number] Date: 02/11/2024  [facility] Quality Team has reviewed this patient's chart, please see recommendations below:  [facility] Quality Other; (Chart reviewed for KED measure. Patient needs urine microalbumin/creatinine ratio completed for gap closure.)

## 2024-02-13 ENCOUNTER — Inpatient Hospital Stay

## 2024-02-13 VITALS — BP 143/65 | HR 53 | Temp 97.0°F | Resp 18

## 2024-02-13 DIAGNOSIS — D509 Iron deficiency anemia, unspecified: Secondary | ICD-10-CM | POA: Diagnosis not present

## 2024-02-13 MED ORDER — IRON SUCROSE 20 MG/ML IV SOLN
200.0000 mg | Freq: Once | INTRAVENOUS | Status: AC
  Administered 2024-02-13: 200 mg via INTRAVENOUS
  Filled 2024-02-13: qty 10

## 2024-02-13 NOTE — Patient Instructions (Signed)

## 2024-03-04 ENCOUNTER — Ambulatory Visit
Admission: RE | Admit: 2024-03-04 | Discharge: 2024-03-04 | Disposition: A | Discharge status: Home / Self Care | Source: Ambulatory Visit | Attending: Family Medicine | Admitting: Family Medicine

## 2024-03-04 DIAGNOSIS — Z1231 Encounter for screening mammogram for malignant neoplasm of breast: Secondary | ICD-10-CM | POA: Diagnosis present

## 2024-03-30 ENCOUNTER — Ambulatory Visit: Admitting: Podiatry

## 2024-03-30 ENCOUNTER — Encounter: Payer: Self-pay | Admitting: Podiatry

## 2024-03-30 DIAGNOSIS — M79674 Pain in right toe(s): Secondary | ICD-10-CM | POA: Diagnosis not present

## 2024-03-30 DIAGNOSIS — B351 Tinea unguium: Secondary | ICD-10-CM

## 2024-03-30 DIAGNOSIS — M79675 Pain in left toe(s): Secondary | ICD-10-CM | POA: Diagnosis not present

## 2024-03-30 DIAGNOSIS — E0843 Diabetes mellitus due to underlying condition with diabetic autonomic (poly)neuropathy: Secondary | ICD-10-CM

## 2024-04-02 NOTE — Progress Notes (Signed)
  Subjective:  Patient ID: Frances Harmon, female    DOB: 1946-04-24,  MRN: [id number]  Frances Harmon presents to clinic today for at risk foot care with history of diabetic neuropathy and painful thick toenails that are difficult to trim. Pain interferes with ambulation. Aggravating factors include wearing enclosed shoe gear. Pain is relieved with periodic professional debridement.  Chief Complaint  Patient presents with   Nail Problem    ROUTINE FOOT CARE   New problem(s): None.   PCP is Valora Agent, MD. Frances Harmon 12/23/2023.  No Known Allergies  Review of Systems: Negative except as noted in the HPI.  Objective:  There were no vitals filed for this visit. Frances Harmon is a pleasant 78 y.o. female in NAD. AAO x 3.  Vascular Examination: Capillary refill time immediate b/l. Palpable pedal pulses b/l. Pedal hair present b/l. No pain with calf compression b/l. Skin temperature gradient WNL b/l. No cyanosis or clubbing b/l. No ischemia or gangrene noted b/l. No edema noted b/l LE.  Neurological Examination: Sensation grossly intact b/l with 10 gram monofilament. Pt has subjective symptoms of neuropathy.  Dermatological Examination: Pedal skin with normal turgor, texture and tone b/l.  No open wounds. No interdigital macerations.   Toenails 1-5 b/l thick, discolored, elongated with subungual debris and pain on dorsal palpation.   Resolving hyperkeratotic lesion(s) right great toe and 1st metatarsal head right lower extremity.  No erythema, no edema, no drainage, no fluctuance.  Musculoskeletal Examination: Muscle strength 5/5 to all lower extremity muscle groups bilaterally. Clawtoe deformity right second digit.  Radiographs: None  Assessment/Plan: 1. Pain due to onychomycosis of toenails of both feet   2. Diabetes mellitus due to underlying condition with diabetic autonomic neuropathy, unspecified whether long term insulin  use [facility])   Patient was evaluated and  treated. All patient's and/or POA's questions/concerns addressed on today's visit. Toenails 1-5 debrided in length and girth without incident. Continue foot and shoe inspections daily. Monitor blood glucose per PCP/Endocrinologist's recommendations. Continue soft, supportive shoe gear daily. Report any pedal injuries to medical professional. Call office if there are any questions/concerns. -Patient/POA to call should there be question/concern in the interim.   Return in about 3 months (around 06/29/2024).  Frances Harmon, DPM      [place] LOCATION: [address], [place] [address]                   Office [phone]   [facility] LOCATION: [address] [place], [place] [address] Office [phone]

## 2024-04-08 ENCOUNTER — Ambulatory Visit: Payer: Medicare Other

## 2024-05-22 ENCOUNTER — Encounter: Payer: Self-pay | Admitting: Oncology

## 2024-06-22 ENCOUNTER — Other Ambulatory Visit: Payer: Self-pay

## 2024-06-22 ENCOUNTER — Inpatient Hospital Stay: Payer: Medicare Other | Admitting: Nurse Practitioner

## 2024-06-22 ENCOUNTER — Inpatient Hospital Stay: Payer: Medicare Other | Attending: Oncology

## 2024-06-22 ENCOUNTER — Other Ambulatory Visit: Payer: Self-pay | Admitting: *Deleted

## 2024-06-22 ENCOUNTER — Encounter: Payer: Self-pay | Admitting: Nurse Practitioner

## 2024-06-22 VITALS — BP 149/62 | HR 58 | Temp 98.0°F | Resp 16 | Ht 62.0 in | Wt 175.0 lb

## 2024-06-22 DIAGNOSIS — Z79899 Other long term (current) drug therapy: Secondary | ICD-10-CM | POA: Diagnosis not present

## 2024-06-22 DIAGNOSIS — N183 Chronic kidney disease, stage 3 unspecified: Secondary | ICD-10-CM | POA: Insufficient documentation

## 2024-06-22 DIAGNOSIS — I129 Hypertensive chronic kidney disease with stage 1 through stage 4 chronic kidney disease, or unspecified chronic kidney disease: Secondary | ICD-10-CM | POA: Insufficient documentation

## 2024-06-22 DIAGNOSIS — E1122 Type 2 diabetes mellitus with diabetic chronic kidney disease: Secondary | ICD-10-CM | POA: Diagnosis present

## 2024-06-22 DIAGNOSIS — D509 Iron deficiency anemia, unspecified: Secondary | ICD-10-CM

## 2024-06-22 DIAGNOSIS — Z7984 Long term (current) use of oral hypoglycemic drugs: Secondary | ICD-10-CM | POA: Insufficient documentation

## 2024-06-22 DIAGNOSIS — Z803 Family history of malignant neoplasm of breast: Secondary | ICD-10-CM | POA: Diagnosis not present

## 2024-06-22 DIAGNOSIS — D631 Anemia in chronic kidney disease: Secondary | ICD-10-CM

## 2024-06-22 LAB — CBC WITH DIFFERENTIAL (CANCER CENTER ONLY)
Abs Immature Granulocytes: 0.02 K/uL (ref 0.00–0.07)
Basophils Absolute: 0.1 K/uL (ref 0.0–0.1)
Basophils Relative: 1 %
Eosinophils Absolute: 0.1 K/uL (ref 0.0–0.5)
Eosinophils Relative: 1 %
HCT: 37.4 % (ref 36.0–46.0)
Hemoglobin: 12.3 g/dL (ref 12.0–15.0)
Immature Granulocytes: 0 %
Lymphocytes Relative: 25 %
Lymphs Abs: 1.6 K/uL (ref 0.7–4.0)
MCH: 29.7 pg (ref 26.0–34.0)
MCHC: 32.9 g/dL (ref 30.0–36.0)
MCV: 90.3 fL (ref 80.0–100.0)
Monocytes Absolute: 0.5 K/uL (ref 0.1–1.0)
Monocytes Relative: 8 %
Neutro Abs: 4.1 K/uL (ref 1.7–7.7)
Neutrophils Relative %: 65 %
Platelet Count: 141 K/uL — ABNORMAL LOW (ref 150–400)
RBC: 4.14 MIL/uL (ref 3.87–5.11)
RDW: 13.2 % (ref 11.5–15.5)
WBC Count: 6.4 K/uL (ref 4.0–10.5)
nRBC: 0 % (ref 0.0–0.2)

## 2024-06-22 LAB — CMP (CANCER CENTER ONLY)
ALT: 12 U/L (ref 0–44)
AST: 19 U/L (ref 15–41)
Albumin: 4 g/dL (ref 3.5–5.0)
Alkaline Phosphatase: 83 U/L (ref 38–126)
Anion gap: 8 (ref 5–15)
BUN: 17 mg/dL (ref 8–23)
CO2: 25 mmol/L (ref 22–32)
Calcium: 9.3 mg/dL (ref 8.9–10.3)
Chloride: 104 mmol/L (ref 98–111)
Creatinine: 1.25 mg/dL — ABNORMAL HIGH (ref 0.44–1.00)
GFR, Estimated: 45 mL/min — ABNORMAL LOW (ref >60)
Glucose, Bld: 95 mg/dL (ref 70–99)
Potassium: 4.7 mmol/L (ref 3.5–5.1)
Sodium: 137 mmol/L (ref 135–145)
Total Bilirubin: 1.1 mg/dL (ref 0.0–1.2)
Total Protein: 7.3 g/dL (ref 6.5–8.1)

## 2024-06-22 LAB — FERRITIN: Ferritin: 327 ng/mL — ABNORMAL HIGH (ref 11–307)

## 2024-06-22 LAB — IRON AND TIBC
Iron: 99 ug/dL (ref 28–170)
Saturation Ratios: 33 % — ABNORMAL HIGH (ref 10.4–31.8)
TIBC: 298 ug/dL (ref 250–450)
UIBC: 199 ug/dL

## 2024-06-22 NOTE — Progress Notes (Signed)
 Hematology/Oncology Consult Note [facility]  Telephone:([phone] [phone]  Patient Care Team: Valora Lynwood FALCON, MD as PCP - General (Family Medicine) Melanee Annah BROCKS, MD as Consulting Physician (Oncology)   Name of the patient: Frances Harmon  [id number]  [birth year]   Date of visit: 06/22/24  Diagnosis-anemia of chronic kidney disease  Chief complaint/ Reason for visit-routine follow-up of anemia  Heme/Onc history: patient is a 78 year old female with a past medical history significant for,Hypertension hyperlipidemia type 2 diabetes stage III CKD who has been referred for anemia.  Most recent CBC 01/03/2022 showed white cell count of 4.5, H&H of 9.6/28.7 and a platelet count of 104.  Patient's hemoglobin back in October 2022 was normal at 12.2 and then gradually drifting down since then.  Platelets were 183 back in October 2022 and presently 104.  Most recent serum creatinine was 1.2.  Calcium  and total protein was normal.  Iron  studies showed a low ferritin of 8 on 01/02/2022.  Folate levels were normal at 16.7.B12 levels were low at 134.  B6 was normal.   Patient underwent surgery for diverticular stricture and large bowel obstruction in April 2022 at [facility] requiring exploratory laparotomy sigmoidectomy and ileostomy.  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  She continues to be followed by Dr. Lazarus for her CKD.  She tolerated IV iron  well without significant side effects.  Not on oral iron .  She continues to work and denies complaints today.  ECOG PS- 1 Pain scale- 0  Review of systems- Review of Systems  Constitutional:  Positive for malaise/fatigue. Negative for chills, fever and weight loss.  HENT:  Negative for congestion, ear discharge and  nosebleeds.   Eyes:  Negative for blurred vision.  Respiratory:  Negative for cough, hemoptysis, sputum production, shortness of breath and wheezing.   Cardiovascular:  Negative for chest pain, palpitations, orthopnea and claudication.  Gastrointestinal:  Negative for abdominal pain, blood in stool, constipation, diarrhea, heartburn, melena, nausea and vomiting.  Genitourinary:  Negative for dysuria, flank pain, frequency, hematuria and urgency.  Musculoskeletal:  Negative for back pain, joint pain and myalgias.  Skin:  Negative for rash.  Neurological:  Negative for dizziness, tingling, focal weakness, seizures, weakness and headaches.  Endo/Heme/Allergies:  Does not bruise/bleed easily.  Psychiatric/Behavioral:  Negative for depression and suicidal ideas. The patient does not have insomnia.     No Known Allergies  Past Medical History:  Diagnosis Date   Diabetes mellitus without complication (HCC)    GERD (gastroesophageal reflux disease)    Hyperlipidemia    Hypertension    Past Surgical History:  Procedure Laterality Date   COLONOSCOPY  2009   repeat in 5 years- Dr Waylan   COLONOSCOPY WITH PROPOFOL  N/A 09/09/2015   Procedure: COLONOSCOPY WITH PROPOFOL ;  Surgeon: Deward CINDERELLA Piedmont, MD;  Location: [facility] ENDOSCOPY;  Service: Gastroenterology;  Laterality: N/A;   FOOT SURGERY Bilateral    Bunion removal   INCONTINENCE SURGERY     VAGINAL HYSTERECTOMY     VENTRAL HERNIA REPAIR N/A 05/06/2015   Procedure: HERNIA REPAIR VENTRAL ADULT;  Surgeon: Larinda Unknown Sharps, MD;  Location: [facility] ORS;  Service: General;  Laterality: N/A;    Social History   Socioeconomic History   Marital status: Married    Spouse name: Not on file   Number of  children: 2   Years of education: Not on file   Highest education level: 12th grade  Occupational History   Occupation: Retired    Comment: works part time  Tobacco Use   Smoking status: Never   Smokeless tobacco: Never   Tobacco comments:    smokin  cessation materials not required  Vaping Use   Vaping status: Never Used  Substance and Sexual Activity   Alcohol use: No    Alcohol/week: 0.0 standard drinks of alcohol   Drug use: No   Sexual activity: Not Currently  Other Topics Concern   Not on file  Social History Narrative   Not on file   Social Drivers of Health   Financial Resource Strain: Patient Declined (01/14/2024)   Received from [facility]   Overall Financial Resource Strain (CARDIA)    Difficulty of Paying Living Expenses: Patient declined  Food Insecurity: Patient Declined (01/14/2024)   Received from [facility]   Hunger Vital Sign    Within the past 12 months, you worried that your food would run out before you got the money to buy more.: Patient declined    Within the past 12 months, the food you bought just didn't last and you didn't have money to get more.: Patient declined  Transportation Needs: Patient Declined (01/14/2024)   Received from [facility] - Transportation    In the past 12 months, has lack of transportation kept you from medical appointments or from getting medications?: Patient declined    Lack of Transportation (Non-Medical): Patient declined  Physical Activity: Sufficiently Active (04/03/2023)   Exercise Vital Sign    Days of Exercise per Week: 5 days    Minutes of Exercise per Session: 30 min  Stress: No Stress Concern Present (04/03/2023)   [employer] of Occupational Health - Occupational Stress Questionnaire    Feeling of Stress : Not at all  Social Connections: Moderately Integrated (04/03/2023)   Social Connection and Isolation Panel    Frequency of Communication with Friends and Family: Twice a week    Frequency of Social Gatherings with Friends and Family: Twice a week    Attends Religious Services: More than 4 times per year    Active Member of [employer] or Organizations: No    Attends Banker Meetings:  Never    Marital Status: Married  Catering Manager Violence: Not At Risk (08/15/2023)   Humiliation, Afraid, Rape, and Kick questionnaire    Fear of Current or Ex-Partner: No    Emotionally Abused: No    Physically Abused: No    Sexually Abused: No    Family History  Problem Relation Age of Onset   Breast cancer Daughter 63   Breast cancer Mother 66   Hypertension Brother    Diabetes Brother    Arthritis Brother      Current Outpatient Medications:    Acetaminophen  (TYLENOL ) 325 MG CAPS, PRN, Disp: , Rfl:    amLODipine  (NORVASC ) 10 MG tablet, TAKE (1) TABLET BY MOUTH EVERY DAY, Disp: 90 tablet, Rfl: 1   atorvastatin  (LIPITOR) 20 MG tablet, TAKE (1) TABLET BY MOUTH EVERY DAY, Disp: 90 tablet, Rfl: 1   carvedilol  (COREG ) 6.25 MG tablet, Take 1 tablet (6.25 mg total) by mouth 2 (two) times daily with a meal., Disp: 180 tablet, Rfl: 1   Cyanocobalamin  (VITAMIN B-12 PO), Take by mouth., Disp: , Rfl:    glipiZIDE  (GLUCOTROL  XL) 2.5 MG 24 hr tablet,  Take 1 tablet by mouth every day, Disp: 90 tablet, Rfl: 1   losartan  (COZAAR ) 25 MG tablet, Take 25 mg by mouth daily., Disp: , Rfl:    MAGNESIUM PO, Take by mouth., Disp: , Rfl:    metFORMIN  (GLUCOPHAGE ) 500 MG tablet, TAKE (1) TABLET BY MOUTH TWICE DAILY, Disp: 180 tablet, Rfl: 1   omeprazole  (PRILOSEC) 20 MG capsule, TAKE (1) CAPSULE BY MOUTH EVERY MORNING, Disp: 90 capsule, Rfl: 1  Physical exam:  Vitals:   06/22/24 1500 06/22/24 1508  BP: (!) 160/73 (!) 149/62  Pulse: (!) 58   Resp: 16   Temp: 98 F (36.7 C)   TempSrc: Tympanic   SpO2: 100%   Weight: 175 lb (79.4 kg)   Height: 5' 2 (1.575 m)    Physical Exam Vitals reviewed.  Constitutional:      Appearance: She is not ill-appearing.  Cardiovascular:     Rate and Rhythm: Normal rate and regular rhythm.  Pulmonary:     Effort: Pulmonary effort is normal.     Breath sounds: Normal breath sounds.  Abdominal:     General: There is no distension.  Skin:    General: Skin  is warm and dry.  Neurological:     Mental Status: She is alert and oriented to person, place, and time.  Psychiatric:        Mood and Affect: Mood normal.        Behavior: Behavior normal.        Latest Ref Rng & Units 12/17/2023    2:19 PM  CMP  Glucose 70 - 99 mg/dL 92   BUN 8 - 23 mg/dL 24   Creatinine 9.55 - 1.00 mg/dL 8.55   Sodium 864 - 854 mmol/L 139   Potassium 3.5 - 5.1 mmol/L 5.2   Chloride 98 - 111 mmol/L 106   CO2 22 - 32 mmol/L 24   Calcium  8.9 - 10.3 mg/dL 9.1   Total Protein 6.5 - 8.1 g/dL 7.3   Total Bilirubin 0.0 - 1.2 mg/dL 0.8   Alkaline Phos 38 - 126 U/L 66   AST 15 - 41 U/L 16   ALT 0 - 44 U/L 12       Latest Ref Rng & Units 06/22/2024    2:50 PM  CBC  WBC 4.0 - 10.5 K/uL 6.4   Hemoglobin 12.0 - 15.0 g/dL 87.6   Hematocrit 63.9 - 46.0 % 37.4   Platelets 150 - 400 K/uL 141     Assessment and plan- Patient is a 78 y.o. female who returns to clinic for follow-up of:  1.  Anemia of chronic kidney disease-hemoglobin has been hematocrit have been stable between 11-12 over the last 2 years.  She has not required any EPO so far.  Previously, ferritin was 38 with iron  saturation of 25%.  Goal ferritin greater than 50, therefore, she received Venofer  x 5, last 02/13/24.  Today, her hemoglobin is 12.3.  Improved.  Ferritin and iron  studies are pending at time of visit. Hold additional iv iron  today.  2.  Mildly elevated kappa lambda light chain ratio-previously noted.  Both kappa and lambda light chains were elevated which is likely secondary to CKD.  We will continue to monitor conservatively on a yearly basis.  Today's results are pending.  Disposition: 6 mo- lab only (cbc, ferritin, iron  studies) 12 mo- lab (cbc, cmp, ferritin, iron  studies, kappa lambda light chains), Dr Melanee- la   Visit Diagnosis 1. Anemia of chronic kidney  failure, stage 3 (moderate) (HCC)     Tinnie Dawn, DNP, AGNP-C, AOCNP [facility] [phone]  (clinic) 06/22/2024

## 2024-06-23 LAB — KAPPA/LAMBDA LIGHT CHAINS
Kappa free light chain: 52 mg/L — ABNORMAL HIGH (ref 3.3–19.4)
Kappa, lambda light chain ratio: 1.71 — ABNORMAL HIGH (ref 0.26–1.65)
Lambda free light chains: 30.4 mg/L — ABNORMAL HIGH (ref 5.7–26.3)

## 2024-06-29 ENCOUNTER — Ambulatory Visit: Admitting: Podiatry

## 2024-06-29 ENCOUNTER — Encounter: Payer: Self-pay | Admitting: Podiatry

## 2024-06-29 DIAGNOSIS — E0843 Diabetes mellitus due to underlying condition with diabetic autonomic (poly)neuropathy: Secondary | ICD-10-CM | POA: Diagnosis not present

## 2024-06-29 DIAGNOSIS — M79674 Pain in right toe(s): Secondary | ICD-10-CM | POA: Diagnosis not present

## 2024-06-29 DIAGNOSIS — M79675 Pain in left toe(s): Secondary | ICD-10-CM | POA: Diagnosis not present

## 2024-06-29 DIAGNOSIS — B351 Tinea unguium: Secondary | ICD-10-CM | POA: Diagnosis not present

## 2024-07-05 ENCOUNTER — Encounter: Payer: Self-pay | Admitting: Podiatry

## 2024-07-05 NOTE — Progress Notes (Signed)
°  Subjective:  Patient ID: Frances Harmon, female    DOB: 10-Jan-1946,  MRN: [id number]  Frances Harmon presents to clinic today for at risk foot care with history of diabetic neuropathy and painful mycotic toenails of both feet that are difficult to trim. Pain interferes with daily activities and wearing enclosed shoe gear comfortably.  Chief Complaint  Patient presents with   [facility]    Rm1 Diabetic foot care/ A1C 5.6 Dr. Lynwood Null Last visit June 2025   New problem(s): None.   PCP is Null Lynwood FALCON, MD.  Allergies[1]  Review of Systems: Negative except as noted in the HPI.  Objective: No changes noted in today's physical examination. There were no vitals filed for this visit. Frances Harmon is a pleasant 78 y.o. female in NAD. AAO x 3.  Vascular Examination: Capillary refill time immediate b/l. Palpable pedal pulses b/l. Pedal hair present b/l. No pain with calf compression b/l. Skin temperature gradient WNL b/l. No cyanosis or clubbing b/l. No ischemia or gangrene noted b/l. No edema noted b/l LE.  Neurological Examination: Sensation grossly intact b/l with 10 gram monofilament. Pt has subjective symptoms of neuropathy.  Dermatological Examination: Pedal skin with normal turgor, texture and tone b/l.  No open wounds. No interdigital macerations.   Toenails 1-5 b/l thick, discolored, elongated with subungual debris and pain on dorsal palpation.   Musculoskeletal Examination: Muscle strength 5/5 to all lower extremity muscle groups bilaterally. Clawtoe deformity right second digit.  Radiographs: None  Assessment/Plan: 1. Pain due to onychomycosis of toenails of both feet   2. 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 Patient examined. All patient's and/or POA's questions/concerns addressed on today's visit. Mycotic toenails 1-5 b/l debrided in  length and girth without incident. Continue foot and shoe inspections daily. Monitor blood glucose per PCP/Endocrinologist's recommendations.Continue soft, supportive shoe gear daily. Report any pedal injuries to medical professional. Call office if there are any quesitons/concerns. -Patient/POA to call should there be question/concern in the interim.   Return in about 3 months (around 09/27/2024).  Delon LITTIE Merlin, DPM      [place] LOCATION: [address] [place] [address]                   Office [phone]   [facility] LOCATION: [address] [place], [place] [address] Office [phone]     [1] No Known Allergies

## 2024-08-25 ENCOUNTER — Ambulatory Visit
Admission: EM | Admit: 2024-08-25 | Discharge: 2024-08-25 | Disposition: A | Discharge status: Home / Self Care | Source: Home / Self Care | Attending: Family Medicine | Admitting: Family Medicine

## 2024-08-25 ENCOUNTER — Encounter: Payer: Self-pay | Admitting: Oncology

## 2024-08-25 DIAGNOSIS — N3001 Acute cystitis with hematuria: Secondary | ICD-10-CM | POA: Diagnosis not present

## 2024-08-25 DIAGNOSIS — R300 Dysuria: Secondary | ICD-10-CM

## 2024-08-25 HISTORY — DX: Disorder of kidney and ureter, unspecified: N28.9

## 2024-08-25 LAB — POCT URINE DIPSTICK
Bilirubin, UA: NEGATIVE
Glucose, UA: NEGATIVE mg/dL
Ketones, POC UA: NEGATIVE mg/dL
Nitrite, UA: NEGATIVE
Protein Ur, POC: NEGATIVE mg/dL
Spec Grav, UA: <=1.005 — AB
Urobilinogen, UA: 0.2 U/dL
pH, UA: 6

## 2024-08-25 MED ORDER — CEPHALEXIN 500 MG PO CAPS: 500.0000 mg | ORAL_CAPSULE | Freq: Two times a day (BID) | ORAL | 0 refills | Status: AC

## 2024-08-25 NOTE — Progress Notes (Signed)
 Patient initial SBP taken was reported as 185. Retook at end of encounter BP 154/72 MAP 96. Patient shared when she comes to MD office it is frequently elevated.  Educated on white coat syndrome and instructed to take BP daily for at least a week same location, time of day, feet flat on the floor sitting still for5min before taking and take log into PCP. Patient verbalized back understanding.

## 2024-08-25 NOTE — Discharge Instructions (Addendum)
 You have a urinary tract infection. I sent your urine for culture to be sure the antibiotic prescribed will treat your infection. Someone may call you to change antibiotics. Stop by the pharmacy to pick up your prescriptions.  Follow up with your primary care provider or return to the urgent care, if not improving.

## 2024-08-27 LAB — URINE CULTURE: Culture: >=100000 — AB

## 2024-09-28 ENCOUNTER — Ambulatory Visit: Admitting: Podiatry

## 2024-12-21 ENCOUNTER — Inpatient Hospital Stay

## 2025-06-22 ENCOUNTER — Inpatient Hospital Stay

## 2025-06-22 ENCOUNTER — Inpatient Hospital Stay: Admitting: Oncology
